# Patient Record
Sex: Male | Born: 1955 | Race: White | Hispanic: No | Marital: Married | Smoking: Never smoker
Health system: Southern US, Community
[De-identification: ages and names within clinical notes are randomized; demographics above are authoritative.]

## PROBLEM LIST (undated history)

## (undated) DIAGNOSIS — R030 Elevated blood-pressure reading, without diagnosis of hypertension: Secondary | ICD-10-CM

## (undated) DIAGNOSIS — Z1211 Encounter for screening for malignant neoplasm of colon: Secondary | ICD-10-CM

## (undated) DIAGNOSIS — Z91018 Allergy to other foods: Secondary | ICD-10-CM

## (undated) DIAGNOSIS — E785 Hyperlipidemia, unspecified: Secondary | ICD-10-CM

## (undated) DIAGNOSIS — E66811 Obesity, class 1: Secondary | ICD-10-CM

## (undated) DIAGNOSIS — E11621 Type 2 diabetes mellitus with foot ulcer: Secondary | ICD-10-CM

## (undated) DIAGNOSIS — R972 Elevated prostate specific antigen [PSA]: Secondary | ICD-10-CM

## (undated) DIAGNOSIS — E118 Type 2 diabetes mellitus with unspecified complications: Secondary | ICD-10-CM

## (undated) DIAGNOSIS — L97509 Non-pressure chronic ulcer of other part of unspecified foot with unspecified severity: Secondary | ICD-10-CM

## (undated) DIAGNOSIS — E669 Obesity, unspecified: Secondary | ICD-10-CM

## (undated) HISTORY — DX: Allergy to other foods: Z91.018

## (undated) HISTORY — DX: Encounter for screening for malignant neoplasm of colon: Z12.11

## (undated) HISTORY — DX: Hyperlipidemia, unspecified: E78.5

## (undated) HISTORY — DX: Elevated prostate specific antigen (PSA): R97.20

## (undated) HISTORY — DX: Obesity, class 1: E66.811

## (undated) HISTORY — DX: Obesity, unspecified: E66.9

## (undated) HISTORY — PX: TONSILLECTOMY: SHX5217

## (undated) HISTORY — DX: Elevated blood-pressure reading, without diagnosis of hypertension: R03.0

## (undated) HISTORY — DX: Non-pressure chronic ulcer of other part of unspecified foot with unspecified severity: L97.509

## (undated) HISTORY — DX: Type 2 diabetes mellitus with foot ulcer: E11.621

## (undated) HISTORY — PX: ROOT CANAL: SHX2363

## (undated) HISTORY — DX: Type 2 diabetes mellitus with unspecified complications: E11.8

---

## 2010-06-13 ENCOUNTER — Encounter: Payer: Self-pay | Admitting: Family Medicine

## 2010-06-13 ENCOUNTER — Ambulatory Visit (INDEPENDENT_AMBULATORY_CARE_PROVIDER_SITE_OTHER): Payer: BC Managed Care – PPO | Admitting: Family Medicine

## 2010-06-13 VITALS — BP 151/94 | HR 84 | Temp 98.0°F | Ht 71.75 in | Wt 227.8 lb

## 2010-06-13 DIAGNOSIS — J209 Acute bronchitis, unspecified: Secondary | ICD-10-CM

## 2010-06-13 NOTE — Progress Notes (Signed)
Office Note 06/14/2010  CC:  Chief Complaint  Patient presents with  . Establish Care    new patient    HPI:  [name]. is a 55 y.o. White male who is here to establish care, discuss cough. Patient's most recent primary MD: Dr. Christell Constant at [facility] in [place], [place]. Old records were not reviewed prior to or during today's visit.  Healthy guy, feeling well until about 1 wk ago developed cough, worse in mornings, some brownish phlegm production at that time, and intermittently through the day has some "wheeziness" and a tight cough that seems to come in little "fits".  Often made worse b/c working a lot around dust lately [employer]).   No nasal congestion or mucous, no ST or HA, no fever or SOB.  Occ "chilled" feeling and mild intermittent ache in upper body, termed "feeling viral" by the patient today.  No hx of seasonal allergic rhinitis or asthmatic bronchitis/asthma.  He doesn't smoke.  No past medical history on file. No past medical problems.  Past Surgical History  Procedure Date  . Root canal   . Tonsillectomy     Family History  Problem Relation Age of Onset  . Diabetes Maternal Grandmother     type 2  . Cancer Maternal Grandfather     all over  . Arthritis Paternal Grandmother   . Heart attack Paternal Grandfather     smoke  . Alcohol abuse Paternal Grandfather     smoker and drinker  . Hodgkin's lymphoma Cousin     History   Social History  . Marital Status: Married    Spouse Name: N/A    Number of Children: N/A  . Years of Education: N/A   Occupational History  . Not on file.   Social History Main Topics  . Smoking status: Never Smoker   . Smokeless tobacco: Former Neurosurgeon    Types: Snuff    Quit date: 01/06/2000   Comment: [place]  . Alcohol Use: No  . Drug Use: No  . Sexually Active: Yes -- Male partner(s)   Other Topics Concern  . Not on file   Social History Narrative   Married, has 2 grown sons.  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  Also works in his [employer].No formal exercise regimen but very active--NOT SEDENTARY.No T/A/Ds.    Outpatient Encounter Prescriptions as of 06/13/2010  Medication Sig Dispense Refill  . multivitamin (THERAGRAN) per tablet Take 1 tablet by mouth daily.        . Omega-3 Fatty Acids (FISH OIL) 1000 MG CAPS Take 1 capsule by mouth daily.        . vitamin C (ASCORBIC ACID) 500 MG tablet Take 500 mg by mouth as needed.          No Known Allergies  ROS Review of Systems  Constitutional: Positive for fatigue (mild, with acute illness only). Negative for fever, chills and appetite change.  HENT: Negative for ear pain, congestion, sore throat, rhinorrhea, sneezing, neck stiffness and dental problem.   Eyes: Negative for discharge, redness and visual disturbance.  Respiratory: Positive for cough and wheezing. Negative for chest tightness and shortness of breath.   Cardiovascular: Negative for chest pain, palpitations and leg swelling.  Gastrointestinal: Negative for nausea, vomiting, abdominal pain, diarrhea and blood in stool.  Genitourinary: Negative for dysuria, urgency, frequency, hematuria, flank pain and difficulty urinating.  Musculoskeletal: Negative for myalgias, back pain, joint swelling and arthralgias.  Skin: Negative  for pallor and rash.  Neurological: Negative for dizziness, speech difficulty, weakness and headaches.  Hematological: Negative for adenopathy. Does not bruise/bleed easily.  Psychiatric/Behavioral: Negative for confusion and sleep disturbance. The patient is not nervous/anxious.     PE; Blood pressure 151/94, pulse 84, temperature 98 F (36.7 C), temperature source Oral, height 5' 11.75" (1.822 m), weight 227 lb 12.8 oz (103.329 kg), SpO2 96.00%. Gen: Alert, well appearing.  Patient is oriented to person, place, time, and situation. HEENT: Scalp without lesions or hair loss.  Ears: EACs clear, normal epithelium.  TMs with good light  reflex and landmarks bilaterally.  Eyes: no injection, icteris, swelling, or exudate.  EOMI, PERRLA. Nose: no drainage or turbinate edema/swelling.  No injection or focal lesion.  Mouth: lips without lesion/swelling.  Oral mucosa pink and moist.  Dentition intact and without obvious caries or gingival swelling.  Oropharynx without erythema, exudate, or swelling.  Neck: supple, ROM full.  Carotids 2+ bilat, without bruit.  No lymphadenopathy, thyromegaly, or mass. Chest: symmetric expansion, nonlabored respirations.  Clear and equal breath sounds in all lung fields. He has some post-exhalation coughing.  CV: RRR, no m/r/g.  Peripheral pulses 2+ and symmetric. EXT: no clubbing, cyanosis, or edema.   Pertinent labs:  none  ASSESSMENT AND PLAN:   Acute bronchitis Discussed usual viral etiology, symptomatic care, rest, fluids. Gave sample of symbicort 160/4.5, take 2 puffs bid until illness resolved. May continue mucinex dm as he is currently doing.  Avoid OTC decongestants.  Recheck bp at drugstore when not recently on decongestant to assure return to normal.  Return if bp remains >140s over 90s (patient reports NEVER having elevated bp in past--"usually low/normal"--and he does admit taking robitussin with decongestant recently).     Return if symptoms worsen or fail to improve.

## 2010-06-13 NOTE — Patient Instructions (Signed)
Please sign authorization for release of records from Dr. Christell Constant ([facility]).

## 2010-06-14 ENCOUNTER — Encounter: Payer: Self-pay | Admitting: Family Medicine

## 2010-06-14 NOTE — Assessment & Plan Note (Signed)
Discussed usual viral etiology, symptomatic care, rest, fluids. Gave sample of symbicort 160/4.5, take 2 puffs bid until illness resolved. May continue mucinex dm as he is currently doing.  Avoid OTC decongestants.  Recheck bp at drugstore when not recently on decongestant to assure return to normal.  Return if bp remains >140s over 90s (patient reports NEVER having elevated bp in past--"usually low/normal"--and he does admit taking robitussin with decongestant recently).

## 2010-08-07 ENCOUNTER — Ambulatory Visit (INDEPENDENT_AMBULATORY_CARE_PROVIDER_SITE_OTHER): Payer: BC Managed Care – PPO | Admitting: Family Medicine

## 2010-08-07 ENCOUNTER — Encounter: Payer: Self-pay | Admitting: Family Medicine

## 2010-08-07 VITALS — BP 147/90 | HR 94 | Temp 98.2°F | Ht 71.75 in | Wt 233.0 lb

## 2010-08-07 DIAGNOSIS — L02619 Cutaneous abscess of unspecified foot: Secondary | ICD-10-CM

## 2010-08-07 DIAGNOSIS — L0390 Cellulitis, unspecified: Secondary | ICD-10-CM

## 2010-08-07 MED ORDER — MUPIROCIN 2 % EX OINT: TOPICAL_OINTMENT | Freq: Three times a day (TID) | CUTANEOUS | Status: AC

## 2010-08-07 MED ORDER — AMOXICILLIN-POT CLAVULANATE 875-125 MG PO TABS: 1.0000 | ORAL_TABLET | Freq: Two times a day (BID) | ORAL | Status: AC

## 2010-08-08 NOTE — Progress Notes (Signed)
OFFICE NOTE  08/08/2010  CC:  Chief Complaint  Patient presents with  . Toe Injury     HPI:   Patient is a 55 y.o. Caucasian male who is here for right great toe pain. Has no history of any chronic medical problems.  Has noted a large painless callus on right great toe the last few months.  Feet wet a lot lately, wearing wet shoes, too---the callus had began to swell and partially fall off so he cut it.  This revealed pinkish/grey skin beneath and there was no pus beneath the portion he cut off.  No foul odor.  Shortly after he cut off a portion of the callus, the great toe began to hurt and swell and turn red (over the last 24h or so).  Says sensation in foot is normal.  No fever, no malaise, no fatigue.  Pertinent PMH:  No hx of MRSA or other skin infection.  No hx of DM 2, no hx of neuropathy.  MEDS;   Outpatient Prescriptions Prior to Visit  Medication Sig Dispense Refill  . multivitamin (THERAGRAN) per tablet Take 1 tablet by mouth daily.        . Omega-3 Fatty Acids (FISH OIL) 1000 MG CAPS Take 1 capsule by mouth daily.        . vitamin C (ASCORBIC ACID) 500 MG tablet Take 500 mg by mouth as needed.          PE: Blood pressure 147/90, pulse 94, temperature 98.2 F (36.8 C), temperature source Oral, height 5' 11.75" (1.822 m), weight 233 lb (105.688 kg), SpO2 97.00%. Gen: Alert, well appearing.  Patient is oriented to person, place, time, and situation. Right foot: great toe with large callus that is quite thick (about 3-4 mm)--the majority of this has been excised by the patient, leaving a pinkish base that has no exudate or odor.  He has erythema, swelling, and mild tenderness to the majority of the great toe and the subtle erythema extends up the medial surface of the foot and the ankle.  I don't feel any fluctuance in the tissue of the great toe.  He moves the toe fine.  IMPRESSION AND PLAN:  Cellulitis Start augmentin 875mg  bid x 10d, bactroban ointment tid, epsom salt  soaks bid, rest. Discussed warning signs of worsening illness. Recheck 4d.     FOLLOW UP:  Return in about 4 days (around 08/11/2010) for f/u toe wound and infection.

## 2010-08-08 NOTE — Assessment & Plan Note (Signed)
Start augmentin 875mg  bid x 10d, bactroban ointment tid, epsom salt soaks bid, rest. Discussed warning signs of worsening illness. Recheck 4d.

## 2010-08-11 ENCOUNTER — Encounter: Payer: Self-pay | Admitting: Family Medicine

## 2010-08-11 ENCOUNTER — Ambulatory Visit (INDEPENDENT_AMBULATORY_CARE_PROVIDER_SITE_OTHER): Payer: BC Managed Care – PPO | Admitting: Family Medicine

## 2010-08-11 VITALS — BP 144/96 | HR 84 | Ht 71.75 in | Wt 233.0 lb

## 2010-08-11 DIAGNOSIS — L0390 Cellulitis, unspecified: Secondary | ICD-10-CM

## 2010-08-11 DIAGNOSIS — L0291 Cutaneous abscess, unspecified: Secondary | ICD-10-CM

## 2010-08-11 NOTE — Progress Notes (Signed)
OFFICE NOTE  08/11/2010  CC:  Chief Complaint  Patient presents with  . Cellulitis    right great toe     HPI: Patient is a 55 y.o. Caucasian male who is here for f/u right great toe infection. Has been on augmentin 3 full days and has noted significant improvement in pain, redness, and swelling.  Also applying bactroban tid and soaking in epsom salt. Tolerating meds fine.  No fever, no malaise.  Pertinent PMH:  Elevated bp w/out dx of HTN  Pertinent Meds: Augmentin and bactroban ointment PE: Blood pressure 144/96, pulse 84, height 5' 11.75" (1.822 m), weight 233 lb (105.688 kg). Gen: Alert, well appearing.  Patient is oriented to person, place, time, and situation. Right great toe: mild erythema laterally but no swelling.  No erythema on remainder of foot or ankle.   Shallow ulceration with moist bed on plantar surface of great toe, some granulation tissue present.  No active exudate, no foul odor. Large callus still present at periphery of wound.  IMPRESSION AND PLAN: Improving soft tissue infection of right great toe. I used a scalpel to cut away the remainder of his callus today. I encouraged him to continue everything he is currently doing. He'll also monitor his bp daily and record and bring to f/u in 1 wk.  FOLLOW UP: 1 wk

## 2010-09-09 ENCOUNTER — Ambulatory Visit (INDEPENDENT_AMBULATORY_CARE_PROVIDER_SITE_OTHER): Payer: BC Managed Care – PPO | Admitting: Family Medicine

## 2010-09-09 ENCOUNTER — Encounter: Payer: Self-pay | Admitting: Family Medicine

## 2010-09-09 VITALS — BP 148/91 | HR 98 | Temp 98.3°F | Wt 230.0 lb

## 2010-09-09 DIAGNOSIS — L089 Local infection of the skin and subcutaneous tissue, unspecified: Secondary | ICD-10-CM

## 2010-09-09 MED ORDER — AMOXICILLIN-POT CLAVULANATE 500-125 MG PO TABS: 1.0000 | ORAL_TABLET | Freq: Three times a day (TID) | ORAL | Status: DC

## 2010-09-10 ENCOUNTER — Other Ambulatory Visit: Payer: Self-pay | Admitting: Family Medicine

## 2010-09-10 ENCOUNTER — Telehealth: Payer: Self-pay | Admitting: Family Medicine

## 2010-09-10 DIAGNOSIS — L089 Local infection of the skin and subcutaneous tissue, unspecified: Secondary | ICD-10-CM

## 2010-09-10 NOTE — Telephone Encounter (Signed)
Pls call pt and tell him I need him to get some blood work prior to his MRI (he needs contrast, so radiology requires recent creatinine). [name] is here this morning but is out this afternoon.  If he wants to get the blood work at a different [place] location this afternoon that is fine.  I'll enter orders.--PM

## 2010-09-10 NOTE — Progress Notes (Signed)
OFFICE NOTE  09/10/2010  CC: No chief complaint on file.    HPI:   Patient is a 55 y.o. Caucasian male who is here for ongoing right great toe ulcer. Feels well, has minimal discomfort with weight bearing/walking. No fever.  The circular ulcer is not draining purulent fluid and there is no foul odor, but this area still is not sealed up. Took a course of augmentin 875mg  bid.  Pertinent PMH:  No DM 2. No hx of MRSA or other soft tissue infection prior to this. No hx of immunodeficiency.  MEDS;   Outpatient Prescriptions Prior to Visit  Medication Sig Dispense Refill  . Omega-3 Fatty Acids (FISH OIL) 1000 MG CAPS Take 1 capsule by mouth daily.        . Pediatric Multiple Vit-C-FA (FRUITY CHEWABLES MULTIVITAMIN PO) Take 1 each by mouth daily.        . vitamin C (ASCORBIC ACID) 500 MG tablet Take 500 mg by mouth as needed.          PE: Blood pressure 148/91, pulse 98, temperature 98.3 F (36.8 C), temperature source Oral, weight 230 lb (104.327 kg). Gen: Alert, well appearing.  Patient is oriented to person, place, time, and situation. LEGS: no edema.   Right foot with minimal focal blanching erythema on dorsal surface of great toe (wear shoe has rubbed lately b/c he has gone back to wearing sneakers instead of flip flops). Nontender.  Thick callous on lateral and plantar surfaces of great toe.  There is a clean, oval ulcer about 1 cm in diameter on plantar surface of right great toe.  No active drainage, no foul odor. I can probe with a Q-tip down about 1cm to pinkish-grey sub Q tissue.    IMPRESSION AND PLAN:  Toe infection Present for 1 month now: improved superficially, but he has a nonhealing ulcer/drainage tract. I feel that he needs more definitive/deeper soft tissue debridement at wound care clinic--referred to [facility] [place] clinic. Also, will check MRI of toe to exclude osteomyelitis. F/u to be determined based on wound clinic findings/procedures/MRI results. Will continue  augmentin 500mg  bid x 7d.     FOLLOW UP:  Return if symptoms worsen or fail to improve.

## 2010-09-10 NOTE — Telephone Encounter (Signed)
I have attempted to contact this patient by phone with the following results: left message to return my call on answering machine (mobile).  Advised to call [place] office, [phone].

## 2010-09-10 NOTE — Assessment & Plan Note (Addendum)
Present for 1 month now: improved superficially, but he has a nonhealing ulcer/drainage tract. I feel that he needs more definitive/deeper soft tissue debridement at wound care clinic--referred to [facility] clinic. Also, will check MRI of toe to exclude osteomyelitis. F/u to be determined based on wound clinic findings/procedures/MRI results. Will continue augmentin 500mg  bid x 7d.

## 2010-09-11 NOTE — Telephone Encounter (Signed)
I spoke to pt and he will come on 09/12/10 for labs.  Appt entered and orders previously entered by Dr. Milinda Cave.

## 2010-09-12 ENCOUNTER — Other Ambulatory Visit (INDEPENDENT_AMBULATORY_CARE_PROVIDER_SITE_OTHER): Payer: BC Managed Care – PPO

## 2010-09-12 ENCOUNTER — Other Ambulatory Visit: Payer: Self-pay | Admitting: Family Medicine

## 2010-09-12 DIAGNOSIS — L089 Local infection of the skin and subcutaneous tissue, unspecified: Secondary | ICD-10-CM

## 2010-09-12 DIAGNOSIS — E118 Type 2 diabetes mellitus with unspecified complications: Secondary | ICD-10-CM

## 2010-09-12 HISTORY — DX: Type 2 diabetes mellitus with unspecified complications: E11.8

## 2010-09-12 LAB — BASIC METABOLIC PANEL
CO2: 24 mEq/L (ref 19–32)
Calcium: 9.2 mg/dL (ref 8.4–10.5)
Creat: 0.78 mg/dL (ref 0.50–1.35)
Sodium: 137 mEq/L (ref 135–145)

## 2010-09-12 LAB — CBC WITH DIFFERENTIAL/PLATELET
Basophils Absolute: 0 10*3/uL (ref 0.0–0.1)
Basophils Relative: 0 % (ref 0–1)
Eosinophils Absolute: 0.1 10*3/uL (ref 0.0–0.7)
Hemoglobin: 14 g/dL (ref 13.0–17.0)
MCH: 27.2 pg (ref 26.0–34.0)
MCHC: 33.4 g/dL (ref 30.0–36.0)
Neutro Abs: 5.8 10*3/uL (ref 1.7–7.7)
Neutrophils Relative %: 61 % (ref 43–77)
Platelets: 223 10*3/uL (ref 150–400)
RDW: 13.9 % (ref 11.5–15.5)

## 2010-09-12 LAB — SEDIMENTATION RATE: Sed Rate: 11 mm/hr (ref 0–16)

## 2010-09-13 ENCOUNTER — Ambulatory Visit (HOSPITAL_BASED_OUTPATIENT_CLINIC_OR_DEPARTMENT_OTHER)
Admission: RE | Admit: 2010-09-13 | Discharge: 2010-09-13 | Disposition: A | Discharge status: Home / Self Care | Payer: BC Managed Care – PPO | Source: Ambulatory Visit | Attending: Family Medicine | Admitting: Family Medicine

## 2010-09-13 ENCOUNTER — Encounter: Payer: Self-pay | Admitting: Family Medicine

## 2010-09-13 DIAGNOSIS — L97509 Non-pressure chronic ulcer of other part of unspecified foot with unspecified severity: Secondary | ICD-10-CM | POA: Insufficient documentation

## 2010-09-13 DIAGNOSIS — L03039 Cellulitis of unspecified toe: Secondary | ICD-10-CM | POA: Insufficient documentation

## 2010-09-13 DIAGNOSIS — R739 Hyperglycemia, unspecified: Secondary | ICD-10-CM | POA: Insufficient documentation

## 2010-09-13 DIAGNOSIS — L02619 Cutaneous abscess of unspecified foot: Secondary | ICD-10-CM | POA: Insufficient documentation

## 2010-09-13 DIAGNOSIS — M948X9 Other specified disorders of cartilage, unspecified sites: Secondary | ICD-10-CM

## 2010-09-13 DIAGNOSIS — L089 Local infection of the skin and subcutaneous tissue, unspecified: Secondary | ICD-10-CM

## 2010-09-13 DIAGNOSIS — M19079 Primary osteoarthritis, unspecified ankle and foot: Secondary | ICD-10-CM | POA: Insufficient documentation

## 2010-09-13 MED ORDER — GADOBENATE DIMEGLUMINE 529 MG/ML IV SOLN
20.0000 mL | Freq: Once | INTRAVENOUS | Status: AC | PRN
  Administered 2010-09-13: 20 mL via INTRAVENOUS

## 2010-09-13 NOTE — Progress Notes (Signed)
Quick Note:  Spoke with Natalie at [facility] and added on HbA1c to labs from 09/12/10 (dx hyperglycemia). Will call pt and notify him of result and discuss f/u.--PM ______

## 2010-09-14 LAB — HEMOGLOBIN A1C
Hgb A1c MFr Bld: 12.6 % — ABNORMAL HIGH (ref <5.7)
Mean Plasma Glucose: 315 mg/dL — ABNORMAL HIGH (ref <117)

## 2010-09-15 ENCOUNTER — Ambulatory Visit (INDEPENDENT_AMBULATORY_CARE_PROVIDER_SITE_OTHER): Payer: BC Managed Care – PPO | Admitting: Family Medicine

## 2010-09-15 ENCOUNTER — Encounter: Payer: Self-pay | Admitting: Family Medicine

## 2010-09-15 VITALS — Wt 227.0 lb

## 2010-09-15 DIAGNOSIS — L089 Local infection of the skin and subcutaneous tissue, unspecified: Secondary | ICD-10-CM

## 2010-09-15 DIAGNOSIS — E119 Type 2 diabetes mellitus without complications: Secondary | ICD-10-CM

## 2010-09-15 MED ORDER — AMOXICILLIN-POT CLAVULANATE 500-125 MG PO TABS: 1.0000 | ORAL_TABLET | Freq: Three times a day (TID) | ORAL | Status: DC

## 2010-09-15 MED ORDER — INSULIN DETEMIR 100 UNIT/ML ~~LOC~~ SOLN: SUBCUTANEOUS | Status: DC

## 2010-09-15 NOTE — Assessment & Plan Note (Signed)
Stable. Finish 5 more days of augmentin--rx done. Keep initial wound clinic appt for later this week (friday).

## 2010-09-15 NOTE — Progress Notes (Signed)
OFFICE NOTE  09/15/2010  CC:  Chief Complaint  Patient presents with  . Diabetes    education     HPI:   Patient is a 55 y.o. Caucasian male who is here to discuss recent dx of DM 2. Random glucose 3d/a was 256.  HbA1c was 12.6%.  He denies polyuria, polydipsia, or polyphagia. He has had a poorly healing right great toe wound, has wound clinic initial appt in 4d.  This wound has improved some since last visit: less red, less swollen. He finishes latest augmentin rx today b/c he's been taking tid but rx says bid.    Discussed general DM 2 concepts today, including monitoring of glucoses, basic dietary recommendations, exercise, and also reviewed basic treatments.  Pertinent PMH:  No significant medical problems except recent toe infection/ulcer. MEDS;   Outpatient Prescriptions Prior to Visit  Medication Sig Dispense Refill  . Omega-3 Fatty Acids (FISH OIL) 1000 MG CAPS Take 1 capsule by mouth daily.        . Pediatric Multiple Vit-C-FA (FRUITY CHEWABLES MULTIVITAMIN PO) Take 1 each by mouth daily.        . vitamin C (ASCORBIC ACID) 500 MG tablet Take 500 mg by mouth as needed.        Marland Kitchen amoxicillin-clavulanate (AUGMENTIN) 500-125 MG per tablet Take 1 tablet (500 mg total) by mouth 3 (three) times daily.  21 tablet  0    PE: Weight 227 lb (102.967 kg). Gen: Alert, well appearing.  Patient is oriented to person, place, time, and situation. Right great toe: nonerythematous, nontender callous on plantar surface, with approx 5mm oval ulceration that extends to subq tissue.  No active drainage, no foul smell.  Sensation intact.  IMPRESSION AND PLAN:  Type II or unspecified type diabetes mellitus without mention of complication, not stated as uncontrolled Some education was done today. Referral for nutritionist ordered today. Recommended starting with bid glucose checks, start diabetic diet, start levemir 10 units SQ qhs and titrate up by 1 unit per night until fasting glucose in  100-110 range. F/u with glucose log in 2 wks. Nurse taught pt how to use glucometer today, random glucose check done (260s), taught pt how to inject insulin---gave 2 levemir pen samples + 14 needles.  Toe infection Stable. Finish 5 more days of augmentin--rx done. Keep initial wound clinic appt for later this week (friday).     FOLLOW UP:  Return in about 2 weeks (around 09/29/2010) for f/u dm 2.

## 2010-09-15 NOTE — Assessment & Plan Note (Signed)
Some education was done today. Referral for nutritionist ordered today. Recommended starting with bid glucose checks, start diabetic diet, start levemir 10 units SQ qhs and titrate up by 1 unit per night until fasting glucose in 100-110 range. F/u with glucose log in 2 wks. Nurse taught pt how to use glucometer today, random glucose check done (260s), taught pt how to inject insulin---gave 2 levemir pen samples + 14 needles.

## 2010-09-19 ENCOUNTER — Encounter (HOSPITAL_BASED_OUTPATIENT_CLINIC_OR_DEPARTMENT_OTHER): Payer: BC Managed Care – PPO | Attending: General Surgery

## 2010-09-19 DIAGNOSIS — E1169 Type 2 diabetes mellitus with other specified complication: Secondary | ICD-10-CM | POA: Insufficient documentation

## 2010-09-19 DIAGNOSIS — L97509 Non-pressure chronic ulcer of other part of unspecified foot with unspecified severity: Secondary | ICD-10-CM | POA: Insufficient documentation

## 2010-09-19 DIAGNOSIS — L03039 Cellulitis of unspecified toe: Secondary | ICD-10-CM | POA: Insufficient documentation

## 2010-09-19 DIAGNOSIS — Z79899 Other long term (current) drug therapy: Secondary | ICD-10-CM | POA: Insufficient documentation

## 2010-09-19 DIAGNOSIS — L02619 Cutaneous abscess of unspecified foot: Secondary | ICD-10-CM | POA: Insufficient documentation

## 2010-09-19 DIAGNOSIS — L84 Corns and callosities: Secondary | ICD-10-CM | POA: Insufficient documentation

## 2010-09-22 ENCOUNTER — Other Ambulatory Visit: Payer: Self-pay | Admitting: *Deleted

## 2010-09-22 DIAGNOSIS — E119 Type 2 diabetes mellitus without complications: Secondary | ICD-10-CM

## 2010-09-22 MED ORDER — BD LANCET DEVICE MISC: Status: DC

## 2010-09-22 MED ORDER — GLUCOSE BLOOD VI STRP: ORAL_STRIP | Status: DC

## 2010-09-22 MED ORDER — GLUCOSE BLOOD VI STRP
ORAL_STRIP | Status: DC
Start: 2010-09-22 — End: 2010-09-22

## 2010-09-22 NOTE — Telephone Encounter (Signed)
Pt presents at office out of test strips and stating that lancet device does not work well.  Advised we would call these in.  Pt request [facility]

## 2010-09-29 ENCOUNTER — Encounter: Payer: Self-pay | Admitting: Family Medicine

## 2010-09-29 ENCOUNTER — Encounter: Payer: BC Managed Care – PPO | Attending: Family Medicine | Admitting: *Deleted

## 2010-09-29 ENCOUNTER — Encounter: Payer: Self-pay | Admitting: *Deleted

## 2010-09-29 ENCOUNTER — Ambulatory Visit (INDEPENDENT_AMBULATORY_CARE_PROVIDER_SITE_OTHER): Payer: BC Managed Care – PPO | Admitting: Family Medicine

## 2010-09-29 DIAGNOSIS — E118 Type 2 diabetes mellitus with unspecified complications: Secondary | ICD-10-CM | POA: Insufficient documentation

## 2010-09-29 DIAGNOSIS — L02619 Cutaneous abscess of unspecified foot: Secondary | ICD-10-CM

## 2010-09-29 DIAGNOSIS — Z713 Dietary counseling and surveillance: Secondary | ICD-10-CM | POA: Insufficient documentation

## 2010-09-29 DIAGNOSIS — L089 Local infection of the skin and subcutaneous tissue, unspecified: Secondary | ICD-10-CM

## 2010-09-29 DIAGNOSIS — E119 Type 2 diabetes mellitus without complications: Secondary | ICD-10-CM | POA: Insufficient documentation

## 2010-09-29 MED ORDER — INSULIN PEN NEEDLE 32G X 6 MM MISC: Status: DC

## 2010-09-29 NOTE — Patient Instructions (Addendum)
Goals:  Follow Diabetes Meal Plan as instructed  Eat 3 meals and 1-3 snacks, every 3-5 hrs  Limit carbohydrate intake to 45-60 grams carbohydrate/meal  Limit carbohydrate intake to 30 grams carbohydrate/snack as needed  Add lean protein foods to meals/snacks  Monitor glucose levels as instructed by your doctor  Aim for 15-30 mins of physical activity daily as able with cast on foot  Call for appointment as needed for your next nutrition visit

## 2010-09-29 NOTE — Progress Notes (Signed)
  Medical Nutrition Therapy:  Appt start time: 11:45 end time:  12:45  Assessment:  Primary concerns today:Newly diagnosed Type 2 Diabetes nutrition counselling    MEDICATIONS: Levemir started at 10 units in evening to be increased by 1 unit per day as needed until FBG is 100-110 mg/dl   DIETARY INTAKE:  Usual eating pattern includes 3 meals and 1-3 snacks per day.  Everyday foods include cereal, milk fruit and variety of all food groups at all meals.     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  Estimated energy needs:2500 - 500 for wt loss of 1# per week = 2000 calories 2000 calories 225 g carbohydrates 150 g protein 56 g fat    Progress Towards Goal(s):  In progress.   Nutritional Diagnosis:  NI-5.8.2 Excessive carbohydrate intake As related to HgA1c of 12% upon diagnosis 1 month ago.  As evidenced by no previous diabetes education..    Intervention:  Nutrition Education.  Handouts given during visit include:  Carb Counting booklet by [employer]    Monitoring/Evaluation:  Dietary intake, exercise, BG Log, and body weight prn. Patient and wife expressed good verbal understanding of carbohydrate as it relates to blood glucose and the importance of spreading carbs evenly throughout the day.

## 2010-09-29 NOTE — Progress Notes (Signed)
OFFICE NOTE  09/29/2010  CC:  Chief Complaint  Patient presents with  . Diabetes    2 week follow up     HPI:   Patient is a 55 y.o. Caucasian male who is here for DM 2 follow up. Has been on lantus now for 2 wks after initial dx of DM 2 last visit, checking one fasting gluc and one 2H PP gluc daily. Glucoses normalizing nicely, currently on 17 U qhs (lowest gluc 109.  Last 4d his range of fastings has been 120-135).  No hypoglycemia.   Adjusting diet nicely but also has first nutritionist visit later today. Unable to exercise much except the exertion he must use to walk w/crutches. Toe improving, has been going to wound care center, finishing up augmentin.  Pertinent PMH:  Newly dx'd DM 2 Slow healing right great toe ulcer and infection  MEDS;   Outpatient Prescriptions Prior to Visit  Medication Sig Dispense Refill  . amoxicillin-clavulanate (AUGMENTIN) 500-125 MG per tablet Take 1 tablet (500 mg total) by mouth 3 (three) times daily.  18 tablet  0  . glucose blood (FREESTYLE LITE) test strip Use to check blood sugar three times a day as directed.  100 each  2  . insulin detemir (LEVEMIR) 100 UNIT/ML injection 10 units subQ every night at bedtime, increase by 1 unit every night until fasting glucose is in the range of 100-110  3 mL  12  . Lancet Devices (B-D LANCET DEVICE) MISC Use as directed to check blood sugars  1 each  1  . Omega-3 Fatty Acids (FISH OIL) 1000 MG CAPS Take 1 capsule by mouth daily.        . vitamin C (ASCORBIC ACID) 500 MG tablet Take 500 mg by mouth as needed.        . Pediatric Multiple Vit-C-FA (FRUITY CHEWABLES MULTIVITAMIN PO) Take 1 each by mouth daily.          PE: Blood pressure 144/86, pulse 87, temperature 98.5 F (36.9 C), temperature source Oral, resp. rate 16, SpO2 97.00%. Gen: Alert, well appearing.  Patient is oriented to person, place, time, and situation. Large cast on RLE.  No further exam today.  IMPRESSION AND PLAN:  Type II or  unspecified type diabetes mellitus without mention of complication, uncontrolled Improving. Continue to titrate Lantus to get fasting gluc to goal range of 100-110 consistently. Nutritionist today.  Gets cast off in a few weeks so will likely increase exercise at that time. Continue bid gluc checks for now. Anticipate switching over from insulin to metformin in 4 wks or so. F/u 1 mo.  Toe infection Improving. No further abx at this time. Ulcer healing going well--continue wound care clinic follow up.   15 minutes spent with patient today, with >50% of that time spent in counseling and care coordination for DM 2 and his toe wound.  FOLLOW UP:  Return in about 1 month (around 10/29/2010) for f/u toe wound and diabetes.

## 2010-09-29 NOTE — Assessment & Plan Note (Signed)
Improving. Continue to titrate Lantus to get fasting gluc to goal range of 100-110 consistently. Nutritionist today.  Gets cast off in a few weeks so will likely increase exercise at that time. Continue bid gluc checks for now. Anticipate switching over from insulin to metformin in 4 wks or so. F/u 1 mo.

## 2010-09-29 NOTE — Assessment & Plan Note (Signed)
Improving. No further abx at this time. Ulcer healing going well--continue wound care clinic follow up.

## 2010-10-02 ENCOUNTER — Ambulatory Visit: Payer: BC Managed Care – PPO | Admitting: *Deleted

## 2010-10-10 ENCOUNTER — Encounter (HOSPITAL_BASED_OUTPATIENT_CLINIC_OR_DEPARTMENT_OTHER): Payer: BC Managed Care – PPO | Attending: General Surgery

## 2010-10-10 DIAGNOSIS — E1169 Type 2 diabetes mellitus with other specified complication: Secondary | ICD-10-CM | POA: Insufficient documentation

## 2010-10-10 DIAGNOSIS — L03039 Cellulitis of unspecified toe: Secondary | ICD-10-CM | POA: Insufficient documentation

## 2010-10-10 DIAGNOSIS — L97509 Non-pressure chronic ulcer of other part of unspecified foot with unspecified severity: Secondary | ICD-10-CM | POA: Insufficient documentation

## 2010-10-10 DIAGNOSIS — Z79899 Other long term (current) drug therapy: Secondary | ICD-10-CM | POA: Insufficient documentation

## 2010-10-10 DIAGNOSIS — L84 Corns and callosities: Secondary | ICD-10-CM | POA: Insufficient documentation

## 2010-10-10 DIAGNOSIS — L02619 Cutaneous abscess of unspecified foot: Secondary | ICD-10-CM | POA: Insufficient documentation

## 2010-10-16 ENCOUNTER — Telehealth: Payer: Self-pay | Admitting: Family Medicine

## 2010-10-16 NOTE — Telephone Encounter (Signed)
2 samples of levemir given to [facility] who gave to patient.

## 2010-10-27 ENCOUNTER — Encounter: Payer: Self-pay | Admitting: Family Medicine

## 2010-10-27 ENCOUNTER — Ambulatory Visit (INDEPENDENT_AMBULATORY_CARE_PROVIDER_SITE_OTHER): Payer: BC Managed Care – PPO | Admitting: Family Medicine

## 2010-10-27 DIAGNOSIS — L089 Local infection of the skin and subcutaneous tissue, unspecified: Secondary | ICD-10-CM

## 2010-10-27 MED ORDER — METFORMIN HCL 500 MG PO TABS: ORAL_TABLET | ORAL | Status: DC

## 2010-10-27 NOTE — Assessment & Plan Note (Signed)
Change to metformin 500mg  bid and d/c lantus.  Therapeutic expectations and side effect profile of medication discussed today.  Patient's questions answered. Titrate metformin to 1000 mg bid after 1 wk if needed.  Also may add back hs levemir as needed in future if max metformin not adequate. Continue bid glucose checks. Will check FLP, HbA1c, and urine microalbumin/cr at next f/u in 71mo. Podiatrist referral for foot/nail care in near future.

## 2010-10-27 NOTE — Assessment & Plan Note (Signed)
Healed.  Just a callous present today. Wear diabetic shoes (currently has them on!).

## 2010-10-27 NOTE — Progress Notes (Signed)
OFFICE NOTE  10/27/2010  CC:  Chief Complaint  Patient presents with  . Follow-up    DM, HTN     HPI:   Patient is a 55 y.o. Caucasian male who is here for f/u DM 2. Glucoses have trended down to near normal fasting : 110-140s.  Lantus max has been 17 U qhs. Saw nutritionist and has started making some adjustments. No symptoms/problems today.  Says he was released from wound care clinic last week b/c his ulcer healed!  Pertinent PMH:  DM 2, dx'd 09/2010 Elevated bp w/out dx of HTN Slow healing right great toe ulcer--now healed.  MEDS;   Outpatient Prescriptions Prior to Visit  Medication Sig Dispense Refill  . glucose blood (FREESTYLE LITE) test strip Use to check blood sugar three times a day as directed.  100 each  2  . insulin detemir (LEVEMIR) 100 UNIT/ML injection Inject 10 Units into the skin at bedtime. 10 units subQ every night at bedtime, increase by 1 unit every night until fasting glucose is in the range of 100-110       . Insulin Pen Needle 32G X 6 MM MISC 1 injection daily  30 each  6  . Lancet Devices (B-D LANCET DEVICE) MISC Use as directed to check blood sugars  1 each  1  . Multiple Vitamins-Minerals (CENTRUM PO) Take 1 tablet by mouth daily.        . Omega-3 Fatty Acids (FISH OIL) 1000 MG CAPS Take 1 capsule by mouth daily.        . vitamin C (ASCORBIC ACID) 500 MG tablet Take 500 mg by mouth as needed.        Marland Kitchen amoxicillin-clavulanate (AUGMENTIN) 500-125 MG per tablet Take 1 tablet (500 mg total) by mouth 3 (three) times daily.  18 tablet  0    PE: Blood pressure 138/80, pulse 76, weight 223 lb (101.152 kg), SpO2 97.00%. Gen: Alert, well appearing.  Patient is oriented to person, place, time, and situation. No further exam today.  IMPRESSION AND PLAN:  Type II or unspecified type diabetes mellitus without mention of complication, uncontrolled Change to metformin 500mg  bid and d/c lantus.  Therapeutic expectations and side effect profile of medication  discussed today.  Patient's questions answered. Titrate metformin to 1000 mg bid after 1 wk if needed.  Also may add back hs levemir as needed in future if max metformin not adequate. Continue bid glucose checks. Will check FLP, HbA1c, and urine microalbumin/cr at next f/u in 70mo. Podiatrist referral for foot/nail care in near future.  Toe infection Healed.  Just a callous present today. Wear diabetic shoes (currently has them on!).     FOLLOW UP:  Return in about 2 months (around 12/27/2010) for f/u DM 2.

## 2010-11-24 ENCOUNTER — Other Ambulatory Visit: Payer: Self-pay | Admitting: Family Medicine

## 2010-11-24 NOTE — Telephone Encounter (Signed)
Follow up on 12/26/10 for DM2.  RX sent to pharm. Pt also left voicemail requesting refill.

## 2010-11-24 NOTE — H&P (Signed)
  NAME:  DEVINE, KLINGEL NO.:  [id number]  MEDICAL RECORD NO.:  [id number]  LOCATION:  FOOT                         FACILITY:  [facility]  PHYSICIAN:  Ardath Sax, M.D.     DATE OF BIRTH:  [birth year]  DATE OF ADMISSION:  09/19/2010 DATE OF DISCHARGE:                             HISTORY & PHYSICAL   [name] is a 55 year old new diabetic who has a family history of type 2 diabetes, but developed a plantar ulcer on his right great toe which prompted him to go to the doctor.  He was noted to have elevated blood sugars and had an A1c of 12.  He has been under treatment now for several weeks because of his ulcer and cellulitis of the toe, they put him on Augmentin and the swelling and cellulitis of the toe has gone down greatly.  He also takes Levemir 10 units subcu every night and his blood sugar today was 160.  His white count is 9000, his hemoglobin 14. This is a very active man who runs a fishing company at [employer] and is on his feet all the feet time.  He is athlete and played football in college and he is quite concerned about his diabetes and his toe. Today, we debrided some callus off.  I felt like we ought to keep him on the Augmentin and have him come back with the idea that we putting on a total contact cast, so we will very give him a diagnosis of diabetic foot ulcer plantar aspect right great toe with surrounding cellulitis. He had a little callus debrided today and he will return next week for a total contact cast.     Ardath Sax, M.D.     PP/MEDQ  D:  09/19/2010  T:  09/19/2010  Job:  [id number]  Electronically Signed by Ardath Sax  on 11/24/2010 03:02:40 PM

## 2010-12-26 ENCOUNTER — Encounter: Payer: Self-pay | Admitting: Family Medicine

## 2010-12-26 ENCOUNTER — Ambulatory Visit (INDEPENDENT_AMBULATORY_CARE_PROVIDER_SITE_OTHER): Payer: BC Managed Care – PPO | Admitting: Family Medicine

## 2010-12-26 DIAGNOSIS — Z23 Encounter for immunization: Secondary | ICD-10-CM

## 2010-12-26 DIAGNOSIS — R030 Elevated blood-pressure reading, without diagnosis of hypertension: Secondary | ICD-10-CM

## 2010-12-26 DIAGNOSIS — E119 Type 2 diabetes mellitus without complications: Secondary | ICD-10-CM

## 2010-12-26 DIAGNOSIS — IMO0001 Reserved for inherently not codable concepts without codable children: Secondary | ICD-10-CM

## 2010-12-26 DIAGNOSIS — I10 Essential (primary) hypertension: Secondary | ICD-10-CM | POA: Insufficient documentation

## 2010-12-26 HISTORY — DX: Elevated blood-pressure reading, without diagnosis of hypertension: R03.0

## 2010-12-26 LAB — MICROALBUMIN / CREATININE URINE RATIO: Microalb Creat Ratio: 1.8 mg/g (ref 0.0–30.0)

## 2010-12-26 LAB — LIPID PANEL
LDL Cholesterol: 105 mg/dL — ABNORMAL HIGH (ref 0–99)
Total CHOL/HDL Ratio: 4

## 2010-12-26 LAB — COMPREHENSIVE METABOLIC PANEL
ALT: 33 U/L (ref 0–53)
AST: 27 U/L (ref 0–37)
Albumin: 4.1 g/dL (ref 3.5–5.2)
Calcium: 9 mg/dL (ref 8.4–10.5)
Chloride: 103 mEq/L (ref 96–112)
Potassium: 3.8 mEq/L (ref 3.5–5.1)
Sodium: 141 mEq/L (ref 135–145)
Total Protein: 7.4 g/dL (ref 6.0–8.3)

## 2010-12-26 NOTE — Assessment & Plan Note (Signed)
Likely secondary to frequent use of OTC decongestants lately due to his flu-like illness. Warned pt to stay away from any OTC cold med that has the work DECONGESTANT on it. REcheck of bp today in exam room after 15 min was 142/90 in right arm.

## 2010-12-26 NOTE — Progress Notes (Addendum)
OFFICE NOTE  12/26/2010  CC:  Chief Complaint  Patient presents with  . Diabetes    follow up     HPI:   Patient is a 55 y.o. Caucasian male who is here for 33mo DM 2 follow up. Last visit we switched him over from levemir insulin at bedtime to metformin 500 mg bid. Has not increased to 1000mg  bid despite glucoses still being 140s typically when fasting, sometimes higher later in the day.  He has had a flu-like illness the last week or so, feeling much better the last 2d: had fever, cough, body aches, fatigue.  Wife had it before him. He has not had flu vaccine this season--says a nurse at [facility] recommended against it when he was there once for his toe infection. He is fasting today.  Says his right great toe continues to improve: some black callus/eschar is still slowly sloughing off.  Reports chronic feeling of tingling in tips of both feet: no change.  No vision complaints.  Has not seen eye MD for diab retinpthy screen since dx of DM.   Pertinent PMH:  Past Medical History  Diagnosis Date  . DM type 2 (diabetes mellitus, type 2) 09/12/10    HbA1c 12.6% at the time of dx.  Hx of slow-healing toe wound (healed 10/2010) Hx of elevated bp w/out dx of HTN  MEDS;  No longer taking levemir. Outpatient Prescriptions Prior to Visit  Medication Sig Dispense Refill  . glucose blood (FREESTYLE LITE) test strip Use to check blood sugar three times a day as directed.  100 each  2  . Lancet Devices (B-D LANCET DEVICE) MISC Use as directed to check blood sugars  1 each  1  . metFORMIN (GLUCOPHAGE) 500 MG tablet TAKE ONE TO TWO TABLETS BY MOUTH TWICE DAILY FOR DIABETES  60 tablet  1  . Multiple Vitamins-Minerals (CENTRUM PO) Take 1 tablet by mouth daily.        . Omega-3 Fatty Acids (FISH OIL) 1000 MG CAPS Take 1 capsule by mouth daily.        . vitamin C (ASCORBIC ACID) 500 MG tablet Take 500 mg by mouth as needed.        . insulin detemir (LEVEMIR) 100 UNIT/ML injection Inject 10  Units into the skin at bedtime. 10 units subQ every night at bedtime, increase by 1 unit every night until fasting glucose is in the range of 100-110       . Insulin Pen Needle 32G X 6 MM MISC 1 injection daily  30 each  6    PE: Blood pressure 153/98, pulse 84, weight 221 lb (100.245 kg). VS: noted--bp initially up, recheck 142/90 --manual in right arm. Gen: alert, NAD, NONTOXIC APPEARING. HEENT: eyes without injection, drainage, or swelling.  Ears: EACs clear, TMs with normal light reflex and landmarks.  Nose: Clear rhinorrhea, with some dried, crusty exudate adherent to mildly injected mucosa.  No purulent d/c.  No paranasal sinus TTP.  No facial swelling.  Throat and mouth without focal lesion.  No pharyngial swelling, erythema, or exudate.   Neck: supple, no LAD.   LUNGS: CTA bilat, nonlabored resps.   CV: RRR, no m/r/g. EXT: no c/c/e SKIN: no rash  LAB: none today  IMPRESSION AND PLAN:  Type II or unspecified type diabetes mellitus without mention of complication, uncontrolled Control fair lately.  He's done a good job with diet.  Trying to build exercise up but admits he's taking it slow b/c  of his toe (wants it to heal more completely first). Check HbA1c, urine microalb/cr, CMET, FLP, and TSH today. He'll check with his insurance about choosing a specific eye MD and when we get this info we'll make referral for diab retin screening. F/u 65mo.  Elevated blood pressure reading without diagnosis of hypertension Likely secondary to frequent use of OTC decongestants lately due to his flu-like illness. Warned pt to stay away from any OTC cold med that has the work DECONGESTANT on it. REcheck of bp today in exam room after 15 min was 142/90 in right arm.   Resolving flu-like illness.  Flu vaccine given IM today.  Next f/u visit I'll discuss with him the general recommendation that he should be on a daily dose of ASA 81mg .  FOLLOW UP:  Return in about 3 months (around 03/26/2011)  for f/u DM.

## 2010-12-26 NOTE — Assessment & Plan Note (Signed)
Control fair lately.  He's done a good job with diet.  Trying to build exercise up but admits he's taking it slow b/c of his toe (wants it to heal more completely first). Check HbA1c, urine microalb/cr, CMET, FLP, and TSH today. He'll check with his insurance about choosing a specific eye MD and when we get this info we'll make referral for diab retin screening. F/u 31mo.

## 2011-01-06 ENCOUNTER — Encounter: Payer: Self-pay | Admitting: Family Medicine

## 2011-01-12 ENCOUNTER — Other Ambulatory Visit: Payer: Self-pay | Admitting: Family Medicine

## 2011-01-12 MED ORDER — METFORMIN HCL 500 MG PO TABS: ORAL_TABLET | ORAL | Status: DC

## 2011-01-12 NOTE — Progress Notes (Signed)
Ok.  I sent 90 d rx for metformin 500mg  at the 1 and 1/2 tab bid dosing sent to [facility] in [place] just now.  thx

## 2011-02-06 ENCOUNTER — Encounter (HOSPITAL_BASED_OUTPATIENT_CLINIC_OR_DEPARTMENT_OTHER): Payer: BC Managed Care – PPO | Attending: General Surgery

## 2011-02-06 DIAGNOSIS — L97509 Non-pressure chronic ulcer of other part of unspecified foot with unspecified severity: Secondary | ICD-10-CM | POA: Insufficient documentation

## 2011-02-06 DIAGNOSIS — E1169 Type 2 diabetes mellitus with other specified complication: Secondary | ICD-10-CM | POA: Insufficient documentation

## 2011-02-06 NOTE — Progress Notes (Signed)
[facility]  NAME:  [name], [name] NO.:  [id number]  MEDICAL RECORD NO.:  [id number]      DATE OF BIRTH:  16-Jul-1955  PHYSICIAN:  Ardath Sax, M.D.           VISIT DATE:                                  OFFICE VISIT   [name], known as [name] is a 56 year old gentleman who is a fairly recent diabetic, he was here about 4-5 months ago, and he developed a diabetic foot ulcer on his right great toe, medially on the DIP joint.  This healed up very readily when he was put in a total contact cast.  He was just diagnosed as a diabetic at that time, and he has been taking care of by Dr. Milinda Cave and he is presently on Glucophage and a diabetic diet.  He has been able to lower his A1c considerably. When he was here last time, it was 12.5, and he had a blood sugar around 300, now his blood sugars are normal, now that he has been educated how to take care of diabetes and he returns this time because he has a recurrence of his diabetic foot ulcer in the same area.  I debrided callus off.  There was a little white area about 5 mm in diameter that I think is macerated skin.  I do not think it goes into the subcutaneous tissue.  I just debrided off callus and I think that we should get this healed pretty good with conservative management, today we put on some collagen and with a donut to offload.  He has already got diabetic shoes, but we really going to offload him and maybe put him in a total contact cast again.  He will come back in a week.     Ardath Sax, M.D.     PP/MEDQ  D:  02/06/2011  T:  02/06/2011  Job:  [id number]

## 2011-03-06 ENCOUNTER — Encounter (HOSPITAL_BASED_OUTPATIENT_CLINIC_OR_DEPARTMENT_OTHER): Payer: BC Managed Care – PPO | Attending: General Surgery

## 2011-03-06 DIAGNOSIS — E1169 Type 2 diabetes mellitus with other specified complication: Secondary | ICD-10-CM | POA: Insufficient documentation

## 2011-03-06 DIAGNOSIS — L97509 Non-pressure chronic ulcer of other part of unspecified foot with unspecified severity: Secondary | ICD-10-CM | POA: Insufficient documentation

## 2011-03-27 ENCOUNTER — Encounter: Payer: Self-pay | Admitting: Family Medicine

## 2011-03-27 ENCOUNTER — Ambulatory Visit (INDEPENDENT_AMBULATORY_CARE_PROVIDER_SITE_OTHER): Payer: BC Managed Care – PPO | Admitting: Family Medicine

## 2011-03-27 VITALS — BP 151/95 | HR 88 | Temp 97.9°F | Ht 72.0 in | Wt 216.0 lb

## 2011-03-27 DIAGNOSIS — E119 Type 2 diabetes mellitus without complications: Secondary | ICD-10-CM

## 2011-03-27 DIAGNOSIS — R030 Elevated blood-pressure reading, without diagnosis of hypertension: Secondary | ICD-10-CM

## 2011-03-27 LAB — LIPID PANEL
Cholesterol: 189 mg/dL (ref 0–200)
LDL Cholesterol: 127 mg/dL — ABNORMAL HIGH (ref 0–99)
Triglycerides: 69 mg/dL (ref 0.0–149.0)
VLDL: 13.8 mg/dL (ref 0.0–40.0)

## 2011-03-27 MED ORDER — BD LANCET DEVICE MISC: Status: AC

## 2011-03-27 MED ORDER — GLUCOSE BLOOD VI STRP: ORAL_STRIP | Status: DC

## 2011-03-27 NOTE — Assessment & Plan Note (Addendum)
Glucose monitoring at home show good control on 1000mg  bid. Check HbA1c today as well as FLP. He declined advanced lipid testing--"I don't want to go looking for anything extra right now doc". I have to introduce things one at a time with [name], so at future visits I'll need to reiterate the need for him to get his first annual diab retinpthy screening exam.  Also, will recommend pneumovax and daily 81mg  ASA. Foot healing up well now and he's happy with this and will be getting a diabetic shoe.

## 2011-03-27 NOTE — Progress Notes (Signed)
OFFICE NOTE  03/27/2011  CC:  Chief Complaint  [name] presents with  . Diabetes    follow u     HPI: [name] is a 56 y.o. Caucasian male who is here for f/u DM 2. After last visit he did eventually titrate his metformin up to 2 tabs bid. Fasting glucoses avg 118-120.  Two hour PP avg 130-140. (per pt report--no log avail for review today). Denies acute complaint. For the last few months has been back at the wound center due to ongoing right great toe wound --was recasted, some more debridement was done intermittently, a piece of shell was ultimately found. Now he is out of cast, has it wrapped, is waiting on diabetic shoe. 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 BP checks at wound care center and home have been consistently 130s over 80s per his report.  Pertinent PMH:  Past Medical History  Diagnosis Date  . DM type 2 (diabetes mellitus, type 2) 09/12/10    HbA1c 12.6% at the time of dx.  . Elevated blood pressure reading without diagnosis of hypertension 12/26/2010  . Toe ulcer due to DM fall 2012    Right great toe   Past surgical, social, and family history reviewed and no changes noted since last office visit.  MEDS:  Outpatient Prescriptions Prior to Visit  Medication Sig Dispense Refill  . acetaminophen (TYLENOL) 500 MG tablet Take 500 mg by mouth every 6 (six) hours as needed.        . cetirizine (ZYRTEC) 10 MG tablet Take 10 mg by mouth daily.        . Multiple Vitamins-Minerals (CENTRUM PO) Take 1 tablet by mouth daily.        . Omega-3 Fatty Acids (FISH OIL) 1000 MG CAPS Take 1 capsule by mouth daily.        . vitamin C (ASCORBIC ACID) 500 MG tablet Take 500 mg by mouth as needed.        Marland Kitchen glucose blood (FREESTYLE LITE) test strip Use to check blood sugar three times a day as directed.  100 each  2  . Lancet Devices (B-D LANCET DEVICE) MISC Use as directed to check blood sugars  1  each  1  . metFORMIN (GLUCOPHAGE) 500 MG tablet 1 and 1/2 tabs po bid  225 tablet  2  . Pseudoephedrine-APAP-DM (DAYQUIL PO) Take by mouth as directed.        *metformin above is actually 500mg , 2 bid  PE: Blood pressure 151/95, pulse 88, temperature 97.9 F (36.6 C), temperature source Temporal, height 6' (1.829 m), weight 216 lb (97.977 kg). Gen: Alert, well appearing.  [name] is oriented to person, place, time, and situation. Repeat BP in exam room 142/88.  No further exam today.  IMPRESSION AND PLAN:  Type II or unspecified type diabetes mellitus without mention of complication, not stated as uncontrolled Glucose monitoring at home show good control on 1000mg  bid. Check HbA1c today as well as FLP. He declined advanced lipid testing--"I don't want to go looking for anything extra right now doc". I have to introduce things one at a time with [name], so at future visits I'll need to reiterate the need for him to get his first annual diab retinpthy screening exam.  Also, will recommend pneumovax and daily 81mg  ASA. Foot healing up well now and he's happy with this and will be getting  a diabetic shoe.  Elevated blood pressure reading without diagnosis of hypertension Slight white coat HTN, continue to monitor for now, eat low Na diet, increase exercise when foot allows.    FOLLOW UP: 30mo

## 2011-03-27 NOTE — Assessment & Plan Note (Signed)
Slight white coat HTN, continue to monitor for now, eat low Na diet, increase exercise when foot allows.

## 2011-04-13 ENCOUNTER — Encounter: Payer: Self-pay | Admitting: Family Medicine

## 2011-04-13 ENCOUNTER — Ambulatory Visit (INDEPENDENT_AMBULATORY_CARE_PROVIDER_SITE_OTHER): Payer: BC Managed Care – PPO | Admitting: Family Medicine

## 2011-04-13 DIAGNOSIS — B029 Zoster without complications: Secondary | ICD-10-CM | POA: Insufficient documentation

## 2011-04-13 MED ORDER — PREDNISONE 20 MG PO TABS: ORAL_TABLET | ORAL | Status: DC

## 2011-04-13 MED ORDER — ACYCLOVIR 800 MG PO TABS: ORAL_TABLET | ORAL | Status: DC

## 2011-04-13 NOTE — Assessment & Plan Note (Signed)
Acyclovir 800mg  5 times per day x 10d. Prednisone 40mg  qd x 5d, then 20mg  qd x 5d. Ibuprofen 600mg  tid with food or tylenol 1000mg  tid prn. Call if pain med needs exceed what these OTC meds provide.

## 2011-04-13 NOTE — Progress Notes (Signed)
OFFICE NOTE  04/13/2011  CC:  Chief Complaint  Patient presents with  . Rash    under left breast, ? shingles, since Thursday     HPI: Patient is a 56 y.o. Caucasian male who is here for rash. Onset 4-5 days ago in left upper quadrant area of abdomen.  No pain/paresthesias preceded the rash but the rash itself has been painful.  Has not applied anything to it.  Made better by nothing-(no pain meds or topicals tried).  Pain made worse by touching it. No fevers, no malaise.  No other areas of skin with any rash.   Pertinent PMH:  Past Medical History  Diagnosis Date  . DM type 2 (diabetes mellitus, type 2) 09/12/10    HbA1c 12.6% at the time of dx.  . Elevated blood pressure reading without diagnosis of hypertension 12/26/2010  . Toe ulcer due to DM fall 2012    Right great toe  No prior hx of shingles.  MEDS:  Outpatient Prescriptions Prior to Visit  Medication Sig Dispense Refill  . acetaminophen (TYLENOL) 500 MG tablet Take 500 mg by mouth every 6 (six) hours as needed.        . cetirizine (ZYRTEC) 10 MG tablet Take 10 mg by mouth daily.        Marland Kitchen glucose blood (FREESTYLE LITE) test strip Use to check blood sugar twice daily as directed  60 each  5  . Lancet Devices (B-D LANCET DEVICE) MISC Use as directed to check blood sugars 2 times per day  60 each  5  . metFORMIN (GLUCOPHAGE) 500 MG tablet Take 2 tablets by mouth twice daily      . Multiple Vitamins-Minerals (CENTRUM PO) Take 1 tablet by mouth daily.        . Omega-3 Fatty Acids (FISH OIL) 1000 MG CAPS Take 1 capsule by mouth daily.        . vitamin C (ASCORBIC ACID) 500 MG tablet Take 500 mg by mouth as needed.          PE: Blood pressure 157/97, pulse 89, temperature 97.6 F (36.4 C), temperature source Temporal, height 6' (1.829 m), weight 223 lb (101.152 kg). Gen: Alert, well appearing.  Patient is oriented to person, place, time, and situation. Left upper quadrant area with 6 cm oval patch of vesicular rash,  several of these have erupted and have started crusting.  IMPRESSION AND PLAN:  Herpes zoster Acyclovir 800mg  5 times per day x 10d. Prednisone 40mg  qd x 5d, then 20mg  qd x 5d. Ibuprofen 600mg  tid with food or tylenol 1000mg  tid prn. Call if pain med needs exceed what these OTC meds provide.   Return in about 2 mo after this is completely cleared up and get zostavax rx.  I reminded him today that I want him on a daily 81mg  ASA as per recommendations for primary CV prevention in a 56 y/o diabetic.  He said he'll start this ASAP.   FOLLOW UP: prn

## 2011-05-14 ENCOUNTER — Other Ambulatory Visit: Payer: Self-pay | Admitting: *Deleted

## 2011-05-14 DIAGNOSIS — E119 Type 2 diabetes mellitus without complications: Secondary | ICD-10-CM

## 2011-05-14 MED ORDER — GLUCOSE BLOOD VI STRP: ORAL_STRIP | Status: AC

## 2011-05-14 MED ORDER — METFORMIN HCL 500 MG PO TABS: 1000.0000 mg | ORAL_TABLET | Freq: Two times a day (BID) | ORAL | Status: DC

## 2011-05-14 NOTE — Telephone Encounter (Signed)
Faxed refill request received from pharmacy for metformin and test strips Last seen on 03/27/11 Follow up 06/26/11 RX sent.

## 2011-06-26 ENCOUNTER — Encounter: Payer: Self-pay | Admitting: Family Medicine

## 2011-06-26 ENCOUNTER — Ambulatory Visit (INDEPENDENT_AMBULATORY_CARE_PROVIDER_SITE_OTHER): Payer: BC Managed Care – PPO | Admitting: Family Medicine

## 2011-06-26 VITALS — BP 159/96 | HR 84 | Ht 72.0 in | Wt 224.0 lb

## 2011-06-26 DIAGNOSIS — E785 Hyperlipidemia, unspecified: Secondary | ICD-10-CM

## 2011-06-26 DIAGNOSIS — L0390 Cellulitis, unspecified: Secondary | ICD-10-CM

## 2011-06-26 DIAGNOSIS — I10 Essential (primary) hypertension: Secondary | ICD-10-CM

## 2011-06-26 DIAGNOSIS — E119 Type 2 diabetes mellitus without complications: Secondary | ICD-10-CM

## 2011-06-26 DIAGNOSIS — IMO0001 Reserved for inherently not codable concepts without codable children: Secondary | ICD-10-CM

## 2011-06-26 LAB — COMPREHENSIVE METABOLIC PANEL
ALT: 33 U/L (ref 0–53)
AST: 26 U/L (ref 0–37)
Calcium: 9.6 mg/dL (ref 8.4–10.5)
Chloride: 102 mEq/L (ref 96–112)
Creatinine, Ser: 0.7 mg/dL (ref 0.4–1.5)

## 2011-06-26 LAB — LIPID PANEL
LDL Cholesterol: 114 mg/dL — ABNORMAL HIGH (ref 0–99)
Total CHOL/HDL Ratio: 4
Triglycerides: 169 mg/dL — ABNORMAL HIGH (ref 0.0–149.0)

## 2011-06-26 MED ORDER — CEPHALEXIN 500 MG PO CAPS: ORAL_CAPSULE | ORAL | Status: DC

## 2011-06-26 NOTE — Progress Notes (Signed)
OFFICE VISIT  06/28/2011   CC:  Chief Complaint  Patient presents with  . Follow-up    DM     HPI:    Patient is a 56 y.o. Caucasian male who presents for 3 mo f/u DM 2. Glucoses went up to 150s to 160s for the 2-3 week period around when he was treated with steroids and antiviral meds for shingles 04/13/11. They have since returned to 110-120 fasting and 140s-150s PP. He is not restricting his diet quite as much lately, getting a little more comfortable with food choices while still keeping sugars good.  Shingles rash totally resolved and he only has an occasional fleeting pain in the area.  He has not started a regular workout program yet, has been busy with business.  Eating more walnuts, added chromium and magnesium. Has noted left foot great toe redness and swelling at medial nail border, no pain.  No fevers.  No drainage.  Past Medical History  Diagnosis Date  . DM type 2 (diabetes mellitus, type 2) 09/12/10    HbA1c 12.6% at the time of dx.  . Elevated blood pressure reading without diagnosis of hypertension 12/26/2010  . Toe ulcer due to DM fall 2012    Right great toe    Past Surgical History  Procedure Date  . Root canal   . Tonsillectomy     Outpatient Prescriptions Prior to Visit  Medication Sig Dispense Refill  . acetaminophen (TYLENOL) 500 MG tablet Take 500 mg by mouth every 6 (six) hours as needed.        Marland Kitchen aspirin 81 MG tablet Take 81 mg by mouth daily.      . Cholecalciferol (VITAMIN D3) 1000 UNITS CAPS Take 1 capsule by mouth daily.      Marland Kitchen glucose blood (FREESTYLE LITE) test strip Use to check blood sugar twice daily as directed  100 each  5  . Lancet Devices (B-D LANCET DEVICE) MISC Use as directed to check blood sugars 2 times per day  60 each  5  . metFORMIN (GLUCOPHAGE) 500 MG tablet Take 2 tablets (1,000 mg total) by mouth 2 (two) times daily with a meal.  120 tablet  2  . Multiple Vitamins-Minerals (CENTRUM PO) Take 1 tablet by mouth daily.        .  Multiple Vitamins-Minerals (ZINC PO) Take 1 tablet by mouth daily.      . Omega-3 Fatty Acids (FISH OIL) 1000 MG CAPS Take 1 capsule by mouth daily.        . vitamin C (ASCORBIC ACID) 500 MG tablet Take 500 mg by mouth as needed.        Marland Kitchen acyclovir (ZOVIRAX) 800 MG tablet 1 tab po 5 times per day for 10 days  50 tablet  0  . cetirizine (ZYRTEC) 10 MG tablet Take 10 mg by mouth daily.        . predniSONE (DELTASONE) 20 MG tablet 2 tabs po qd x 5d, then 1 tab po qd x 5d  15 tablet  0    No Known Allergies  ROS As per HPI  PE: Blood pressure 159/96, pulse 84, height 6' (1.829 m), weight 224 lb (101.606 kg). Gen: Alert, well appearing.  Patient is oriented to person, place, time, and situation. CV: RRR, no m/r/g.   LUNGS: CTA bilat, nonlabored resps, good aeration in all lung fields. FEET: left great toe with pinkish, blanching erythema and mild swelling along posteromedial nail border.  Nontender.  No drainage.  Sensation intact.  Cap refill brisk.  Nails appear normal.  LABS:  none  IMPRESSION AND PLAN:  Cellulitis Left great toe (infection vs ingrown toenail):  Treat with keflex 500mg  tid x 10d, return if not completely resolved with this.  Type II or unspecified type diabetes mellitus without mention of complication, not stated as uncontrolled Pretty stable.  Check HbA1c today. He'll call an ophthalmologist for diab retin screen (his initial one). Continue diabetic diet + current metformin dosing.  Hyperlipidemia LDL was not at goal of <100 in March 2013 and he wanted to put off med and try more dietary/lifestyle changes. We'll recheck FLP today.  White coat hypertension He reiterates that all outside bp checks are well within normal range.    FOLLOW UP: Return in about 4 months (around 10/26/2011) for f/u DM 2, hyperlipidemia.

## 2011-06-28 DIAGNOSIS — E785 Hyperlipidemia, unspecified: Secondary | ICD-10-CM | POA: Insufficient documentation

## 2011-06-28 NOTE — Assessment & Plan Note (Signed)
Left great toe (infection vs ingrown toenail):  Treat with keflex 500mg  tid x 10d, return if not completely resolved with this.

## 2011-06-28 NOTE — Assessment & Plan Note (Signed)
LDL was not at goal of <100 in March 2013 and he wanted to put off med and try more dietary/lifestyle changes. We'll recheck FLP today.

## 2011-06-28 NOTE — Assessment & Plan Note (Signed)
Pretty stable.  Check HbA1c today. He'll call an ophthalmologist for diab retin screen (his initial one). Continue diabetic diet + current metformin dosing.

## 2011-06-28 NOTE — Assessment & Plan Note (Signed)
He reiterates that all outside bp checks are well within normal range.

## 2011-09-22 ENCOUNTER — Other Ambulatory Visit: Payer: Self-pay | Admitting: Family Medicine

## 2011-09-23 NOTE — Telephone Encounter (Signed)
eScribe request for refill on METFORMIN Last filled - 05/14/14, #120 X 2 Last seen on - 06/26/11 Follow up - 10/23/11 RX sent

## 2011-10-23 ENCOUNTER — Ambulatory Visit: Payer: BC Managed Care – PPO | Admitting: Family Medicine

## 2011-10-26 ENCOUNTER — Telehealth: Payer: Self-pay | Admitting: *Deleted

## 2011-10-26 NOTE — Telephone Encounter (Signed)
I have attempted to contact this patient by phone with the following results: left message to return my call on answering machine (mobile). RE: ASA therapy, missed appt

## 2011-10-28 NOTE — Telephone Encounter (Signed)
Pt rescheduled to Friday at 1315.  Pt is currently on ASA.  Pt received same paperwork.

## 2011-10-30 ENCOUNTER — Ambulatory Visit (INDEPENDENT_AMBULATORY_CARE_PROVIDER_SITE_OTHER): Payer: BC Managed Care – PPO | Admitting: Family Medicine

## 2011-10-30 ENCOUNTER — Encounter: Payer: Self-pay | Admitting: Family Medicine

## 2011-10-30 VITALS — BP 150/96 | HR 77 | Temp 97.4°F | Ht 72.0 in | Wt 220.0 lb

## 2011-10-30 DIAGNOSIS — I10 Essential (primary) hypertension: Secondary | ICD-10-CM

## 2011-10-30 DIAGNOSIS — E785 Hyperlipidemia, unspecified: Secondary | ICD-10-CM

## 2011-10-30 DIAGNOSIS — E119 Type 2 diabetes mellitus without complications: Secondary | ICD-10-CM

## 2011-10-30 DIAGNOSIS — Z23 Encounter for immunization: Secondary | ICD-10-CM

## 2011-10-30 DIAGNOSIS — IMO0001 Reserved for inherently not codable concepts without codable children: Secondary | ICD-10-CM

## 2011-10-30 LAB — LIPID PANEL
HDL: 47.6 mg/dL (ref >39.00)
Total CHOL/HDL Ratio: 4
Triglycerides: 61 mg/dL (ref 0.0–149.0)
VLDL: 12.2 mg/dL (ref 0.0–40.0)

## 2011-10-30 NOTE — Progress Notes (Signed)
OFFICE NOTE  10/30/2011  CC:  Chief Complaint  Patient presents with  . Follow-up    DM, lipids     HPI: Patient is a 56 y.o. Caucasian male who is here for 4 mo f/u DM 2 and hyperlipidemia.   Feeling very well.  Feet feel so good he has begun playing golf again. Fasting glucose monitoring shows avg 120s, no postprandial numbers to report. He has changed his diet to fit a more diabetic-type diet since our last visit. He has been very busy/physically active with working on a new home but he has not been doing any exercise regimen per se.  Pertinent PMH:  Past Medical History  Diagnosis Date  . DM type 2 (diabetes mellitus, type 2) 09/12/10    HbA1c 12.6% at the time of dx.  . Elevated blood pressure reading without diagnosis of hypertension 12/26/2010  . Toe ulcer due to DM fall 2012    Right great toe    MEDS:  Outpatient Prescriptions Prior to Visit  Medication Sig Dispense Refill  . acetaminophen (TYLENOL) 500 MG tablet Take 500 mg by mouth every 6 (six) hours as needed.        Marland Kitchen aspirin 81 MG tablet Take 81 mg by mouth daily.      . Cholecalciferol (VITAMIN D3) 1000 UNITS CAPS Take 1 capsule by mouth daily.      Marland Kitchen glucose blood (FREESTYLE LITE) test strip Use to check blood sugar twice daily as directed  100 each  5  . Lancet Devices (B-D LANCET DEVICE) MISC Use as directed to check blood sugars 2 times per day  60 each  5  . metFORMIN (GLUCOPHAGE) 500 MG tablet TAKE TWO TABLETS BY MOUTH TWO TIMES DAILY WITH A MEAL  120 tablet  5  . Multiple Vitamins-Minerals (CENTRUM PO) Take 1 tablet by mouth daily.        . Multiple Vitamins-Minerals (ZINC PO) Take 1 tablet by mouth daily.      . Omega-3 Fatty Acids (FISH OIL) 1000 MG CAPS Take 1 capsule by mouth daily.        . vitamin C (ASCORBIC ACID) 500 MG tablet Take 500 mg by mouth as needed.        . cephALEXin (KEFLEX) 500 MG capsule 1 tab po tid X 10 days  30 capsule  0    PE: Blood pressure 150/96, pulse 77, temperature  97.4 F (36.3 C), temperature source Temporal, height 6' (1.829 m), weight 220 lb (99.791 kg), SpO2 96.00%. Gen: Alert, well appearing.  Patient is oriented to person, place, time, and situation. ENT:  Eyes: no injection, icteris, swelling, or exudate.  EOMI, PERRLA. Nose: no drainage or turbinate edema/swelling.  No injection or focal lesion.  Mouth: lips without lesion/swelling.  Oral mucosa pink and moist.  Dentition intact and without obvious caries or gingival swelling.  Oropharynx without erythema, exudate, or swelling.  Neck - No masses or thyromegaly or limitation in range of motion CV: RRR, no m/r/g.   LUNGS: CTA bilat, nonlabored resps, good aeration in all lung fields.   IMPRESSION AND PLAN:  Type II or unspecified type diabetes mellitus without mention of complication, not stated as uncontrolled Good control as per his report of fasting cbgs. Feet feeling back to normal! Diet is fine but he needs to work on exercise regimen. Check HbA1c today. Refer to optometry today for his initial diabetic retinopathy screening exam. Pneumovax and influenza given IM today.  Hyperlipidemia He has once  again postponed any consideration of medication for this, wanting to wait 4 more months now to get his exercise regimen going. Will recheck FLP today since he has fasted today--we'll see how his latest dietary adjustments have helped.   White coat hypertension BP up here, but pt maintains that every bp at a drug store or other location shows normal reading. Continue monitoring outside of office and call/return if consistently >140/90.   An After Visit Summary was printed and given to the patient.  FOLLOW UP: 6 mo

## 2011-10-31 ENCOUNTER — Encounter: Payer: Self-pay | Admitting: Family Medicine

## 2011-10-31 NOTE — Assessment & Plan Note (Addendum)
He has once again postponed any consideration of medication for this, wanting to wait 4 more months now to get his exercise regimen going. Will recheck FLP today since he has fasted today--we'll see how his latest dietary adjustments have helped.

## 2011-10-31 NOTE — Assessment & Plan Note (Signed)
BP up here, but pt maintains that every bp at a drug store or other location shows normal reading. Continue monitoring outside of office and call/return if consistently >140/90.

## 2011-10-31 NOTE — Assessment & Plan Note (Signed)
Good control as per his report of fasting cbgs. Feet feeling back to normal! Diet is fine but he needs to work on exercise regimen. Check HbA1c today. Refer to optometry today for his initial diabetic retinopathy screening exam. Pneumovax and influenza given IM today.

## 2011-11-02 LAB — LDL CHOLESTEROL, DIRECT: Direct LDL: 137 mg/dL

## 2012-02-29 ENCOUNTER — Ambulatory Visit: Payer: BC Managed Care – PPO | Admitting: Family Medicine

## 2012-03-07 ENCOUNTER — Ambulatory Visit (INDEPENDENT_AMBULATORY_CARE_PROVIDER_SITE_OTHER): Payer: BC Managed Care – PPO | Admitting: Family Medicine

## 2012-03-07 ENCOUNTER — Encounter: Payer: Self-pay | Admitting: Family Medicine

## 2012-03-07 ENCOUNTER — Other Ambulatory Visit: Payer: Self-pay | Admitting: Family Medicine

## 2012-03-07 VITALS — BP 150/92 | HR 64 | Ht 72.0 in | Wt 227.0 lb

## 2012-03-07 DIAGNOSIS — Z1211 Encounter for screening for malignant neoplasm of colon: Secondary | ICD-10-CM | POA: Insufficient documentation

## 2012-03-07 LAB — COMPREHENSIVE METABOLIC PANEL
Albumin: 4.5 g/dL (ref 3.5–5.2)
Alkaline Phosphatase: 45 U/L (ref 39–117)
CO2: 28 mEq/L (ref 19–32)
Calcium: 9.5 mg/dL (ref 8.4–10.5)
Chloride: 100 mEq/L (ref 96–112)
Glucose, Bld: 113 mg/dL — ABNORMAL HIGH (ref 70–99)
Potassium: 4.3 mEq/L (ref 3.5–5.1)
Sodium: 137 mEq/L (ref 135–145)
Total Protein: 7.7 g/dL (ref 6.0–8.3)

## 2012-03-07 LAB — LIPID PANEL
Cholesterol: 206 mg/dL — ABNORMAL HIGH (ref 0–200)
Total CHOL/HDL Ratio: 5

## 2012-03-07 LAB — LDL CHOLESTEROL, DIRECT: Direct LDL: 139.7 mg/dL

## 2012-03-07 NOTE — Assessment & Plan Note (Signed)
He consistently has bp in normal range outside of our office per his report.

## 2012-03-07 NOTE — Assessment & Plan Note (Signed)
Check HbA1c, urine microalb/cr ratio, and encouraged pt to get diab retin screening exam. Continue glucose checks at home about 3-4 times per week, but if A1c up I'll have him check a bit more often to see if we can detect and obvious pattern of hyperglycemia. Foot exam completed today: mild sensory deficit diffusely with monofilament testing today.

## 2012-03-07 NOTE — Progress Notes (Signed)
OFFICE NOTE  03/07/2012  CC:  Chief Complaint  Patient presents with  . Follow-up    DM      HPI: Patient is a 57 y.o. Caucasian male who is here for 4 mo f/u DM 2 and hyperlipidemia. Has been trying to work on TLC for his lipids, has been hesitant to start meds.    Checking fasting gluc: avg 130.  Admits to not eating quite as well as the previous 64mo. Says he is enjoying food/life a bit better, trying to adjust carb intake/fiber intake.  Says feet feel completely back to normal, although he continues to have a callus on right great toe.  He says he files it down regularly.  No pain, no redness, no fluctuance.  I offered to cut down the callus today with a scalpel but he quickly declined. Still hasn't gotten eye exam. No colonoscopy yet, he keeps postponing this screening procedure.  Pertinent PMH:  Past Medical History  Diagnosis Date  . DM type 2 (diabetes mellitus, type 2) 09/12/10    HbA1c 12.6% at the time of dx.  . Elevated blood pressure reading without diagnosis of hypertension 12/26/2010    White coat HTN  . Toe ulcer due to DM fall 2012    Right great toe   Past Surgical History  Procedure Laterality Date  . Root canal    . Tonsillectomy      MEDS:  Outpatient Prescriptions Prior to Visit  Medication Sig Dispense Refill  . aspirin 81 MG tablet Take 81 mg by mouth daily.      . Cholecalciferol (VITAMIN D3) 1000 UNITS CAPS Take 1 capsule by mouth daily.      Marland Kitchen glucose blood (FREESTYLE LITE) test strip Use to check blood sugar twice daily as directed  100 each  5  . Lancet Devices (B-D LANCET DEVICE) MISC Use as directed to check blood sugars 2 times per day  60 each  5  . metFORMIN (GLUCOPHAGE) 500 MG tablet TAKE TWO TABLETS BY MOUTH TWO TIMES DAILY WITH A MEAL  120 tablet  5  . NON FORMULARY Diabetic MVit Pak      . vitamin C (ASCORBIC ACID) 500 MG tablet Take 500 mg by mouth as needed.        Marland Kitchen acetaminophen (TYLENOL) 500 MG tablet Take 500 mg by mouth every 6  (six) hours as needed.        . Multiple Vitamins-Minerals (CENTRUM PO) Take 1 tablet by mouth daily.        . Multiple Vitamins-Minerals (ZINC PO) Take 1 tablet by mouth daily.      . Omega-3 Fatty Acids (FISH OIL) 1000 MG CAPS Take 1 capsule by mouth daily.         No facility-administered medications prior to visit.    PE: Blood pressure 150/92, pulse 64, height 6' (1.829 m), weight 227 lb (102.967 kg). Gen: Alert, well appearing.  Patient is oriented to person, place, time, and situation. CV: RRR, no m/r/g.   LUNGS: CTA bilat, nonlabored resps, good aeration in all lung fields. EXT: no clubbing, cyanosis, or edema.  Feet: no erythema, no skin breakdown.  Nails normal.  Right great toe with nickel-sized callus that is firm, nontender, without warmth or erythema.  DP and PT pulses 1+ bilat.  Ankle, foot, and toes ROM all intact.   Monofilament testing revealed diminished sensation in all areas tested on plantar surfaces of both feet.  IMPRESSION AND PLAN:  Type II or  unspecified type diabetes mellitus with neurological manifestations, not stated as uncontrolled(250.60) Check HbA1c, urine microalb/cr ratio, and encouraged pt to get diab retin screening exam. Continue glucose checks at home about 3-4 times per week, but if A1c up I'll have him check a bit more often to see if we can detect and obvious pattern of hyperglycemia. Foot exam completed today: mild sensory deficit diffusely with monofilament testing today.  Hyperlipidemia TLC at this time. Check FLP today.  Colon cancer screening Encouraged pt to get colonoscopy, but he is still hesitating/thinking about the best timing b/c he and his wife want to time it so they get it done around the same time.  Discussed iFOB today and he agreed to checking this.  White coat hypertension He consistently has bp in normal range outside of our office per his report.   An After Visit Summary was printed and given to the patient.  FOLLOW  UP: 15mo

## 2012-03-07 NOTE — Assessment & Plan Note (Signed)
Encouraged pt to get colonoscopy, but he is still hesitating/thinking about the best timing b/c he and his wife want to time it so they get it done around the same time.  Discussed iFOB today and he agreed to checking this.

## 2012-03-07 NOTE — Assessment & Plan Note (Addendum)
TLC at this time. Check FLP today.

## 2012-03-20 ENCOUNTER — Other Ambulatory Visit: Payer: Self-pay | Admitting: Family Medicine

## 2012-03-21 NOTE — Telephone Encounter (Signed)
eScribe request for refill on METFORMIN Last filled - 09/2011, #120 X 5 Last seen on - 03/07/12 Follow up - 4 MONTHS RX sent

## 2012-04-21 ENCOUNTER — Encounter: Payer: Self-pay | Admitting: Family Medicine

## 2012-04-21 ENCOUNTER — Ambulatory Visit (INDEPENDENT_AMBULATORY_CARE_PROVIDER_SITE_OTHER): Payer: BC Managed Care – PPO | Admitting: Family Medicine

## 2012-04-21 VITALS — BP 130/82 | HR 72

## 2012-04-21 DIAGNOSIS — S61412A Laceration without foreign body of left hand, initial encounter: Secondary | ICD-10-CM

## 2012-04-21 DIAGNOSIS — S61209A Unspecified open wound of unspecified finger without damage to nail, initial encounter: Secondary | ICD-10-CM

## 2012-04-21 MED ORDER — OXYCODONE HCL 5 MG PO TABS: ORAL_TABLET | ORAL | Status: DC

## 2012-04-21 MED ORDER — CEPHALEXIN 500 MG PO CAPS: 500.0000 mg | ORAL_CAPSULE | Freq: Three times a day (TID) | ORAL | Status: DC

## 2012-04-23 ENCOUNTER — Encounter: Payer: Self-pay | Admitting: Family Medicine

## 2012-04-23 DIAGNOSIS — S61412A Laceration without foreign body of left hand, initial encounter: Secondary | ICD-10-CM | POA: Insufficient documentation

## 2012-04-23 NOTE — Progress Notes (Signed)
OFFICE NOTE  04/23/2012  CC:  Chief Complaint  Patient presents with  . Finger Injury    cut on cooler     HPI: Patient is a 57 y.o. Caucasian male who is here for walk-in visit for finger laceration. Was setting down a very heavy cooler next to another one and his left hand middle finger was briefly clipped/caught between the coolers. He noted it cut the tip of his finger and he presented here for repair.  Pertinent PMH:  Past Medical History  Diagnosis Date  . DM type 2 (diabetes mellitus, type 2) 09/12/10    HbA1c 12.6% at the time of dx.  . Elevated blood pressure reading without diagnosis of hypertension 12/26/2010    White coat HTN  . Toe ulcer due to DM fall 2012    Right great toe    MEDS:  Outpatient Prescriptions Prior to Visit  Medication Sig Dispense Refill  . acetaminophen (TYLENOL) 500 MG tablet Take 500 mg by mouth every 6 (six) hours as needed.        Marland Kitchen aspirin 81 MG tablet Take 81 mg by mouth daily.      . Cholecalciferol (VITAMIN D3) 1000 UNITS CAPS Take 1 capsule by mouth daily.      . Chromium 400 MCG TABS Take 1 tablet by mouth daily.      . Cyanocobalamin (B-12) 1000 MCG CAPS Take 1 capsule by mouth daily.      Marland Kitchen glucose blood (FREESTYLE LITE) test strip Use to check blood sugar twice daily as directed  100 each  5  . Krill Oil 500 MG CAPS Take 1 capsule by mouth daily.      Demetra Shiner Devices (B-D LANCET DEVICE) MISC Use as directed to check blood sugars 2 times per day  60 each  5  . metFORMIN (GLUCOPHAGE) 500 MG tablet TAKE TWO TABLETS BY MOUTH TWICE DAILY WITH  A  MEAL  120 tablet  5  . Multiple Vitamins-Minerals (CENTRUM PO) Take 1 tablet by mouth daily.        . Multiple Vitamins-Minerals (ZINC PO) Take 1 tablet by mouth daily.      . NON FORMULARY Diabetic MVit Pak      . NON FORMULARY CinSulin.  Cinnamon Bark Extract.  500 mg daily      . vitamin C (ASCORBIC ACID) 500 MG tablet Take 500 mg by mouth as needed.         No facility-administered  medications prior to visit.    PE: Blood pressure 130/82, pulse 72. Gen: alert, NAD.  Well appearing. Left hand middle finger with deep laceration that involves the distal nail bed and extends to the lateral finger edge.  Fingernail is avulsed at the distal 1/4 but otherwise intact.  Mild ongoing bleeding noted.  IMPRESSION AND PLAN:  Laceration of finger of left hand with complication Left hand index fingertip. Nail bed lacerated but nail intact: need to removed fingernail to be able to completely suture the fairly deep laceration.  PROCEDURE: fingernail removal with nail bed lac/fingertip lac suture repair. Irrigated middle finger of left hand, wound explored--depth approx 3/4 cm.  Local anesthesia with 4 cc of 2% lidocaine w/out epi (digital block).  Using sterile procedure, I used nail splitters to lift nail from matrix and split nail in half.  Each half was then pulled out completely with curved forceps.  I then used 4-0 vicryl absorbable suture to place 4 vertical mattress sutures to reapproximate wound edges.  Pt tolerated procedure well.  No immediate complications.  Wound wrapped, wound care at home was discussed. Keflex 500mg  tid x 7d rx'd. Oxycodone 5mg , 1-2 q6h prn rx'd, #40, no RF.  I recommended he take alleve or ibuprofen tid with food as well. An After Visit Summary was printed and given to the patient.   FOLLOW UP: 1 wk for wound check

## 2012-04-23 NOTE — Assessment & Plan Note (Addendum)
Left hand index fingertip. Nail bed lacerated but nail intact: need to removed fingernail to be able to completely suture the fairly deep laceration.  PROCEDURE: fingernail removal with nail bed lac/fingertip lac suture repair. Irrigated middle finger of left hand, wound explored--depth approx 3/4 cm.  Local anesthesia with 4 cc of 2% lidocaine w/out epi (digital block).  Using sterile procedure, I used nail splitters to lift nail from matrix and split nail in half.  Each half was then pulled out completely with curved forceps.  I then used 4-0 vicryl absorbable suture to place 4 vertical mattress sutures to reapproximate wound edges.  Pt tolerated procedure well.  No immediate complications.

## 2012-05-13 ENCOUNTER — Ambulatory Visit (INDEPENDENT_AMBULATORY_CARE_PROVIDER_SITE_OTHER): Payer: BC Managed Care – PPO | Admitting: Family Medicine

## 2012-05-13 ENCOUNTER — Encounter: Payer: Self-pay | Admitting: Family Medicine

## 2012-05-13 VITALS — BP 130/80 | HR 86 | Temp 97.6°F | Ht 72.0 in | Wt 228.8 lb

## 2012-05-13 DIAGNOSIS — S61412D Laceration without foreign body of left hand, subsequent encounter: Secondary | ICD-10-CM

## 2012-05-13 DIAGNOSIS — Z5189 Encounter for other specified aftercare: Secondary | ICD-10-CM

## 2012-05-14 ENCOUNTER — Encounter: Payer: Self-pay | Admitting: Family Medicine

## 2012-05-14 NOTE — Progress Notes (Signed)
Pt here for 3 wk f/u --his first f/u--since I sutured an injury on left hand middle fingertip, removed nail, etc. He took abx as rx'd, says it has felt great.  No fevers, no swelling, no exudate.   He has a bit of numbness on tip of finger. EXAM: VS normal Gen: alert, well appearing. Left hand, middle finger:  Distal nail bed wound with edges well-approximated, scab present in nail bed, absorbable suture visible.  No erythema, no tenderness.  ROM of finger fully intact.  Finger with brisk cap refill.  No swelling. Laceration of finger of left hand with complication Looks excellent. Expect suture to gradually dissolve, expect nail to regrow.  Hopefully sensation to fingertip will also return over the long term.   Prn--keep f/u for routine chronic illness management.

## 2012-05-14 NOTE — Assessment & Plan Note (Signed)
Looks excellent. Expect suture to gradually dissolve, expect nail to regrow.  Hopefully sensation to fingertip will also return over the long term.

## 2012-07-14 ENCOUNTER — Other Ambulatory Visit: Payer: Self-pay

## 2012-09-19 ENCOUNTER — Telehealth: Payer: Self-pay | Admitting: Family Medicine

## 2012-09-19 DIAGNOSIS — E119 Type 2 diabetes mellitus without complications: Secondary | ICD-10-CM

## 2012-09-19 MED ORDER — METFORMIN HCL 500 MG PO TABS: ORAL_TABLET | ORAL | Status: DC

## 2012-09-19 NOTE — Telephone Encounter (Signed)
Patient schedule OV 09/26/12.

## 2012-09-19 NOTE — Telephone Encounter (Signed)
Sent in 30 day supply of metformin with no refills.  Patient needs follow up in order to get more refills.

## 2012-09-21 ENCOUNTER — Other Ambulatory Visit: Payer: Self-pay | Admitting: Family Medicine

## 2012-09-21 NOTE — Telephone Encounter (Signed)
Patient is already on the schedule for Monday 09/26/12 for a follow up appt. Should I still call him?

## 2012-09-21 NOTE — Telephone Encounter (Signed)
You can call him and make sure he has enough medication until that day.

## 2012-09-21 NOTE — Telephone Encounter (Signed)
Patient needs an appointment.  Due for lab work on 7/214.  Declined patient medication until he is seen in office.  Please let patient know.

## 2012-09-26 ENCOUNTER — Ambulatory Visit (INDEPENDENT_AMBULATORY_CARE_PROVIDER_SITE_OTHER): Payer: BC Managed Care – PPO | Admitting: Family Medicine

## 2012-09-26 ENCOUNTER — Other Ambulatory Visit: Payer: Self-pay | Admitting: Family Medicine

## 2012-09-26 ENCOUNTER — Encounter: Payer: Self-pay | Admitting: Family Medicine

## 2012-09-26 VITALS — BP 132/66 | HR 81 | Temp 97.8°F | Resp 18 | Ht 72.0 in | Wt 225.0 lb

## 2012-09-26 DIAGNOSIS — E785 Hyperlipidemia, unspecified: Secondary | ICD-10-CM

## 2012-09-26 DIAGNOSIS — E1149 Type 2 diabetes mellitus with other diabetic neurological complication: Secondary | ICD-10-CM

## 2012-09-26 DIAGNOSIS — Z23 Encounter for immunization: Secondary | ICD-10-CM

## 2012-09-26 DIAGNOSIS — E119 Type 2 diabetes mellitus without complications: Secondary | ICD-10-CM

## 2012-09-26 DIAGNOSIS — Z1211 Encounter for screening for malignant neoplasm of colon: Secondary | ICD-10-CM

## 2012-09-26 LAB — MICROALBUMIN / CREATININE URINE RATIO
Creatinine,U: 169.1 mg/dL
Microalb Creat Ratio: 2.4 mg/g (ref 0.0–30.0)
Microalb, Ur: 4.1 mg/dL — ABNORMAL HIGH (ref 0.0–1.9)

## 2012-09-26 LAB — HEMOGLOBIN A1C: Hgb A1c MFr Bld: 7.1 % — ABNORMAL HIGH (ref 4.6–6.5)

## 2012-09-26 LAB — LIPID PANEL: HDL: 50.9 mg/dL (ref >39.00)

## 2012-09-26 MED ORDER — METFORMIN HCL 500 MG PO TABS: ORAL_TABLET | ORAL | Status: DC

## 2012-09-26 NOTE — Assessment & Plan Note (Signed)
Stable. Check HbA1c today and urine microalb/cr. Continue diet/med.

## 2012-09-26 NOTE — Assessment & Plan Note (Signed)
Resolved

## 2012-09-26 NOTE — Assessment & Plan Note (Addendum)
Pt has deferred multiple times b/c he wants to time it where he and his wife coordinate things together. I ordered iFob for him last visit but this was never returned. Will readdress at CPE in 6 mo.

## 2012-09-26 NOTE — Progress Notes (Signed)
OFFICE NOTE  09/26/2012  CC:  Chief Complaint  Patient presents with  . Diabetes     HPI: Patient is a 57 y.o. Caucasian male who is here for f/u DM. His left hand 3rd digit complicated lac has healed well. Fasting gluc about 115 avg.  No bp's to report. He is compliant with meds and diet.  Busy with work as usual.  Pertinent PMH:  Past Medical History  Diagnosis Date  . DM type 2 (diabetes mellitus, type 2) 09/12/10    HbA1c 12.6% at the time of dx.  . Elevated blood pressure reading without diagnosis of hypertension 12/26/2010    White coat HTN  . Toe ulcer due to DM fall 2012    Right great toe   Past Surgical History  Procedure Laterality Date  . Root canal    . Tonsillectomy     History   Social History Narrative   Married, has 2 grown sons.     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  Also works in his [employer].   No formal exercise regimen but very active--NOT SEDENTARY.   No T/A/Ds.    MEDS:   **No keflex or oxycodone today. Outpatient Prescriptions Prior to Visit  Medication Sig Dispense Refill  . aspirin 81 MG tablet Take 81 mg by mouth daily.      . Cholecalciferol (VITAMIN D3) 1000 UNITS CAPS Take 1 capsule by mouth daily.      . Chromium 400 MCG TABS Take 1 tablet by mouth daily.      Marland Kitchen CINNAMON PO Take by mouth.      . Cyanocobalamin (B-12) 1000 MCG CAPS Take 1 capsule by mouth daily.      Boris Lown Oil 500 MG CAPS Take 1 capsule by mouth daily.      . metFORMIN (GLUCOPHAGE) 500 MG tablet TAKE TWO TABLETS BY MOUTH TWICE DAILY WITH  A  MEAL  120 tablet  0  . Multiple Vitamins-Minerals (CENTRUM PO) Take 1 tablet by mouth daily.        . Multiple Vitamins-Minerals (ZINC PO) Take 1 tablet by mouth daily.      . NON FORMULARY Diabetic MVit Pak      . NON FORMULARY CinSulin.  Cinnamon Bark Extract.  500 mg daily      . vitamin C (ASCORBIC ACID) 500 MG tablet Take 500 mg by mouth as needed.        Marland Kitchen acetaminophen (TYLENOL) 500 MG  tablet Take 500 mg by mouth every 6 (six) hours as needed.        . cephALEXin (KEFLEX) 500 MG capsule Take 1 capsule (500 mg total) by mouth 3 (three) times daily.  21 capsule  0  . glucose blood (FREESTYLE LITE) test strip Use to check blood sugar twice daily as directed  100 each  5  . Lancet Devices (B-D LANCET DEVICE) MISC Use as directed to check blood sugars 2 times per day  60 each  5  . oxyCODONE (OXY IR/ROXICODONE) 5 MG immediate release tablet 1-2 tabs po q6h prn pain  40 tablet  0   No facility-administered medications prior to visit.    PE: Blood pressure 132/66, pulse 81, temperature 97.8 F (36.6 C), temperature source Temporal, resp. rate 18, height 6' (1.829 m), weight 225 lb (102.059 kg), SpO2 98.00%. Gen: Alert, well appearing.  Patient is oriented to person, place, time, and situation. AFFECT: pleasant, lucid thought and speech. No further exam  today.  IMPRESSION AND PLAN:  Type II or unspecified type diabetes mellitus with neurological manifestations, not stated as uncontrolled(250.60) Stable. Check HbA1c today and urine microalb/cr. Continue diet/med.   Hyperlipidemia TLC last few months instead of starting med--per pt preference. Recheck FLP today.  Laceration of finger of left hand with complication Resolved.  Colon cancer screening Pt has deferred multiple times b/c he wants to time it where he and his wife coordinate things together. I ordered iFob for him last visit but this was never returned. Will readdress at CPE in 6 mo.   Flu vaccine IM today.  An After Visit Summary was printed and given to the patient.  FOLLOW UP: 6 mo for CPE

## 2012-09-26 NOTE — Assessment & Plan Note (Signed)
TLC last few months instead of starting med--per pt preference. Recheck FLP today.

## 2012-12-26 ENCOUNTER — Encounter: Payer: Self-pay | Admitting: Family Medicine

## 2012-12-26 ENCOUNTER — Ambulatory Visit (INDEPENDENT_AMBULATORY_CARE_PROVIDER_SITE_OTHER): Payer: BC Managed Care – PPO | Admitting: Family Medicine

## 2012-12-26 VITALS — BP 151/95 | HR 87 | Temp 98.4°F | Ht 72.0 in | Wt 230.5 lb

## 2012-12-26 DIAGNOSIS — J111 Influenza due to unidentified influenza virus with other respiratory manifestations: Secondary | ICD-10-CM

## 2012-12-26 DIAGNOSIS — IMO0001 Reserved for inherently not codable concepts without codable children: Secondary | ICD-10-CM

## 2012-12-26 DIAGNOSIS — I10 Essential (primary) hypertension: Secondary | ICD-10-CM

## 2012-12-26 NOTE — Progress Notes (Signed)
Pre-visit discussion using our clinic review tool. No additional management support is needed unless otherwise documented below in the visit note.  

## 2012-12-26 NOTE — Assessment & Plan Note (Signed)
Also took decongestants today. Recommended he d/c all decongestant meds and avoid them in the future.

## 2012-12-26 NOTE — Progress Notes (Signed)
OFFICE NOTE  12/26/2012  CC:  Chief Complaint  Patient presents with  . Cough     HPI: Patient is a 57 y.o. Caucasian male who is here for "flu-like symptoms". Onset 5d/a, chills and diarrhea--took imodium and this helped.  Also with nasal congestion/runny nose, some gravely voice.  Coughing+.  No known fever.  Feeling a bit better today. Feels a little sore in body diffusely.     Pertinent PMH:  Past Medical History  Diagnosis Date  . DM type 2 (diabetes mellitus, type 2) 09/12/10    HbA1c 12.6% at the time of dx.  . Elevated blood pressure reading without diagnosis of hypertension 12/26/2010    White coat HTN  . Toe ulcer due to DM fall 2012    Right great toe    MEDS:  Outpatient Prescriptions Prior to Visit  Medication Sig Dispense Refill  . acetaminophen (TYLENOL) 500 MG tablet Take 500 mg by mouth every 6 (six) hours as needed.        Marland Kitchen aspirin 81 MG tablet Take 81 mg by mouth daily.      . Cholecalciferol (VITAMIN D3) 1000 UNITS CAPS Take 1 capsule by mouth daily.      . Chromium 400 MCG TABS Take 1 tablet by mouth daily.      Marland Kitchen CINNAMON PO Take by mouth.      . Cyanocobalamin (B-12) 1000 MCG CAPS Take 1 capsule by mouth daily.      Marland Kitchen glucose blood (FREESTYLE LITE) test strip Use to check blood sugar twice daily as directed  100 each  5  . Krill Oil 500 MG CAPS Take 1 capsule by mouth daily.      Demetra Shiner Devices (B-D LANCET DEVICE) MISC Use as directed to check blood sugars 2 times per day  60 each  5  . metFORMIN (GLUCOPHAGE) 500 MG tablet TAKE TWO TABLETS BY MOUTH TWICE DAILY WITH  A  MEAL  120 tablet  3  . Multiple Vitamins-Minerals (CENTRUM PO) Take 1 tablet by mouth daily.        . Multiple Vitamins-Minerals (ZINC PO) Take 1 tablet by mouth daily.      . NON FORMULARY Diabetic MVit Pak      . NON FORMULARY CinSulin.  Cinnamon Bark Extract.  500 mg daily      . oxyCODONE (OXY IR/ROXICODONE) 5 MG immediate release tablet 1-2 tabs po q6h prn pain  40 tablet  0   . vitamin C (ASCORBIC ACID) 500 MG tablet Take 500 mg by mouth as needed.        . cephALEXin (KEFLEX) 500 MG capsule Take 1 capsule (500 mg total) by mouth 3 (three) times daily.  21 capsule  0   No facility-administered medications prior to visit.  **Not taking keflex as listed above.  PE: Blood pressure 151/95, pulse 87, temperature 98.4 F (36.9 C), temperature source Temporal, height 6' (1.829 m), weight 230 lb 8 oz (104.554 kg), SpO2 95.00%. VS: noted--normal. Gen: alert, NAD, NONTOXIC APPEARING. HEENT: eyes without injection, drainage, or swelling.  Ears: EACs clear, TMs with normal light reflex and landmarks.  Nose: Clear rhinorrhea, with some dried, crusty exudate adherent to mildly injected mucosa.  No purulent d/c.  No paranasal sinus TTP.  No facial swelling.  Throat and mouth without focal lesion.  No pharyngial swelling, erythema, or exudate.   Neck: supple, no LAD.   LUNGS: CTA bilat, nonlabored resps.   CV: RRR, no m/r/g. EXT: no  c/c/e SKIN: no rash  LABS: none  IMPRESSION AND PLAN:  Resolving Flu-like illness.   Symptomatic care discussed. No rx's today.  White coat hypertension Also took decongestants today. Recommended he d/c all decongestant meds and avoid them in the future.   An After Visit Summary was printed and given to the patient.  FOLLOW UP: prn

## 2013-02-20 ENCOUNTER — Other Ambulatory Visit: Payer: Self-pay | Admitting: Family Medicine

## 2013-02-20 ENCOUNTER — Telehealth: Payer: Self-pay | Admitting: Family Medicine

## 2013-02-20 MED ORDER — METFORMIN HCL 500 MG PO TABS: ORAL_TABLET | ORAL | Status: DC

## 2013-02-20 NOTE — Telephone Encounter (Signed)
Medication was sent this am.

## 2013-02-20 NOTE — Telephone Encounter (Signed)
Patient is out of med 

## 2013-03-20 ENCOUNTER — Other Ambulatory Visit (INDEPENDENT_AMBULATORY_CARE_PROVIDER_SITE_OTHER): Payer: BC Managed Care – PPO

## 2013-03-20 DIAGNOSIS — Z0389 Encounter for observation for other suspected diseases and conditions ruled out: Secondary | ICD-10-CM

## 2013-03-20 DIAGNOSIS — Z0000 Encounter for general adult medical examination without abnormal findings: Secondary | ICD-10-CM

## 2013-03-20 LAB — CBC WITH DIFFERENTIAL/PLATELET
BASOS ABS: 0 10*3/uL (ref 0.0–0.1)
Basophils Relative: 0.5 % (ref 0.0–3.0)
EOS ABS: 0.2 10*3/uL (ref 0.0–0.7)
Eosinophils Relative: 2.2 % (ref 0.0–5.0)
HCT: 42.4 % (ref 39.0–52.0)
Hemoglobin: 13.9 g/dL (ref 13.0–17.0)
LYMPHS PCT: 28 % (ref 12.0–46.0)
Lymphs Abs: 2.3 10*3/uL (ref 0.7–4.0)
MCHC: 32.7 g/dL (ref 30.0–36.0)
MCV: 86 fl (ref 78.0–100.0)
Monocytes Absolute: 0.9 10*3/uL (ref 0.1–1.0)
Monocytes Relative: 11 % (ref 3.0–12.0)
NEUTROS ABS: 4.7 10*3/uL (ref 1.4–7.7)
NEUTROS PCT: 58.3 % (ref 43.0–77.0)
PLATELETS: 220 10*3/uL (ref 150.0–400.0)
RBC: 4.93 Mil/uL (ref 4.22–5.81)
RDW: 13.7 % (ref 11.5–14.6)
WBC: 8.1 10*3/uL (ref 4.5–10.5)

## 2013-03-20 LAB — COMPREHENSIVE METABOLIC PANEL
ALK PHOS: 43 U/L (ref 39–117)
ALT: 21 U/L (ref 0–53)
AST: 20 U/L (ref 0–37)
Albumin: 4.4 g/dL (ref 3.5–5.2)
BILIRUBIN TOTAL: 0.8 mg/dL (ref 0.3–1.2)
BUN: 17 mg/dL (ref 6–23)
CALCIUM: 9.2 mg/dL (ref 8.4–10.5)
CO2: 30 mEq/L (ref 19–32)
CREATININE: 0.7 mg/dL (ref 0.4–1.5)
Chloride: 102 mEq/L (ref 96–112)
GFR: 117.5 mL/min (ref >60.00)
Glucose, Bld: 146 mg/dL — ABNORMAL HIGH (ref 70–99)
Potassium: 4.3 mEq/L (ref 3.5–5.1)
Sodium: 138 mEq/L (ref 135–145)
Total Protein: 7.3 g/dL (ref 6.0–8.3)

## 2013-03-20 LAB — PSA: PSA: 2.37 ng/mL (ref 0.10–4.00)

## 2013-03-20 LAB — LIPID PANEL
Cholesterol: 212 mg/dL — ABNORMAL HIGH (ref 0–200)
HDL: 46.6 mg/dL (ref >39.00)
LDL CALC: 141 mg/dL — AB (ref 0–99)
TRIGLYCERIDES: 120 mg/dL (ref 0.0–149.0)
Total CHOL/HDL Ratio: 5
VLDL: 24 mg/dL (ref 0.0–40.0)

## 2013-03-20 LAB — TSH: TSH: 0.69 u[IU]/mL (ref 0.35–5.50)

## 2013-03-21 ENCOUNTER — Ambulatory Visit: Payer: BC Managed Care – PPO

## 2013-03-21 DIAGNOSIS — IMO0001 Reserved for inherently not codable concepts without codable children: Secondary | ICD-10-CM

## 2013-03-21 DIAGNOSIS — E1165 Type 2 diabetes mellitus with hyperglycemia: Principal | ICD-10-CM

## 2013-03-21 LAB — HEMOGLOBIN A1C: Hgb A1c MFr Bld: 7.9 % — ABNORMAL HIGH (ref 4.6–6.5)

## 2013-03-27 ENCOUNTER — Ambulatory Visit (INDEPENDENT_AMBULATORY_CARE_PROVIDER_SITE_OTHER): Payer: BC Managed Care – PPO | Admitting: Family Medicine

## 2013-03-27 ENCOUNTER — Encounter: Payer: Self-pay | Admitting: Family Medicine

## 2013-03-27 ENCOUNTER — Encounter: Payer: BC Managed Care – PPO | Admitting: Family Medicine

## 2013-03-27 VITALS — BP 158/96 | HR 80 | Temp 98.3°F | Resp 18 | Ht 72.0 in | Wt 230.0 lb

## 2013-03-27 DIAGNOSIS — Z0000 Encounter for general adult medical examination without abnormal findings: Secondary | ICD-10-CM

## 2013-03-27 DIAGNOSIS — E1149 Type 2 diabetes mellitus with other diabetic neurological complication: Secondary | ICD-10-CM

## 2013-03-27 DIAGNOSIS — E785 Hyperlipidemia, unspecified: Secondary | ICD-10-CM

## 2013-03-27 DIAGNOSIS — Z1211 Encounter for screening for malignant neoplasm of colon: Secondary | ICD-10-CM

## 2013-03-27 NOTE — Assessment & Plan Note (Signed)
Needs statin per guidelines. However, pt resistant to this idea, wants to try TLC. I'm ok with this choice b/c I have informed him of the risks/benefits. Recheck FLP in 4 mo.  Push for statin harder if numbers not improved.

## 2013-03-27 NOTE — Assessment & Plan Note (Addendum)
Control not ideal. Pt insists on further TLC trials. If A1c worse at next f/u in 36mo then we'll have to adjust med treatment. Reminded pt of need for his annual eye exam.

## 2013-03-27 NOTE — Progress Notes (Signed)
Office Note 03/27/2013  CC:  Chief Complaint  Patient presents with  . Annual Exam    HPI:  [name]. is a 58 y.o. White male who is here for CPE, also discuss DM 2, elevated cholesterol. Reviewed recent fasting labs in detail today.   Has not been eating good diabetic diet or exercising lately like he had planned.  He is compliant with his med. He is very adamant about avoiding additional meds right now and wants to pursue TLC at this time.  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    Past Medical History  Diagnosis Date  . DM type 2 (diabetes mellitus, type 2) 09/12/10    HbA1c 12.6% at the time of dx.  . Elevated blood pressure reading without diagnosis of hypertension 12/26/2010    White coat HTN  . Toe ulcer due to DM fall 2012    Right great toe    Past Surgical History  Procedure Laterality Date  . Root canal    . Tonsillectomy      Family History  Problem Relation Age of Onset  . Diabetes Maternal Grandmother     type 2  . Cancer Maternal Grandfather     all over  . Arthritis Paternal Grandmother   . Heart attack Paternal Grandfather     smoke  . Alcohol abuse Paternal Grandfather     smoker and drinker  . Hodgkin's lymphoma Cousin   . Diabetes Father     History   Social History  . Marital Status: Married    Spouse Name: N/A    Number of Children: N/A  . Years of Education: N/A   Occupational History  . Not on file.   Social History Main Topics  . Smoking status: Never Smoker   . Smokeless tobacco: Former Neurosurgeon    Types: Snuff    Quit date: 01/06/2000     Comment: [place]  . Alcohol Use: No  . Drug Use: No  . Sexual Activity: Yes    Partners: Female   Other Topics Concern  . Not on file   Social History Narrative   Married, has 2 grown sons.     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  Also works in his [employer].   No formal exercise regimen but very active--NOT SEDENTARY.   No  T/A/Ds.    Outpatient Prescriptions Prior to Visit  Medication Sig Dispense Refill  . acetaminophen (TYLENOL) 500 MG tablet Take 500 mg by mouth every 6 (six) hours as needed.        Marland Kitchen aspirin 81 MG tablet Take 81 mg by mouth daily.      . Cholecalciferol (VITAMIN D3) 1000 UNITS CAPS Take 1 capsule by mouth daily.      . Chromium 400 MCG TABS Take 1 tablet by mouth daily.      Marland Kitchen CINNAMON PO Take by mouth.      . Cyanocobalamin (B-12) 1000 MCG CAPS Take 1 capsule by mouth daily.      Marland Kitchen glucose blood (FREESTYLE LITE) test strip Use to check blood sugar twice daily as directed  100 each  5  . Krill Oil 500 MG CAPS Take 1 capsule by mouth daily.      Demetra Shiner Devices (B-D LANCET DEVICE) MISC Use as directed to check blood sugars 2 times per day  60 each  5  . metFORMIN (GLUCOPHAGE) 500 MG tablet TAKE TWO TABLETS BY MOUTH TWICE  DAILY WITH  A  MEAL  120 tablet  3  . Multiple Vitamins-Minerals (CENTRUM PO) Take 1 tablet by mouth daily.        . Multiple Vitamins-Minerals (ZINC PO) Take 1 tablet by mouth daily.      . NON FORMULARY Diabetic MVit Pak      . NON FORMULARY CinSulin.  Cinnamon Bark Extract.  500 mg daily      . oxyCODONE (OXY IR/ROXICODONE) 5 MG immediate release tablet 1-2 tabs po q6h prn pain  40 tablet  0  . vitamin C (ASCORBIC ACID) 500 MG tablet Take 500 mg by mouth as needed.         No facility-administered medications prior to visit.    No Known Allergies  ROS Review of Systems  Constitutional: Negative for fever, chills, appetite change and fatigue.  HENT: Negative for congestion, dental problem, ear pain and sore throat.   Eyes: Negative for discharge, redness and visual disturbance.  Respiratory: Negative for cough, chest tightness, shortness of breath and wheezing.   Cardiovascular: Negative for chest pain, palpitations and leg swelling.  Gastrointestinal: Negative for nausea, vomiting, abdominal pain, diarrhea and blood in stool.  Genitourinary: Negative for  dysuria, urgency, frequency, hematuria, flank pain and difficulty urinating.  Musculoskeletal: Negative for arthralgias, back pain, joint swelling, myalgias and neck stiffness.  Skin: Negative for pallor and rash.  Neurological: Negative for dizziness, speech difficulty, weakness and headaches.  Hematological: Negative for adenopathy. Does not bruise/bleed easily.  Psychiatric/Behavioral: Negative for confusion and sleep disturbance. The patient is not nervous/anxious.     PE; Blood pressure 158/96, pulse 80, temperature 98.3 F (36.8 C), temperature source Oral, resp. rate 18, height 6' (1.829 m), weight 230 lb (104.327 kg), SpO2 97.00%. Gen: Alert, well appearing.  Patient is oriented to person, place, time, and situation. AFFECT: pleasant, lucid thought and speech. ENT: Ears: EACs clear, normal epithelium.  TMs with good light reflex and landmarks bilaterally.  Eyes: no injection, icteris, swelling, or exudate.  EOMI, PERRLA. Nose: no drainage or turbinate edema/swelling.  No injection or focal lesion.  Mouth: lips without lesion/swelling.  Oral mucosa pink and moist.  Dentition intact and without obvious caries or gingival swelling.  Oropharynx without erythema, exudate, or swelling.  Neck: supple/nontender.  No LAD, mass, or TM.  Carotid pulses 2+ bilaterally, without bruits. CV: RRR, no m/r/g.   LUNGS: CTA bilat, nonlabored resps, good aeration in all lung fields. ABD: soft, NT, ND, BS normal.  No hepatospenomegaly or mass.  No bruits. EXT: no clubbing, cyanosis, or edema.  Musculoskeletal: no joint swelling, erythema, warmth, or tenderness.  ROM of all joints intact. Skin - no sores or suspicious lesions or rashes or color changes Rectal exam: negative without mass, lesions or tenderness, PROSTATE EXAM: smooth and symmetric without nodules or tenderness (small prostate).  Pertinent labs:  Lab Results  Component Value Date   TSH 0.69 03/20/2013   Lab Results  Component Value Date    WBC 8.1 03/20/2013   HGB 13.9 03/20/2013   HCT 42.4 03/20/2013   MCV 86.0 03/20/2013   PLT 220.0 03/20/2013   Lab Results  Component Value Date   CREATININE 0.7 03/20/2013   BUN 17 03/20/2013   NA 138 03/20/2013   K 4.3 03/20/2013   CL 102 03/20/2013   CO2 30 03/20/2013   Lab Results  Component Value Date   ALT 21 03/20/2013   AST 20 03/20/2013   ALKPHOS 43 03/20/2013   BILITOT 0.8  03/20/2013   Lab Results  Component Value Date   CHOL 212* 03/20/2013   Lab Results  Component Value Date   HDL 46.60 03/20/2013   Lab Results  Component Value Date   LDLCALC 141* 03/20/2013   Lab Results  Component Value Date   TRIG 120.0 03/20/2013   Lab Results  Component Value Date   CHOLHDL 5 03/20/2013   Lab Results  Component Value Date   PSA 2.37 03/20/2013   Lab Results  Component Value Date   HGBA1C 7.9* 03/21/2013       ASSESSMENT AND PLAN:   No problem-specific assessment & plan notes found for this encounter.   FOLLOW UP:  No Follow-up on file.

## 2013-03-27 NOTE — Assessment & Plan Note (Signed)
Reviewed age and gender appropriate health maintenance issues (prudent diet, regular exercise, health risks of tobacco and excessive alcohol, use of seatbelts, fire alarms in home, use of sunscreen).  Also reviewed age and gender appropriate health screening as well as vaccine recommendations. Needs colonoscopy for colon cancer screening but he keeps deferring this despite being aware of risks. DRE normal today, PSA recently normal.

## 2013-06-20 ENCOUNTER — Other Ambulatory Visit: Payer: Self-pay | Admitting: Family Medicine

## 2013-06-20 MED ORDER — METFORMIN HCL 500 MG PO TABS: ORAL_TABLET | ORAL | Status: DC

## 2013-07-14 ENCOUNTER — Encounter: Payer: Self-pay | Admitting: Nurse Practitioner

## 2013-07-14 ENCOUNTER — Ambulatory Visit (INDEPENDENT_AMBULATORY_CARE_PROVIDER_SITE_OTHER): Payer: BC Managed Care – PPO | Admitting: Nurse Practitioner

## 2013-07-14 VITALS — BP 152/83 | HR 85 | Temp 98.3°F | Ht 72.0 in | Wt 231.0 lb

## 2013-07-14 DIAGNOSIS — Z91018 Allergy to other foods: Secondary | ICD-10-CM

## 2013-07-14 MED ORDER — EPINEPHRINE 0.3 MG/0.3ML IJ SOAJ: 0.3000 mg | Freq: Once | INTRAMUSCULAR | Status: DC

## 2013-07-14 NOTE — Progress Notes (Signed)
   Subjective:    Patient ID: Joel J Claudean KindsWilliams Jr., male    DOB: [birth year], 58 y.o.   MRN: [id number]  HPI Comments: Pt experienced N&V, facial erythema, scratchy throat about 1 hour after eating corn chowder.  Allergic Reaction This is a new problem. The current episode started 3 to 5 days ago (3 da. within 1 hour of eating corn chowder). The problem has been gradually improving since onset. The problem is moderate (N&V, rash on face, lips tingling, scratchy throat.). The patient was exposed to food. The time of exposure was just prior to onset. Incident location: restaurant. Associated symptoms include abdominal pain, eye redness, globus sensation, itching, a rash and vomiting. Pertinent negatives include no chest pain, chest pressure, coughing, difficulty breathing, drooling, eye itching, trouble swallowing or wheezing. (Lip tingling, scratchy throat.) There is no swelling present. Past treatments include diphenhydramine. The treatment provided mild relief.      Review of Systems  HENT: Negative for drooling and trouble swallowing.   Eyes: Positive for redness. Negative for itching.  Respiratory: Negative for cough and wheezing.   Cardiovascular: Negative for chest pain.  Gastrointestinal: Positive for vomiting and abdominal pain.  Skin: Positive for itching and rash.       Objective:   Physical Exam  Vitals reviewed. Constitutional: He is oriented to person, place, and time. He appears well-developed and well-nourished. No distress.  HENT:  Head: Normocephalic and atraumatic.  Right Ear: External ear normal.  Left Ear: External ear normal.  Mouth/Throat: Oropharynx is clear and moist. No oropharyngeal exudate.  Eyes: Right eye exhibits no discharge. Left eye exhibits no discharge.  Conjunctiva slightly injected.  Neck: Normal range of motion. Neck supple. No thyromegaly present.  Cardiovascular: Normal rate, regular rhythm and normal heart sounds.   No murmur  heard. Pulmonary/Chest: Effort normal and breath sounds normal. No respiratory distress. He has no wheezes. He has no rales.  Lymphadenopathy:    He has no cervical adenopathy.  Neurological: He is alert and oriented to person, place, and time.  Skin: Skin is warm and dry. There is erythema.  Sides of face erythematous, macular, splotchy.  Psychiatric: He has a normal mood and affect. His behavior is normal. Thought content normal.          Assessment & Plan:  1. Food allergy Corn chowder. New reaction. - Ambulatory referral to Allergy for food sensitivity testing. - EPINEPHrine 0.3 mg/0.3 mL IJ SOAJ injection; Inject 0.3 mLs (0.3 mg total) into the muscle once.  Dispense: 2 Device; Refill: 1 Start pepcid & claritin. See pt instructions. F/u PRN.

## 2013-07-14 NOTE — Progress Notes (Signed)
Pre visit review using our clinic review tool, if applicable. No additional management support is needed unless otherwise documented below in the visit note. 

## 2013-07-14 NOTE — Patient Instructions (Signed)
Start claritin 10 mg daily and pepcid 20 mg daily to block histamine.  See allergist for food sensitivity testing. Get epi-pen prescription filled. You should keep 2 pens within easy access at all times. If you feel like your throat is swelling, having trouble breathing, or wheezing over weekend, please go to ER.  Food Allergy A food allergy occurs from eating something you are sensitive to. Food allergies occur in all age groups. It may be passed to you from your parents (heredity).  CAUSES  Some common causes are cow's milk, seafood, eggs, nuts (including peanut butter), wheat, and soybeans. SYMPTOMS  Common problems are:   Swelling around the mouth.  An itchy, red rash.  Hives.  Vomiting.  Diarrhea. Severe allergic reactions are life-threatening. This reaction is called anaphylaxis. It can cause the mouth and throat to swell. This makes it hard to breathe and swallow. In severe reactions, only a small amount of food may be fatal within seconds. HOME CARE INSTRUCTIONS   If you are unsure what caused the reaction, keep a diary of foods eaten and symptoms that followed. Avoid foods that cause reactions.  If hives or rash are present:  Take medicines as directed.  Use an over-the-counter antihistamine (diphenhydramine) to treat hives and itching as needed.  Apply cold compresses to the skin or take baths in cool water. Avoid hot baths or showers. These will increase the redness and itching.  If you are severely allergic:  Hospitalization is often required following a severe reaction.  Wear a medical alert bracelet or necklace that describes the allergy.  Carry your anaphylaxis kit or epinephrine injection with you at all times. Both you and your family members should know how to use this. This can be lifesaving if you have a severe reaction. If epinephrine is used, it is important for you to seek immediate medical care or call your local emergency services (911 in [place] When  the epinephrine wears off, it can be followed by a delayed reaction, which can be fatal.  Replace your epinephrine immediately after use in case of another reaction.  Ask your caregiver for instructions if you have not been taught how to use an epinephrine injection.  Do not drive until medicines used to treat the reaction have worn off, unless approved by your caregiver. SEEK MEDICAL CARE IF:   You suspect a food allergy. Symptoms generally happen within 30 minutes of eating a food.  Your symptoms have not gone away within 2 days. See your caregiver sooner if symptoms are getting worse.  You develop new symptoms.  You want to retest yourself with a food or drink you think causes an allergic reaction. Never do this if an anaphylactic reaction to that food or drink has happened before.  There is a return of the symptoms which brought you to your caregiver. SEEK IMMEDIATE MEDICAL CARE IF:   You have trouble breathing, are wheezing, or you have a tight feeling in your chest or throat.  You have a swollen mouth, or you have hives, swelling, or itching all over your body. Use your epinephrine injection immediately. This is given into the outside of your thigh, deep into the muscle. Following use of the epinephrine injection, seek help right away. Seek immediate medical care or call your local emergency services (911 in [place] MAKE SURE YOU:   Understand these instructions.  Will watch your condition.  Will get help right away if you are not doing well or get worse. Document Released: 12/20/1999  Document Revised: 03/16/2011 Document Reviewed: 08/11/2007 [facility] Patient Information 2015 [place], [place]. This information is not intended to replace advice given to you by your health care provider. Make sure you discuss any questions you have with your health care provider.

## 2013-07-17 ENCOUNTER — Other Ambulatory Visit: Payer: BC Managed Care – PPO

## 2013-07-24 ENCOUNTER — Other Ambulatory Visit: Payer: BC Managed Care – PPO

## 2013-07-28 ENCOUNTER — Other Ambulatory Visit (INDEPENDENT_AMBULATORY_CARE_PROVIDER_SITE_OTHER): Payer: BC Managed Care – PPO

## 2013-07-28 ENCOUNTER — Ambulatory Visit: Payer: BC Managed Care – PPO | Admitting: Family Medicine

## 2013-07-28 DIAGNOSIS — E1149 Type 2 diabetes mellitus with other diabetic neurological complication: Secondary | ICD-10-CM

## 2013-07-28 DIAGNOSIS — E785 Hyperlipidemia, unspecified: Secondary | ICD-10-CM

## 2013-07-28 LAB — LIPID PANEL
Cholesterol: 216 mg/dL — ABNORMAL HIGH (ref 0–200)
HDL: 44.8 mg/dL (ref >39.00)
LDL CALC: 136 mg/dL — AB (ref 0–99)
NONHDL: 171.2
Total CHOL/HDL Ratio: 5
Triglycerides: 175 mg/dL — ABNORMAL HIGH (ref 0.0–149.0)
VLDL: 35 mg/dL (ref 0.0–40.0)

## 2013-07-28 LAB — HEMOGLOBIN A1C: HEMOGLOBIN A1C: 7.7 % — AB (ref 4.6–6.5)

## 2013-07-31 ENCOUNTER — Ambulatory Visit (INDEPENDENT_AMBULATORY_CARE_PROVIDER_SITE_OTHER): Payer: BC Managed Care – PPO | Admitting: Family Medicine

## 2013-07-31 ENCOUNTER — Encounter: Payer: Self-pay | Admitting: Family Medicine

## 2013-07-31 VITALS — BP 158/98 | HR 84 | Temp 99.0°F | Resp 18 | Ht 72.0 in | Wt 228.0 lb

## 2013-07-31 DIAGNOSIS — E785 Hyperlipidemia, unspecified: Secondary | ICD-10-CM

## 2013-07-31 DIAGNOSIS — E1149 Type 2 diabetes mellitus with other diabetic neurological complication: Secondary | ICD-10-CM

## 2013-07-31 DIAGNOSIS — IMO0001 Reserved for inherently not codable concepts without codable children: Secondary | ICD-10-CM

## 2013-07-31 DIAGNOSIS — R030 Elevated blood-pressure reading, without diagnosis of hypertension: Secondary | ICD-10-CM

## 2013-07-31 MED ORDER — ATORVASTATIN CALCIUM 40 MG PO TABS: 40.0000 mg | ORAL_TABLET | Freq: Every day | ORAL | Status: DC

## 2013-07-31 NOTE — Assessment & Plan Note (Addendum)
Continue current med. I agreed to just see how more aggressive diet/wt loss helps this over the next several months. Feet exam today pretty unremarkable, just a hint of diminished sensation over MTP of great toe bilat on plantar surface region. I reminded him he needs to get diab retpthy screening exam and I asked him to make it a priority to get this done within the next 17mo.

## 2013-07-31 NOTE — Patient Instructions (Signed)
Get your eyes examined for signs of diabetes changes sometime over the next 6 mo.

## 2013-07-31 NOTE — Progress Notes (Signed)
OFFICE NOTE  07/31/2013  CC:  Chief Complaint  Patient presents with  . Follow-up   HPI: Patient is a 58 y.o. Caucasian male who is here for 4 mo f/u DM 2 and hyperlipidemia. Reviewed recent labs in detail today.  He has not really been making great strides in the area of diet/exercise. He is a Office manager, very busy and successful. He has been hesitant in the past with taking meds for his problems: apprehensive about potential side effects.  Feet: no tingling or pain.  Occ numbness at times on balls of his feet.  Occ home bp check is normal.  It is usually a little high here.  Pertinent PMH:  Past medical, surgical, social, and family history reviewed and no changes are noted since last office visit.  MEDS: Not taking metformin listed below Outpatient Prescriptions Prior to Visit  Medication Sig Dispense Refill  . Cholecalciferol (VITAMIN D3) 1000 UNITS CAPS Take 1 capsule by mouth daily.      . Chromium 400 MCG TABS Take 1 tablet by mouth daily.      Marland Kitchen CINNAMON PO Take by mouth.      . Cyanocobalamin (B-12) 1000 MCG CAPS Take 1 capsule by mouth daily.      Marland Kitchen EPINEPHrine 0.3 mg/0.3 mL IJ SOAJ injection Inject 0.3 mLs (0.3 mg total) into the muscle once.  2 Device  1  . Krill Oil 500 MG CAPS Take 1 capsule by mouth daily.      . Multiple Vitamins-Minerals (CENTRUM PO) Take 1 tablet by mouth daily.        . Multiple Vitamins-Minerals (ZINC PO) Take 1 tablet by mouth daily.      . NON FORMULARY Diabetic MVit Pak      . NON FORMULARY CinSulin.  Cinnamon Bark Extract.  500 mg daily      . vitamin C (ASCORBIC ACID) 500 MG tablet Take 500 mg by mouth as needed.        Marland Kitchen acetaminophen (TYLENOL) 500 MG tablet Take 500 mg by mouth every 6 (six) hours as needed.        Marland Kitchen aspirin 81 MG tablet Take 81 mg by mouth daily.      Marland Kitchen glucose blood (FREESTYLE LITE) test strip Use to check blood sugar twice daily as directed  100 each  5  . Lancet Devices (B-D LANCET DEVICE) MISC Use as directed to  check blood sugars 2 times per day  60 each  5  . metFORMIN (GLUCOPHAGE) 500 MG tablet TAKE TWO TABLETS BY MOUTH TWICE DAILY WITH  A  MEAL  120 tablet  3  . oxyCODONE (OXY IR/ROXICODONE) 5 MG immediate release tablet 1-2 tabs po q6h prn pain  40 tablet  0   No facility-administered medications prior to visit.    PE: Blood pressure 158/98, pulse 84, temperature 99 F (37.2 C), temperature source Temporal, resp. rate 18, height 6' (1.829 m), weight 228 lb (103.42 kg), SpO2 99.00%. Gen: Alert, well appearing.  Patient is oriented to person, place, time, and situation. AFFECT: pleasant, lucid thought and speech.  Foot exam -  no swelling, tenderness or skin or vascular lesions.  Right great toe medial surface with thick hogskin graft in place.  Color and temperature is normal. Sensation is intact: just a hint of diminished reaction to monofilament testing on balls of his great toes. Peripheral pulses are palpable. Toenails are normal.  LAB:  Lab Results  Component Value Date   HGBA1C 7.7* 07/28/2013  Lab Results  Component Value Date   CHOL 216* 07/28/2013   HDL 44.80 07/28/2013   LDLCALC 136* 07/28/2013   LDLDIRECT 156.2 09/26/2012   TRIG 175.0* 07/28/2013   CHOLHDL 5 07/28/2013     Chemistry      Component Value Date/Time   NA 138 03/20/2013 1414   K 4.3 03/20/2013 1414   CL 102 03/20/2013 1414   CO2 30 03/20/2013 1414   BUN 17 03/20/2013 1414   CREATININE 0.7 03/20/2013 1414   CREATININE 0.78 09/12/2010 1419      Component Value Date/Time   CALCIUM 9.2 03/20/2013 1414   ALKPHOS 43 03/20/2013 1414   AST 20 03/20/2013 1414   ALT 21 03/20/2013 1414   BILITOT 0.8 03/20/2013 1414     IMPRESSION AND PLAN:  Type II or unspecified type diabetes mellitus with neurological manifestations, not stated as uncontrolled(250.60) Continue current med. I agreed to just see how more aggressive diet/wt loss helps this over the next several months. Feet exam today pretty unremarkable, just a hint of  diminished sensation over MTP of great toe bilat on plantar surface region. I reminded him he needs to get diab retpthy screening exam and I asked him to make it a priority to get this done within the next 85mo.  Hyperlipidemia Time to start statin.  He agreed to this today: start atorvastatin 40mg  po qd.  Therapeutic expectations and side effect profile of medication discussed today.  Patient's questions answered. He will continue with attempts at a more consistent/better diet + exercise and wt loss. Recheck FLP/office f/u in 3 mo.  White coat hypertension Continue periodic home bp monitoring.   An After Visit Summary was printed and given to the patient.  FOLLOW UP: 3 mo--repeat A1c and FLP at that time.

## 2013-07-31 NOTE — Progress Notes (Signed)
Pre visit review using our clinic review tool, if applicable. No additional management support is needed unless otherwise documented below in the visit note. 

## 2013-07-31 NOTE — Assessment & Plan Note (Addendum)
Time to start statin.  He agreed to this today: start atorvastatin 40mg  po qd.  Therapeutic expectations and side effect profile of medication discussed today.  Patient's questions answered. He will continue with attempts at a more consistent/better diet + exercise and wt loss. Recheck FLP/office f/u in 3 mo.

## 2013-07-31 NOTE — Assessment & Plan Note (Signed)
Continue periodic home bp monitoring.

## 2013-08-18 ENCOUNTER — Encounter: Payer: Self-pay | Admitting: Family Medicine

## 2013-10-19 ENCOUNTER — Other Ambulatory Visit: Payer: Self-pay | Admitting: Family Medicine

## 2013-10-19 MED ORDER — METFORMIN HCL 500 MG PO TABS: ORAL_TABLET | ORAL | Status: DC

## 2013-10-23 ENCOUNTER — Other Ambulatory Visit (INDEPENDENT_AMBULATORY_CARE_PROVIDER_SITE_OTHER): Payer: BC Managed Care – PPO

## 2013-10-23 DIAGNOSIS — E1149 Type 2 diabetes mellitus with other diabetic neurological complication: Secondary | ICD-10-CM

## 2013-10-23 LAB — LIPID PANEL
CHOLESTEROL: 113 mg/dL (ref 0–200)
HDL: 39.5 mg/dL (ref >39.00)
LDL CALC: 64 mg/dL (ref 0–99)
NonHDL: 73.5
Total CHOL/HDL Ratio: 3
Triglycerides: 50 mg/dL (ref 0.0–149.0)
VLDL: 10 mg/dL (ref 0.0–40.0)

## 2013-10-23 LAB — HEMOGLOBIN A1C: HEMOGLOBIN A1C: 7.4 % — AB (ref 4.6–6.5)

## 2013-10-30 ENCOUNTER — Encounter: Payer: Self-pay | Admitting: Family Medicine

## 2013-10-30 ENCOUNTER — Ambulatory Visit (INDEPENDENT_AMBULATORY_CARE_PROVIDER_SITE_OTHER): Payer: BC Managed Care – PPO | Admitting: Family Medicine

## 2013-10-30 VITALS — BP 154/100 | HR 73 | Temp 97.8°F | Resp 18 | Ht 72.0 in | Wt 223.0 lb

## 2013-10-30 DIAGNOSIS — R030 Elevated blood-pressure reading, without diagnosis of hypertension: Secondary | ICD-10-CM

## 2013-10-30 DIAGNOSIS — Z23 Encounter for immunization: Secondary | ICD-10-CM

## 2013-10-30 DIAGNOSIS — E1140 Type 2 diabetes mellitus with diabetic neuropathy, unspecified: Secondary | ICD-10-CM

## 2013-10-30 DIAGNOSIS — Z135 Encounter for screening for eye and ear disorders: Secondary | ICD-10-CM

## 2013-10-30 LAB — MICROALBUMIN / CREATININE URINE RATIO
Creatinine,U: 109.9 mg/dL
Microalb Creat Ratio: 1.5 mg/g (ref 0.0–30.0)
Microalb, Ur: 1.6 mg/dL (ref 0.0–1.9)

## 2013-10-30 MED ORDER — ATORVASTATIN CALCIUM 40 MG PO TABS: 40.0000 mg | ORAL_TABLET | Freq: Every day | ORAL | Status: DC

## 2013-10-30 NOTE — Progress Notes (Signed)
OFFICE NOTE  10/30/2013  CC:  Chief Complaint  Patient presents with  . Follow-up   HPI: Patient is a 58 y.o. Caucasian male who is here for 3 mo f/u DM 2 and hyperlipidemia. Started 40mg  atorv last visit. Reviewed recent recheck of FLP and A1c today. He is happy with cholest #'s. Wants to continue tightening up dietary changes/wt loss in order to get A1c down rather than add any additional meds.  Pertinent PMH:  Past surgical, social, and family history reviewed and no changes noted since last office visit.  MEDS:  Outpatient Prescriptions Prior to Visit  Medication Sig Dispense Refill  . atorvastatin (LIPITOR) 40 MG tablet Take 1 tablet (40 mg total) by mouth daily.  30 tablet  3  . Cholecalciferol (VITAMIN D3) 1000 UNITS CAPS Take 1 capsule by mouth daily.      . Chromium 400 MCG TABS Take 1 tablet by mouth daily.      Marland Kitchen. CINNAMON PO Take by mouth.      . Cyanocobalamin (B-12) 1000 MCG CAPS Take 1 capsule by mouth daily.      Marland Kitchen. EPINEPHrine 0.3 mg/0.3 mL IJ SOAJ injection Inject 0.3 mLs (0.3 mg total) into the muscle once.  2 Device  1  . Krill Oil 500 MG CAPS Take 1 capsule by mouth daily.      . metFORMIN (GLUCOPHAGE) 500 MG tablet TAKE TWO TABLETS BY MOUTH TWICE DAILY WITH  A  MEAL  120 tablet  3  . Multiple Vitamins-Minerals (CENTRUM PO) Take 1 tablet by mouth daily.        . Multiple Vitamins-Minerals (ZINC PO) Take 1 tablet by mouth daily.      . NON FORMULARY Diabetic MVit Pak      . NON FORMULARY CinSulin.  Cinnamon Bark Extract.  500 mg daily      . vitamin C (ASCORBIC ACID) 500 MG tablet Take 500 mg by mouth as needed.        Marland Kitchen. aspirin 81 MG tablet Take 81 mg by mouth daily.      Marland Kitchen. glucose blood (FREESTYLE LITE) test strip Use to check blood sugar twice daily as directed  100 each  5  . Lancet Devices (B-D LANCET DEVICE) MISC Use as directed to check blood sugars 2 times per day  60 each  5  . acetaminophen (TYLENOL) 500 MG tablet Take 500 mg by mouth every 6 (six)  hours as needed.        Marland Kitchen. oxyCODONE (OXY IR/ROXICODONE) 5 MG immediate release tablet 1-2 tabs po q6h prn pain  40 tablet  0   No facility-administered medications prior to visit.    PE: Blood pressure 154/100, pulse 73, temperature 97.8 F (36.6 C), temperature source Temporal, resp. rate 18, height 6' (1.829 m), weight 223 lb (101.152 kg), SpO2 100.00%. Repeat BP left arm with manual cuff 132/82. Gen: Alert, well appearing.  Patient is oriented to person, place, time, and situation. CV: RRR, no m/r/g.   LUNGS: CTA bilat, nonlabored resps, good aeration in all lung fields.  LAB: none today:  Lab Results  Component Value Date   HGBA1C 7.4* 10/23/2013   Lab Results  Component Value Date   CHOL 113 10/23/2013   HDL 39.50 10/23/2013   LDLCALC 64 10/23/2013   LDLDIRECT 156.2 09/26/2012   TRIG 50.0 10/23/2013   CHOLHDL 3 10/23/2013    IMPRESSION AND PLAN:  1) DM 2 with diab PN, control is fair. Continue to tighten diet/exercise/wt  loss: no med changes today. Urine microalb/cr today. Referred pt to Fred[facility] Birks[facility] Scott, [facility] for diab retpthy screening exam. Flu vaccine IM today  2) Hyperlipidemia: great response to atorva 40mg  qd. Continue this med + TLC.  3) Elevated bp w/out dx of HTN: white coat HTN.  BP recheck with manual cuff after visit today was normal.  An After Visit Summary was printed and given to the patient.  FOLLOW UP: 4 mo

## 2013-10-30 NOTE — Progress Notes (Signed)
Pre visit review using our clinic review tool, if applicable. No additional management support is needed unless otherwise documented below in the visit note. 

## 2013-10-31 ENCOUNTER — Encounter: Payer: Self-pay | Admitting: Family Medicine

## 2014-01-05 DIAGNOSIS — R972 Elevated prostate specific antigen [PSA]: Secondary | ICD-10-CM

## 2014-01-05 HISTORY — DX: Elevated prostate specific antigen (PSA): R97.20

## 2014-02-19 ENCOUNTER — Other Ambulatory Visit: Payer: Self-pay | Admitting: Family Medicine

## 2014-02-19 NOTE — Telephone Encounter (Signed)
Please advise 

## 2014-02-26 ENCOUNTER — Other Ambulatory Visit (INDEPENDENT_AMBULATORY_CARE_PROVIDER_SITE_OTHER): Payer: BC Managed Care – PPO

## 2014-02-26 DIAGNOSIS — E119 Type 2 diabetes mellitus without complications: Secondary | ICD-10-CM

## 2014-02-26 LAB — HEMOGLOBIN A1C: HEMOGLOBIN A1C: 8.7 % — AB (ref 4.6–6.5)

## 2014-02-27 LAB — COMPREHENSIVE METABOLIC PANEL
ALBUMIN: 4.2 g/dL (ref 3.5–5.2)
ALT: 21 U/L (ref 0–53)
AST: 17 U/L (ref 0–37)
Alkaline Phosphatase: 62 U/L (ref 39–117)
BUN: 12 mg/dL (ref 6–23)
CHLORIDE: 102 meq/L (ref 96–112)
CO2: 29 mEq/L (ref 19–32)
Calcium: 9.4 mg/dL (ref 8.4–10.5)
Creatinine, Ser: 0.66 mg/dL (ref 0.40–1.50)
GFR: 131.56 mL/min (ref >60.00)
GLUCOSE: 165 mg/dL — AB (ref 70–99)
POTASSIUM: 4.4 meq/L (ref 3.5–5.1)
Sodium: 139 mEq/L (ref 135–145)
Total Bilirubin: 0.5 mg/dL (ref 0.2–1.2)
Total Protein: 7.1 g/dL (ref 6.0–8.3)

## 2014-03-05 ENCOUNTER — Ambulatory Visit (INDEPENDENT_AMBULATORY_CARE_PROVIDER_SITE_OTHER): Payer: BC Managed Care – PPO | Admitting: Family Medicine

## 2014-03-05 ENCOUNTER — Encounter: Payer: Self-pay | Admitting: Family Medicine

## 2014-03-05 VITALS — BP 171/103 | HR 79 | Temp 98.1°F | Resp 18 | Ht 72.0 in | Wt 223.0 lb

## 2014-03-05 DIAGNOSIS — Z135 Encounter for screening for eye and ear disorders: Secondary | ICD-10-CM

## 2014-03-05 DIAGNOSIS — Z1211 Encounter for screening for malignant neoplasm of colon: Secondary | ICD-10-CM

## 2014-03-05 DIAGNOSIS — E1149 Type 2 diabetes mellitus with other diabetic neurological complication: Secondary | ICD-10-CM

## 2014-03-05 DIAGNOSIS — E1140 Type 2 diabetes mellitus with diabetic neuropathy, unspecified: Secondary | ICD-10-CM

## 2014-03-05 DIAGNOSIS — I10 Essential (primary) hypertension: Secondary | ICD-10-CM

## 2014-03-05 DIAGNOSIS — E785 Hyperlipidemia, unspecified: Secondary | ICD-10-CM

## 2014-03-05 MED ORDER — AMOXICILLIN 875 MG PO TABS: 875.0000 mg | ORAL_TABLET | Freq: Two times a day (BID) | ORAL | Status: AC

## 2014-03-05 NOTE — Progress Notes (Signed)
OFFICE NOTE  03/05/2014  CC:  Chief Complaint  Patient presents with  . Follow-up     HPI: Patient is a 59 y.o. Caucasian male who is here for 4 mo f/u DM 2, hyperlipidemia, hx of white coat HTN. Reviewed recent labs with pt in detail today (see below). Admits to frequent dietary indiscretion and lack of exercise since last visit.  Wt is unchanged. He says his feet look and feel great.   Last 2 wks nasal congestion/mucous, facial pressure and soreness/peri-orbital HA, subjective fever on and off. Feels like he is getting a bit better but not much.   Pertinent PMH:  Past medical, surgical, social, and family history reviewed and no changes are noted since last office visit.  MEDS:  Outpatient Prescriptions Prior to Visit  Medication Sig Dispense Refill  . aspirin 81 MG tablet Take 81 mg by mouth daily.    Marland Kitchen atorvastatin (LIPITOR) 40 MG tablet Take 1 tablet (40 mg total) by mouth daily. 90 tablet 3  . Cholecalciferol (VITAMIN D3) 1000 UNITS CAPS Take 1 capsule by mouth daily.    . Chromium 400 MCG TABS Take 1 tablet by mouth daily.    Marland Kitchen CINNAMON PO Take by mouth.    . Cyanocobalamin (B-12) 1000 MCG CAPS Take 1 capsule by mouth daily.    Marland Kitchen EPINEPHrine 0.3 mg/0.3 mL IJ SOAJ injection Inject 0.3 mLs (0.3 mg total) into the muscle once. 2 Device 1  . glucose blood (FREESTYLE LITE) test strip Use to check blood sugar twice daily as directed 100 each 5  . Krill Oil 500 MG CAPS Take 1 capsule by mouth daily.    Demetra Shiner Devices (B-D LANCET DEVICE) MISC Use as directed to check blood sugars 2 times per day 60 each 5  . metFORMIN (GLUCOPHAGE) 500 MG tablet TAKE TWO TABLETS BY MOUTH TWICE DAILY WITH MEALS 120 tablet 6  . Multiple Vitamins-Minerals (CENTRUM PO) Take 1 tablet by mouth daily.      . Multiple Vitamins-Minerals (ZINC PO) Take 1 tablet by mouth daily.    . NON FORMULARY Diabetic MVit Pak    . NON FORMULARY CinSulin.  Cinnamon Bark Extract.  500 mg daily    . vitamin C  (ASCORBIC ACID) 500 MG tablet Take 500 mg by mouth as needed.       No facility-administered medications prior to visit.    PE: Blood pressure 171/103, pulse 79, temperature 98.1 F (36.7 C), temperature source Temporal, resp. rate 18, height 6' (1.829 m), weight 223 lb (101.152 kg), SpO2 99 %. Gen: Alert, well appearing.  Patient is oriented to person, place, time, and situation. AFFECT: pleasant, lucid thought and speech. No further exam today.  LABS: none today RECENT: Lab Results  Component Value Date   HGBA1C 8.7* 02/26/2014   Lab Results  Component Value Date   CHOL 113 10/23/2013   HDL 39.50 10/23/2013   LDLCALC 64 10/23/2013   LDLDIRECT 156.2 09/26/2012   TRIG 50.0 10/23/2013   CHOLHDL 3 10/23/2013     Chemistry      Component Value Date/Time   NA 139 02/26/2014 1041   K 4.4 02/26/2014 1041   CL 102 02/26/2014 1041   CO2 29 02/26/2014 1041   BUN 12 02/26/2014 1041   CREATININE 0.66 02/26/2014 1041   CREATININE 0.78 09/12/2010 1419      Component Value Date/Time   CALCIUM 9.4 02/26/2014 1041   ALKPHOS 62 02/26/2014 1041   AST 17 02/26/2014 1041  ALT 21 02/26/2014 1041   BILITOT 0.5 02/26/2014 1041      IMPRESSION AND PLAN:  1) Diabetes with DPN, hx of foot ulcer. Poor control lately due to patient noncompliance with diet and exercise. He declines any change in med approach at this time b/c he feels like he can get back on his strict diabetic diet and restart exercise and regain control this way.  I'm ok with this b/c he has been able to do this in the past, but he agrees that if next A1c not improved then additional med will be needed.   He didn't get eye exam I referred him for last visit so I re-entered this order today (Dr. Macarthur Critchley, optometry).  2) Hyperlipidemia: tolerating statin.  AST/ALT recently normal. Plan on recheck Concepcion 10/2014.  3) White coat HTN; patient occ monitors home bp and says it is always normal. Continue home bp  monitoring.  4) Colon ca screening: he is continuing to push off colonoscopy--lots of excuses.  He is agreeable to iFOB in the meantime so we handed him a kit today.  Of note, pt declined HIV screening; says he has had this in the past and declines repeat.  An After Visit Summary was printed and given to the patient.  FOLLOW UP: 3 mo

## 2014-03-05 NOTE — Progress Notes (Signed)
Pre visit review using our clinic review tool, if applicable. No additional management support is needed unless otherwise documented below in the visit note. 

## 2014-03-05 NOTE — Patient Instructions (Signed)
Try OTC generic nasal spray: 2-3 sprays each nostril and blow, several times a day.

## 2014-03-06 ENCOUNTER — Telehealth: Payer: Self-pay | Admitting: Family Medicine

## 2014-03-06 NOTE — Telephone Encounter (Signed)
emmi emailed °

## 2014-03-23 ENCOUNTER — Other Ambulatory Visit: Payer: BC Managed Care – PPO

## 2014-03-23 DIAGNOSIS — Z1211 Encounter for screening for malignant neoplasm of colon: Secondary | ICD-10-CM

## 2014-03-26 ENCOUNTER — Other Ambulatory Visit (INDEPENDENT_AMBULATORY_CARE_PROVIDER_SITE_OTHER): Payer: BC Managed Care – PPO

## 2014-03-26 DIAGNOSIS — Z1211 Encounter for screening for malignant neoplasm of colon: Secondary | ICD-10-CM

## 2014-03-26 LAB — FECAL OCCULT BLOOD, IMMUNOCHEMICAL: FECAL OCCULT BLD: NEGATIVE

## 2014-03-27 ENCOUNTER — Encounter: Payer: Self-pay | Admitting: Family Medicine

## 2014-05-25 ENCOUNTER — Ambulatory Visit (INDEPENDENT_AMBULATORY_CARE_PROVIDER_SITE_OTHER): Payer: BC Managed Care – PPO | Admitting: Family Medicine

## 2014-05-25 ENCOUNTER — Encounter: Payer: Self-pay | Admitting: Family Medicine

## 2014-05-25 VITALS — BP 169/104 | HR 95 | Temp 98.4°F | Resp 16 | Wt 218.0 lb

## 2014-05-25 DIAGNOSIS — Z7251 High risk heterosexual behavior: Secondary | ICD-10-CM | POA: Diagnosis not present

## 2014-05-25 DIAGNOSIS — Z202 Contact with and (suspected) exposure to infections with a predominantly sexual mode of transmission: Secondary | ICD-10-CM | POA: Diagnosis not present

## 2014-05-25 MED ORDER — METRONIDAZOLE 500 MG PO TABS: ORAL_TABLET | ORAL | Status: DC

## 2014-05-25 NOTE — Progress Notes (Signed)
Pre visit review using our clinic review tool, if applicable. No additional management support is needed unless otherwise documented below in the visit note. 

## 2014-05-25 NOTE — Progress Notes (Signed)
OFFICE NOTE  05/25/2014  CC:  Chief Complaint  Patient presents with  . Advice Only    Personal discussion with Dr. Anitra Lauth.     HPI: Patient is a 59 y.o. Caucasian male who is here for "personal discussion". Recent separation from wife x 8-9 months. Recent intercourse with a girlfriend who he says likely has other boyfriends. She called him and told him she was diagnosed with an "std" and said she couldn't remember what the name of it was but says she was rx'd a med that sounded like "met-something".  Presumably metronidales for trichomonal vaginitis (she apparently had very itchy vag discharge). He denies personal hx of STD but says he has never been testing.  Denies any penile discharge or pain or lesion.  No dysuria.  Pertinent PMH:  Past medical, surgical, social, and family history reviewed and no changes are noted since last office visit.  MEDS:  Outpatient Prescriptions Prior to Visit  Medication Sig Dispense Refill  . atorvastatin (LIPITOR) 40 MG tablet Take 1 tablet (40 mg total) by mouth daily. 90 tablet 3  . Cholecalciferol (VITAMIN D3) 1000 UNITS CAPS Take 1 capsule by mouth daily.    . Chromium 400 MCG TABS Take 1 tablet by mouth daily.    Marland Kitchen CINNAMON PO Take by mouth.    . Cyanocobalamin (B-12) 1000 MCG CAPS Take 1 capsule by mouth daily.    Marland Kitchen glucose blood (FREESTYLE LITE) test strip Use to check blood sugar twice daily as directed 100 each 5  . Krill Oil 500 MG CAPS Take 1 capsule by mouth daily.    Elmore Guise Devices (B-D LANCET DEVICE) MISC Use as directed to check blood sugars 2 times per day 60 each 5  . metFORMIN (GLUCOPHAGE) 500 MG tablet TAKE TWO TABLETS BY MOUTH TWICE DAILY WITH MEALS 120 tablet 6  . Multiple Vitamins-Minerals (CENTRUM PO) Take 1 tablet by mouth daily.      . Multiple Vitamins-Minerals (ZINC PO) Take 1 tablet by mouth daily.    . NON FORMULARY Diabetic MVit Pak    . NON FORMULARY CinSulin.  Cinnamon Bark Extract.  500 mg daily    . vitamin  C (ASCORBIC ACID) 500 MG tablet Take 500 mg by mouth as needed.      Marland Kitchen aspirin 81 MG tablet Take 81 mg by mouth daily.    Marland Kitchen EPINEPHrine 0.3 mg/0.3 mL IJ SOAJ injection Inject 0.3 mLs (0.3 mg total) into the muscle once. 2 Device 1   No facility-administered medications prior to visit.    PE: Blood pressure 169/104, pulse 95, temperature 98.4 F (36.9 C), temperature source Oral, resp. rate 16, weight 218 lb (98.884 kg), SpO2 99 %. Gen: Alert, well appearing.  Patient is oriented to person, place, time, and situation. No further exam today.  IMPRESSION AND PLAN:  1) Exposure to STD (if you can count trich as an STD); + high risk sexual behavior (he used condoms but admits a couple of times it fell off). He is very nervous, swears he is going to abstain from sex, may even get back with his wife. I gave empiric treatment for trichomonas today: flagyl 2g x 1 dose.  Warned pt not to drink alcohol within 1-2 days of taking this--he says he does not drink any alcohol ever.  I also screened him for GC/Chlam (urine probe) and did HIV and RPR today. Signs/symptoms to call or return for were reviewed and pt expressed understanding.  An After Visit Summary  was printed and given to the patient.  FOLLOW UP: keep routine f/u appt already scheduled.

## 2014-05-26 LAB — GC/CHLAMYDIA PROBE AMP, URINE
Chlamydia, Swab/Urine, PCR: NEGATIVE
GC Probe Amp, Urine: NEGATIVE

## 2014-05-26 LAB — RPR

## 2014-05-26 LAB — HIV ANTIBODY (ROUTINE TESTING W REFLEX): HIV: NONREACTIVE

## 2014-06-11 ENCOUNTER — Ambulatory Visit: Payer: BC Managed Care – PPO | Admitting: Family Medicine

## 2014-06-18 ENCOUNTER — Other Ambulatory Visit (INDEPENDENT_AMBULATORY_CARE_PROVIDER_SITE_OTHER): Payer: BC Managed Care – PPO

## 2014-06-18 DIAGNOSIS — E119 Type 2 diabetes mellitus without complications: Secondary | ICD-10-CM

## 2014-06-18 DIAGNOSIS — Z125 Encounter for screening for malignant neoplasm of prostate: Secondary | ICD-10-CM | POA: Diagnosis not present

## 2014-06-18 DIAGNOSIS — E785 Hyperlipidemia, unspecified: Secondary | ICD-10-CM

## 2014-06-18 LAB — LIPID PANEL
Cholesterol: 119 mg/dL (ref 0–200)
HDL: 51.6 mg/dL (ref >39.00)
LDL CALC: 51 mg/dL (ref 0–99)
NonHDL: 67.4
TRIGLYCERIDES: 80 mg/dL (ref 0.0–149.0)
Total CHOL/HDL Ratio: 2
VLDL: 16 mg/dL (ref 0.0–40.0)

## 2014-06-18 LAB — COMPREHENSIVE METABOLIC PANEL
ALBUMIN: 4.5 g/dL (ref 3.5–5.2)
ALT: 25 U/L (ref 0–53)
AST: 21 U/L (ref 0–37)
Alkaline Phosphatase: 70 U/L (ref 39–117)
BUN: 22 mg/dL (ref 6–23)
CALCIUM: 9.6 mg/dL (ref 8.4–10.5)
CHLORIDE: 102 meq/L (ref 96–112)
CO2: 30 meq/L (ref 19–32)
Creatinine, Ser: 0.7 mg/dL (ref 0.40–1.50)
GFR: 122.79 mL/min (ref >60.00)
Glucose, Bld: 140 mg/dL — ABNORMAL HIGH (ref 70–99)
POTASSIUM: 4.6 meq/L (ref 3.5–5.1)
Sodium: 138 mEq/L (ref 135–145)
TOTAL PROTEIN: 6.9 g/dL (ref 6.0–8.3)
Total Bilirubin: 0.5 mg/dL (ref 0.2–1.2)

## 2014-06-18 LAB — TSH: TSH: 1.03 u[IU]/mL (ref 0.35–4.50)

## 2014-06-18 LAB — PSA: PSA: 3.63 ng/mL (ref 0.10–4.00)

## 2014-06-18 LAB — HEMOGLOBIN A1C: Hgb A1c MFr Bld: 7.5 % — ABNORMAL HIGH (ref 4.6–6.5)

## 2014-06-19 ENCOUNTER — Telehealth: Payer: Self-pay | Admitting: Family Medicine

## 2014-06-19 NOTE — Telephone Encounter (Signed)
Patient returned Dr. Samul Dada call. Please call him back.

## 2014-06-20 ENCOUNTER — Other Ambulatory Visit: Payer: Self-pay | Admitting: Family Medicine

## 2014-06-20 DIAGNOSIS — R972 Elevated prostate specific antigen [PSA]: Secondary | ICD-10-CM

## 2014-06-20 NOTE — Telephone Encounter (Signed)
Talked with pt: see result note from today.

## 2014-08-09 ENCOUNTER — Encounter: Payer: Self-pay | Admitting: Family Medicine

## 2014-08-20 ENCOUNTER — Telehealth: Payer: Self-pay | Admitting: Family Medicine

## 2014-08-20 NOTE — Telephone Encounter (Signed)
When patient went to urologist the urologist recommended he have a biopsy. The urologist talked to the patient about what will raise PSA results & 2 of the things that he mentioned were bike riding for long periods of time which the patient had done the night before his last test & also riding in the car more than 50,000 miles a year which the patient had also done. Patient would like to have it retested which he will purposely not ride his bike close to the time of his blood draw.

## 2014-08-23 DIAGNOSIS — R972 Elevated prostate specific antigen [PSA]: Secondary | ICD-10-CM | POA: Insufficient documentation

## 2014-08-24 NOTE — Telephone Encounter (Signed)
OK, fine. 

## 2014-08-27 ENCOUNTER — Other Ambulatory Visit (INDEPENDENT_AMBULATORY_CARE_PROVIDER_SITE_OTHER): Payer: BC Managed Care – PPO

## 2014-08-27 DIAGNOSIS — R972 Elevated prostate specific antigen [PSA]: Secondary | ICD-10-CM

## 2014-08-27 LAB — PSA: PSA: 2.72 ng/mL (ref 0.10–4.00)

## 2014-08-28 ENCOUNTER — Encounter: Payer: Self-pay | Admitting: Family Medicine

## 2014-09-21 ENCOUNTER — Other Ambulatory Visit: Payer: Self-pay | Admitting: Family Medicine

## 2014-09-21 NOTE — Telephone Encounter (Signed)
RF request for metformin LOV: 03/05/14 Next ov: None Last written: 02/19/14 #120 w/ 6RF  Spoke to pt and advised him he is due for f/u with Dr. Milinda Cave apt was made for 10/29/14 at 10:30am. I sent in normal qty with 1RF to get him by til apt.

## 2014-10-29 ENCOUNTER — Ambulatory Visit: Payer: BC Managed Care – PPO | Admitting: Family Medicine

## 2014-11-20 ENCOUNTER — Other Ambulatory Visit: Payer: Self-pay | Admitting: Family Medicine

## 2014-11-20 NOTE — Telephone Encounter (Signed)
Pt advised and voiced understanding. Apt made for 12/03/14 at 10:00am.

## 2014-11-20 NOTE — Telephone Encounter (Signed)
I did rx for 1 mo with 2 additional RFs.  Pt needs office f/u for his diabetes in the next 2-3 months.-thx

## 2014-11-20 NOTE — Telephone Encounter (Signed)
RF request for metformin LOV: 03/05/14 Next ov: None - was advised he needs apt for more refills, then cancelled apt on 11/02/14  Last written: 09/21/14 #120 w/ 1RF  Please advise. Thanks.

## 2014-12-03 ENCOUNTER — Encounter: Payer: Self-pay | Admitting: Family Medicine

## 2014-12-03 ENCOUNTER — Ambulatory Visit: Payer: BC Managed Care – PPO | Admitting: Family Medicine

## 2014-12-03 ENCOUNTER — Ambulatory Visit (INDEPENDENT_AMBULATORY_CARE_PROVIDER_SITE_OTHER): Payer: BC Managed Care – PPO | Admitting: Family Medicine

## 2014-12-03 VITALS — BP 155/102 | HR 75 | Temp 98.5°F | Resp 16 | Ht 72.0 in | Wt 224.0 lb

## 2014-12-03 DIAGNOSIS — Z23 Encounter for immunization: Secondary | ICD-10-CM | POA: Diagnosis not present

## 2014-12-03 DIAGNOSIS — IMO0001 Reserved for inherently not codable concepts without codable children: Secondary | ICD-10-CM

## 2014-12-03 DIAGNOSIS — E785 Hyperlipidemia, unspecified: Secondary | ICD-10-CM

## 2014-12-03 DIAGNOSIS — R030 Elevated blood-pressure reading, without diagnosis of hypertension: Secondary | ICD-10-CM | POA: Diagnosis not present

## 2014-12-03 DIAGNOSIS — E118 Type 2 diabetes mellitus with unspecified complications: Secondary | ICD-10-CM | POA: Diagnosis not present

## 2014-12-03 LAB — BASIC METABOLIC PANEL
BUN: 16 mg/dL (ref 6–23)
CO2: 30 meq/L (ref 19–32)
Calcium: 9.7 mg/dL (ref 8.4–10.5)
Chloride: 101 mEq/L (ref 96–112)
Creatinine, Ser: 0.76 mg/dL (ref 0.40–1.50)
GFR: 111.5 mL/min (ref >60.00)
GLUCOSE: 166 mg/dL — AB (ref 70–99)
POTASSIUM: 4.2 meq/L (ref 3.5–5.1)
SODIUM: 139 meq/L (ref 135–145)

## 2014-12-03 LAB — LIPID PANEL
CHOL/HDL RATIO: 2
CHOLESTEROL: 122 mg/dL (ref 0–200)
HDL: 51.8 mg/dL (ref >39.00)
LDL Cholesterol: 57 mg/dL (ref 0–99)
NONHDL: 70.08
Triglycerides: 64 mg/dL (ref 0.0–149.0)
VLDL: 12.8 mg/dL (ref 0.0–40.0)

## 2014-12-03 LAB — HEMOGLOBIN A1C: Hgb A1c MFr Bld: 8.3 % — ABNORMAL HIGH (ref 4.6–6.5)

## 2014-12-03 NOTE — Progress Notes (Signed)
Pre visit review using our clinic review tool, if applicable. No additional management support is needed unless otherwise documented below in the visit note. 

## 2014-12-03 NOTE — Progress Notes (Signed)
OFFICE VISIT  12/03/2014   CC:  Chief Complaint  Patient presents with  . Follow-up    Pt is fasting.     HPI:    Patient is a 59 y.o. Caucasian male who presents for f/u DM 2, HLD, and white coat HTN. I last saw him for f/u of these issues 02/2014. Doing fine.  Fasting today.  Checking glucoses occasionally, says they are "normal".  Diet decent and activity level decent.   He has cut back on riding a road bike due to hx of mildly elevated PSA level that we think was due to this. Feet feel great.  Reports compliance w/meds w/out side effect.    Past Medical History  Diagnosis Date  . DM type 2 (diabetes mellitus, type 2) (HCC) 09/12/10    HbA1c 12.6% at the time of dx.  . Elevated blood pressure reading without diagnosis of hypertension 12/26/2010    White coat HTN  . Toe ulcer due to DM (HCC) fall 2012    Right great toe  . Food allergy Summer 2015    Corn chowder: pt was referred to Allergist but failed to show for his appt.  . Hyperlipidemia   . Increased prostate specific antigen (PSA) velocity 2016    ? due to cycling?  Pt chose repeat PSA instead of Biopsy and it showed lower PSA value when he had not been cycling prior to PSA check.    Past Surgical History  Procedure Laterality Date  . Root canal    . Tonsillectomy      Outpatient Prescriptions Prior to Visit  Medication Sig Dispense Refill  . atorvastatin (LIPITOR) 40 MG tablet Take 1 tablet (40 mg total) by mouth daily. 90 tablet 3  . EPINEPHrine 0.3 mg/0.3 mL IJ SOAJ injection Inject 0.3 mLs (0.3 mg total) into the muscle once. 2 Device 1  . glucose blood (FREESTYLE LITE) test strip Use to check blood sugar twice daily as directed 100 each 5  . Krill Oil 500 MG CAPS Take 1 capsule by mouth daily.    Demetra Shiner Devices (B-D LANCET DEVICE) MISC Use as directed to check blood sugars 2 times per day 60 each 5  . metFORMIN (GLUCOPHAGE) 500 MG tablet TAKE TWO TABLETS BY MOUTH TWICE DAILY WITH  MEALS 120 tablet 2  .  Multiple Vitamins-Minerals (CENTRUM PO) Take 1 tablet by mouth daily.      . NON FORMULARY Diabetic MVit Pak    . aspirin 81 MG tablet Take 81 mg by mouth daily.    . Cholecalciferol (VITAMIN D3) 1000 UNITS CAPS Take 1 capsule by mouth daily.    . Chromium 400 MCG TABS Take 1 tablet by mouth daily.    Marland Kitchen CINNAMON PO Take by mouth.    . Cyanocobalamin (B-12) 1000 MCG CAPS Take 1 capsule by mouth daily.    . metroNIDAZOLE (FLAGYL) 500 MG tablet 4 tabs po qd x 1 dose (Patient not taking: Reported on 12/03/2014) 4 tablet 0  . Multiple Vitamins-Minerals (ZINC PO) Take 1 tablet by mouth daily.    . NON FORMULARY CinSulin.  Cinnamon Bark Extract.  500 mg daily    . vitamin C (ASCORBIC ACID) 500 MG tablet Take 500 mg by mouth as needed.       No facility-administered medications prior to visit.    No Known Allergies  ROS As per HPI  PE: Blood pressure 155/102, pulse 75, temperature 98.5 F (36.9 C), temperature source Oral, resp. rate 16,  height 6' (1.829 m), weight 224 lb (101.606 kg), SpO2 98 %. Gen: Alert, well appearing.  Patient is oriented to person, place, time, and situation. CV: RRR, no m/r/g.   LUNGS: CTA bilat, nonlabored resps, good aeration in all lung fields. Foot exam - bilateral normal; no swelling, tenderness or skin or vascular lesions. Color and temperature is normal. Sensation is intact with monofilament testing except for distal aspect of 1st and 2nd toes on R foot and distal aspect of 2nd toe on L foot.  Large non-inflamed callus on lateral/inferior aspect of 1st toe on R foot. Peripheral pulses are palpable. Toenails are normal.  LABS:  Lab Results  Component Value Date   HGBA1C 7.5* 06/18/2014   Lab Results  Component Value Date   TSH 1.03 06/18/2014   Lab Results  Component Value Date   WBC 8.1 03/20/2013   HGB 13.9 03/20/2013   HCT 42.4 03/20/2013   MCV 86.0 03/20/2013   PLT 220.0 03/20/2013   Lab Results  Component Value Date   CREATININE 0.70  06/18/2014   BUN 22 06/18/2014   NA 138 06/18/2014   K 4.6 06/18/2014   CL 102 06/18/2014   CO2 30 06/18/2014   Lab Results  Component Value Date   ALT 25 06/18/2014   AST 21 06/18/2014   ALKPHOS 70 06/18/2014   BILITOT 0.5 06/18/2014   Lab Results  Component Value Date   CHOL 119 06/18/2014   Lab Results  Component Value Date   HDL 51.60 06/18/2014   Lab Results  Component Value Date   LDLCALC 51 06/18/2014   Lab Results  Component Value Date   TRIG 80.0 06/18/2014   Lab Results  Component Value Date   CHOLHDL 2 06/18/2014   Lab Results  Component Value Date   PSA 2.72 08/27/2014   PSA 3.63 06/18/2014   PSA 2.37 03/20/2013   IMPRESSION AND PLAN:  1) DM 2, hx of good control. Hba1c due today, as is urine microalb/cr. Feet exam today: a couple small areas of loss of protective sensation--actually improved over prior exams. Continue current meds, diet, activity. Flu vaccine today.  2) HLD: The current medical regimen is effective;  continue present plan and medications. FLP today.  3) White coat HTN: monitor at home and call if persistently > 140/90.  4) Hx of elevated PSA: level came down with cessation of riding road bike. Plan on recheck PSA at next f/u 82mo.  An After Visit Summary was printed and given to the patient.  FOLLOW UP: Return in about 6 months (around 06/02/2015) for routine chronic illness f/u.

## 2014-12-04 LAB — MICROALBUMIN / CREATININE URINE RATIO
Creatinine,U: 83.5 mg/dL
MICROALB/CREAT RATIO: 4.9 mg/g (ref 0.0–30.0)
Microalb, Ur: 4.1 mg/dL — ABNORMAL HIGH (ref 0.0–1.9)

## 2014-12-05 ENCOUNTER — Encounter: Payer: Self-pay | Admitting: Family Medicine

## 2014-12-05 ENCOUNTER — Other Ambulatory Visit: Payer: Self-pay | Admitting: *Deleted

## 2014-12-05 DIAGNOSIS — E118 Type 2 diabetes mellitus with unspecified complications: Secondary | ICD-10-CM

## 2014-12-05 MED ORDER — PIOGLITAZONE HCL 15 MG PO TABS: 15.0000 mg | ORAL_TABLET | Freq: Every day | ORAL | Status: DC

## 2015-01-15 ENCOUNTER — Other Ambulatory Visit: Payer: Self-pay | Admitting: Family Medicine

## 2015-02-18 ENCOUNTER — Ambulatory Visit (INDEPENDENT_AMBULATORY_CARE_PROVIDER_SITE_OTHER): Payer: BC Managed Care – PPO | Admitting: Family Medicine

## 2015-02-18 ENCOUNTER — Encounter: Payer: Self-pay | Admitting: Family Medicine

## 2015-02-18 VITALS — BP 147/93 | HR 113 | Temp 98.6°F | Resp 16 | Ht 72.0 in | Wt 218.5 lb

## 2015-02-18 DIAGNOSIS — J018 Other acute sinusitis: Secondary | ICD-10-CM

## 2015-02-18 DIAGNOSIS — J069 Acute upper respiratory infection, unspecified: Secondary | ICD-10-CM

## 2015-02-18 DIAGNOSIS — B9789 Other viral agents as the cause of diseases classified elsewhere: Secondary | ICD-10-CM

## 2015-02-18 DIAGNOSIS — J209 Acute bronchitis, unspecified: Secondary | ICD-10-CM

## 2015-02-18 MED ORDER — PREDNISONE 20 MG PO TABS: ORAL_TABLET | ORAL | Status: DC

## 2015-02-18 MED ORDER — AZITHROMYCIN 250 MG PO TABS: ORAL_TABLET | ORAL | Status: DC

## 2015-02-18 NOTE — Progress Notes (Signed)
Pre visit review using our clinic review tool, if applicable. No additional management support is needed unless otherwise documented below in the visit note. 

## 2015-02-18 NOTE — Progress Notes (Signed)
OFFICE NOTE  02/18/2015  CC:  Chief Complaint  Patient presents with  . URI    x 2-3 weeks     HPI: Patient is a 60 y.o. Caucasian male who is here for respiratory complaints.   Two-three weeks of sinus/nasal congestion, some ear ache, lots of coughing and this part is getting worse. Subj fever at night.  Occ ST/HA.  Some post-tussive emesis this morning, some nausea from all the mucous he is swallowing.  Chest feels tight.  Has a dry/harshness to his cough.  Mucinex tried consistently.   Pertinent PMH:  Past medical, surgical, social, and family history reviewed and no changes are noted since last office visit.  MEDS:  Outpatient Prescriptions Prior to Visit  Medication Sig Dispense Refill  . atorvastatin (LIPITOR) 40 MG tablet TAKE ONE TABLET BY MOUTH ONCE DAILY 90 tablet 0  . EPINEPHrine 0.3 mg/0.3 mL IJ SOAJ injection Inject 0.3 mLs (0.3 mg total) into the muscle once. 2 Device 1  . glucose blood (FREESTYLE LITE) test strip Use to check blood sugar twice daily as directed 100 each 5  . Krill Oil 500 MG CAPS Take 1 capsule by mouth daily.    Demetra Shiner Devices (B-D LANCET DEVICE) MISC Use as directed to check blood sugars 2 times per day 60 each 5  . metFORMIN (GLUCOPHAGE) 500 MG tablet TAKE TWO TABLETS BY MOUTH TWICE DAILY WITH  MEALS 120 tablet 2  . Multiple Vitamins-Minerals (CENTRUM PO) Take 1 tablet by mouth daily.      . NON FORMULARY Diabetic MVit Pak    . pioglitazone (ACTOS) 15 MG tablet Take 1 tablet (15 mg total) by mouth daily. 30 tablet 3   No facility-administered medications prior to visit.    PE: Blood pressure 147/93, pulse 113, temperature 98.6 F (37 C), temperature source Oral, resp. rate 16, height 6' (1.829 m), weight 218 lb 8 oz (99.111 kg), SpO2 99 %. VS: noted--normal. Gen: alert, NAD, NONTOXIC APPEARING. HEENT: eyes without injection, drainage, or swelling.  Ears: EACs clear, TMs with normal light reflex and landmarks.  Nose: Clear rhinorrhea, with  some dried, crusty exudate adherent to mildly injected mucosa.  No purulent d/c.  No paranasal sinus TTP.  No facial swelling.  Throat and mouth without focal lesion.  No pharyngial swelling, erythema, or exudate.   Neck: supple, no LAD.   LUNGS: CTA bilat, nonlabored resps.  Some post-exhalation coughing is noted. CV: RRR, no m/r/g. EXT: no c/c/e SKIN: no rash   IMPRESSION AND PLAN:  URI--prolonged, with cough.  Suspect acute bronchitis component. Azithromycin x 5d. Prednisone  qd x 5d. Therapeutic expectations and side effect profile of medication discussed today.  Patient's questions answered.  Continue symptomatic care.  An After Visit Summary was printed and given to the patient.  FOLLOW UP: prn

## 2015-02-21 ENCOUNTER — Other Ambulatory Visit: Payer: Self-pay | Admitting: Family Medicine

## 2015-02-21 NOTE — Telephone Encounter (Signed)
RF request for metformin LOV: 12/03/14 Next ov: None Last written: 11/20/14 #120 w/ 2RF

## 2015-04-22 ENCOUNTER — Other Ambulatory Visit: Payer: Self-pay | Admitting: Family Medicine

## 2015-04-22 NOTE — Telephone Encounter (Signed)
RF request for metformin LOV: 12/03/14 Next ov: None Last written: 02/21/15 #120 w/ 1RF

## 2015-05-04 ENCOUNTER — Other Ambulatory Visit: Payer: Self-pay | Admitting: Family Medicine

## 2015-05-06 NOTE — Telephone Encounter (Signed)
RF request for atorvastatin LOV: 12/03/14 Next ov: None Last written: 01/15/15 #90 w/ [id number]

## 2016-03-12 ENCOUNTER — Other Ambulatory Visit: Payer: Self-pay | Admitting: Family Medicine

## 2016-03-12 NOTE — Telephone Encounter (Signed)
[facility] [place].  RF request for atorvastatin LOV: 12/03/14 Next ov: None Last written: 05/06/15 #90 w/ 1RF  Will send Rx for #90 w/ 0Rf. Pt needs f/u [facility], for more refills. Pt advised and voiced understanding.  Apt made for 04/10/16 at 10:30am.

## 2016-03-17 ENCOUNTER — Telehealth: Payer: Self-pay | Admitting: Family Medicine

## 2016-03-17 NOTE — Telephone Encounter (Signed)
Patient walked in with a dog bite & asked for a tetanus booster & antibiotic. When patient was advised that animal control would be contacted he declined appointment. Can you confirm that he is up to date on his tetanus?

## 2016-03-18 NOTE — Telephone Encounter (Signed)
SW pt and advised him that per his report he has a Tetanus on 06/26/2007. He stated that the dog is up to date on its vaccines. He is not having any other symptoms right now. He stated that he will keep an eye on it and if anything changes he will come in to be seen.

## 2016-04-10 ENCOUNTER — Ambulatory Visit (INDEPENDENT_AMBULATORY_CARE_PROVIDER_SITE_OTHER): Payer: BC Managed Care – PPO | Admitting: Family Medicine

## 2016-04-10 ENCOUNTER — Encounter: Payer: Self-pay | Admitting: Family Medicine

## 2016-04-10 VITALS — BP 162/88 | HR 84 | Temp 98.4°F | Resp 16 | Ht 72.0 in | Wt 220.8 lb

## 2016-04-10 DIAGNOSIS — Z1211 Encounter for screening for malignant neoplasm of colon: Secondary | ICD-10-CM

## 2016-04-10 DIAGNOSIS — E1149 Type 2 diabetes mellitus with other diabetic neurological complication: Secondary | ICD-10-CM | POA: Diagnosis not present

## 2016-04-10 DIAGNOSIS — E7800 Pure hypercholesterolemia, unspecified: Secondary | ICD-10-CM | POA: Diagnosis not present

## 2016-04-10 LAB — COMPREHENSIVE METABOLIC PANEL
ALK PHOS: 71 U/L (ref 39–117)
ALT: 17 U/L (ref 0–53)
AST: 15 U/L (ref 0–37)
Albumin: 4.6 g/dL (ref 3.5–5.2)
BILIRUBIN TOTAL: 0.5 mg/dL (ref 0.2–1.2)
BUN: 13 mg/dL (ref 6–23)
CO2: 31 mEq/L (ref 19–32)
Calcium: 9.7 mg/dL (ref 8.4–10.5)
Chloride: 102 mEq/L (ref 96–112)
Creatinine, Ser: 0.82 mg/dL (ref 0.40–1.50)
GFR: 101.67 mL/min (ref >60.00)
GLUCOSE: 197 mg/dL — AB (ref 70–99)
POTASSIUM: 4.7 meq/L (ref 3.5–5.1)
Sodium: 139 mEq/L (ref 135–145)
TOTAL PROTEIN: 7 g/dL (ref 6.0–8.3)

## 2016-04-10 LAB — LIPID PANEL
CHOLESTEROL: 137 mg/dL (ref 0–200)
HDL: 53.5 mg/dL (ref >39.00)
LDL Cholesterol: 71 mg/dL (ref 0–99)
NONHDL: 83.9
Total CHOL/HDL Ratio: 3
Triglycerides: 65 mg/dL (ref 0.0–149.0)
VLDL: 13 mg/dL (ref 0.0–40.0)

## 2016-04-10 LAB — MICROALBUMIN / CREATININE URINE RATIO
Creatinine,U: 125.2 mg/dL
MICROALB UR: 7.6 mg/dL — AB (ref 0.0–1.9)
Microalb Creat Ratio: 6.1 mg/g (ref 0.0–30.0)

## 2016-04-10 LAB — HEMOGLOBIN A1C: Hgb A1c MFr Bld: 9.6 % — ABNORMAL HIGH (ref 4.6–6.5)

## 2016-04-10 MED ORDER — ATORVASTATIN CALCIUM 40 MG PO TABS: 40.0000 mg | ORAL_TABLET | Freq: Every day | ORAL | 3 refills | Status: DC

## 2016-04-10 MED ORDER — METFORMIN HCL 500 MG PO TABS: ORAL_TABLET | ORAL | 6 refills | Status: DC

## 2016-04-10 NOTE — Progress Notes (Signed)
OFFICE VISIT  04/10/2016   CC:  Chief Complaint  Patient presents with  . Follow-up    [facility], pt is fasting.    HPI:    Patient is a 61 y.o. Caucasian male who presents for f/u DM 2, hyperlipidemia, hx of white coat HTN. I last saw him 11/2014--he has been lost to follow up.  Says he has felt really good.  DM: occ glucose monitoring 110-130, fasting.  Taking metformin only, stopped pioglitazone. Says he is trying eat smaller portion sizes.  Denies any persistent neuropathic symptoms in feet or vision complaints.  HLD: taking atorv  qd, denies any side effects.  BP: checks outside of the office always 130s/80s.  Past Medical History:  Diagnosis Date  . Colon cancer screening    03/2014 iFOB negative.  . Diabetes mellitus with complication (HCC) 09/12/10   Has mild DPN.  HbA1c 12.6% at the time of dx.  . Elevated blood pressure reading without diagnosis of hypertension 12/26/2010   White coat HTN  . 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  . Hyperlipidemia   . Increased prostate specific antigen (PSA) velocity 2016   ? due to cycling?  Pt chose repeat PSA instead of Biopsy and it showed lower PSA value when he had not been cycling prior to PSA check.  . Toe ulcer due to DM [facility]) fall 2012   Right great toe    Past Surgical History:  Procedure Laterality Date  . ROOT CANAL    . TONSILLECTOMY      Outpatient Medications Prior to Visit  Medication Sig Dispense Refill  . EPINEPHrine 0.3 mg/0.3 mL IJ SOAJ injection Inject 0.3 mLs (0.3 mg total) into the muscle once. 2 Device 1  . glucose blood (FREESTYLE LITE) test strip Use to check blood sugar twice daily as directed 100 each 5  . Krill Oil 500 MG CAPS Take 1 capsule by mouth daily.    Demetra Shiner Devices (B-D LANCET DEVICE) MISC Use as directed to check blood sugars 2 times per day 60 each 5  . Multiple Vitamins-Minerals (CENTRUM PO) Take 1 tablet by mouth  daily.      Marland Kitchen atorvastatin (LIPITOR) 40 MG tablet TAKE ONE TABLET BY MOUTH ONCE DAILY 90 tablet 0  . metFORMIN (GLUCOPHAGE) 500 MG tablet TAKE TWO TABLETS BY MOUTH TWICE DAILY WITH MEALS 120 tablet 6  . azithromycin (ZITHROMAX) 250 MG tablet 2 tabs po qd x 1 day, then 1 tab po qd x 4d (Patient not taking: Reported on 04/10/2016) 6 tablet 0  . NON FORMULARY Diabetic MVit Pak    . pioglitazone (ACTOS) 15 MG tablet Take 1 tablet (15 mg total) by mouth daily. (Patient not taking: Reported on 04/10/2016) 30 tablet 3  . predniSONE (DELTASONE) 20 MG tablet 2 tabs po qd x 5d (Patient not taking: Reported on 04/10/2016) 10 tablet 0   No facility-administered medications prior to visit.     No Known Allergies  ROS As per HPI  PE: Blood pressure (!) 162/88, pulse 84, temperature 98.4 F (36.9 C), temperature source Oral, resp. rate 16, height 6' (1.829 m), weight 220 lb 12 oz (100.1 kg), SpO2 97 %. Body mass index is 29.94 kg/m.  BP recheck manually today 162/88.  Gen: Alert, well appearing.  Patient is oriented to person, place, time, and situation. AFFECT: pleasant, lucid thought and speech. EXT: no clubbing, cyanosis, or edema.  Foot  exam -  no swelling, tenderness or skin or vascular lesions. Color and temperature is normal. Sensation is intact except for plantar surface of L great toe.  There is also a fairly large callus on plantar surface of R toe that patient shaves down periodically.  Mild diminished sensation on plantar surface of R foot diffusely.  Peripheral pulses are palpable. Toenails are normal.   LABS:  Lab Results  Component Value Date   TSH 1.03 06/18/2014   Lab Results  Component Value Date   WBC 8.1 03/20/2013   HGB 13.9 03/20/2013   HCT 42.4 03/20/2013   MCV 86.0 03/20/2013   PLT 220.0 03/20/2013   Lab Results  Component Value Date   CREATININE 0.76 12/03/2014   BUN 16 12/03/2014   NA 139 12/03/2014   K 4.2 12/03/2014   CL 101 12/03/2014   CO2 30 12/03/2014   Lab  Results  Component Value Date   ALT 25 06/18/2014   AST 21 06/18/2014   ALKPHOS 70 06/18/2014   BILITOT 0.5 06/18/2014   Lab Results  Component Value Date   CHOL 122 12/03/2014   Lab Results  Component Value Date   HDL 51.80 12/03/2014   Lab Results  Component Value Date   LDLCALC 57 12/03/2014   Lab Results  Component Value Date   TRIG 64.0 12/03/2014   Lab Results  Component Value Date   CHOLHDL 2 12/03/2014   Lab Results  Component Value Date   PSA 2.72 08/27/2014   PSA 3.63 06/18/2014   PSA 2.37 03/20/2013   Lab Results  Component Value Date   HGBA1C 8.3 (H) 12/03/2014   IMPRESSION AND PLAN:  1) DM 2: lost to f/u, not compliant with monitoring. Noncompliant with pioglitazone. Check HbA1c today.   Urine microalbumin today. Feet exam done today. Pt reminded of need for updated diabetic retinopathy screening exam.   2) Hyperlipidemia: tolerating statin.  FLP today. CMET as well.  3) Colon cancer screening: iFOB is what patient prefers, so I ordered this today.  An After Visit Summary was printed and given to the patient.  FOLLOW UP: Return in about 4 months (around 08/10/2016) for annual CPE (fasting).  Signed:  Santiago Bumpers, MD           04/10/2016

## 2016-04-10 NOTE — Progress Notes (Signed)
Pre visit review using our clinic review tool, if applicable. No additional management support is needed unless otherwise documented below in the visit note. 

## 2016-04-13 ENCOUNTER — Other Ambulatory Visit: Payer: Self-pay | Admitting: Family Medicine

## 2016-04-13 MED ORDER — CANAGLIFLOZIN 100 MG PO TABS: 100.0000 mg | ORAL_TABLET | Freq: Every day | ORAL | 4 refills | Status: DC

## 2016-04-16 ENCOUNTER — Telehealth: Payer: Self-pay | Admitting: *Deleted

## 2016-04-16 NOTE — Telephone Encounter (Signed)
Yes, starting pioglitazone instead of invokana is fine.-thx

## 2016-04-16 NOTE — Telephone Encounter (Signed)
Pt called and stated that the [name] is going to cost him $300-$400 dollars a month. He stated that he still has Rx for pioglitazone, could he start that instead? Please advise. Thanks.

## 2016-04-17 MED ORDER — PIOGLITAZONE HCL 15 MG PO TABS: 15.0000 mg | ORAL_TABLET | Freq: Every day | ORAL | 0 refills | Status: DC

## 2016-04-17 NOTE — Telephone Encounter (Signed)
Pt advised and voiced understanding.   

## 2016-05-18 ENCOUNTER — Other Ambulatory Visit: Payer: Self-pay | Admitting: Family Medicine

## 2016-08-10 ENCOUNTER — Ambulatory Visit (INDEPENDENT_AMBULATORY_CARE_PROVIDER_SITE_OTHER): Payer: BC Managed Care – PPO | Admitting: Family Medicine

## 2016-08-10 ENCOUNTER — Encounter: Payer: Self-pay | Admitting: Family Medicine

## 2016-08-10 VITALS — BP 160/100 | HR 80 | Temp 98.6°F | Resp 16 | Ht 71.5 in | Wt 224.5 lb

## 2016-08-10 DIAGNOSIS — Z0000 Encounter for general adult medical examination without abnormal findings: Secondary | ICD-10-CM

## 2016-08-10 DIAGNOSIS — Z125 Encounter for screening for malignant neoplasm of prostate: Secondary | ICD-10-CM

## 2016-08-10 DIAGNOSIS — Z1211 Encounter for screening for malignant neoplasm of colon: Secondary | ICD-10-CM

## 2016-08-10 DIAGNOSIS — E118 Type 2 diabetes mellitus with unspecified complications: Secondary | ICD-10-CM | POA: Diagnosis not present

## 2016-08-10 NOTE — Patient Instructions (Signed)

## 2016-08-10 NOTE — Progress Notes (Signed)
OFFICE VISIT  08/10/2016   CC:  Chief Complaint  Patient presents with  . Annual Exam    Pt is fasting.     HPI:    Patient is a 61 y.o. Caucasian male who presents for health maintenance exam. Feeling well.  Eyes: last eye exam > 1 yr ago. Exercise: rides bike, lifts weights some--he can't be clear on how often. Working on diet soon--plans on getting help of dietitian. Very active with work, as usual.  Hx of elevated prostte issues, got checked with DRE multiple times and he declines any DRE today.   Past Medical History:  Diagnosis Date  . Colon cancer screening    03/2014 iFOB negative.  . Diabetes mellitus with complication (HCC) 09/12/10   Has mild DPN.  HbA1c 12.6% at the time of dx.  . Elevated blood pressure reading without diagnosis of hypertension 12/26/2010   White coat HTN  . 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  . Hyperlipidemia   . Increased prostate specific antigen (PSA) velocity 2016   ? due to cycling?  Pt chose repeat PSA instead of Biopsy and it showed lower PSA value when he had not been cycling prior to PSA check.  . Toe ulcer due to DM [facility]) fall 2012   Right great toe    Past Surgical History:  Procedure Laterality Date  . ROOT CANAL    . TONSILLECTOMY     Social History   Social History  . Marital status: Married    Spouse name: N/A  . Number of children: N/A  . Years of education: N/A   Social History Main Topics  . Smoking status: Never Smoker  . Smokeless tobacco: Former Neurosurgeon    Types: Snuff    Quit date: 01/06/2000     Comment: [place]  . Alcohol use No  . Drug use: No  . Sexual activity: Yes    Partners: Female   Other Topics Concern  . None   Social History Narrative   Married, has 2 grown sons.     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  Also works in his [employer].   No formal exercise regimen but very active--NOT SEDENTARY.   No T/A/Ds.   Family History  Problem Relation Age of Onset  . Diabetes Maternal Grandmother        type 2  . Cancer Maternal Grandfather        all over  . Arthritis Paternal Grandmother   . Heart attack Paternal Grandfather        smoke  . Alcohol abuse Paternal Grandfather        smoker and drinker  . Hodgkin's lymphoma Cousin   . Diabetes Father      Outpatient Medications Prior to Visit  Medication Sig Dispense Refill  . atorvastatin (LIPITOR) 40 MG tablet Take 1 tablet (40 mg total) by mouth daily. 90 tablet 3  . Cyanocobalamin (VITAMIN B 12 PO) Take by mouth.    . EPINEPHrine 0.3 mg/0.3 mL IJ SOAJ injection Inject 0.3 mLs (0.3 mg total) into the muscle once. 2 Device 1  . glucose blood (FREESTYLE LITE) test strip Use to check blood sugar twice daily as directed 100 each 5  . Krill Oil 500 MG CAPS Take 1 capsule by mouth daily.    Demetra Shiner Devices (B-D LANCET DEVICE) MISC Use as directed to check blood sugars  2 times per day 60 each 5  . metFORMIN (GLUCOPHAGE) 500 MG tablet TAKE TWO TABLETS BY MOUTH TWICE DAILY WITH MEALS 120 tablet 6  . Multiple Vitamins-Minerals (CENTRUM PO) Take 1 tablet by mouth daily.      . pioglitazone (ACTOS) 15 MG tablet TAKE ONE TABLET BY MOUTH ONCE DAILY 30 tablet 3  . Probiotic Product (PROBIOTIC-10 PO) Take 1 capsule by mouth daily.    . pioglitazone (ACTOS) 15 MG tablet Take 1 tablet (15 mg total) by mouth daily. (Patient not taking: Reported on 08/10/2016) 1 tablet 0   No facility-administered medications prior to visit.     No Known Allergies  ROS See below  PE: Blood pressure (!) 160/100, pulse 80, temperature 98.6 F (37 C), temperature source Oral, resp. rate 16, height 5' 11.5" (1.816 m), weight 224 lb 8 oz (101.8 kg), SpO2 99 %. Review of Systems  Constitutional: Negative for appetite change, chills, fatigue and fever.  HENT: Negative for congestion, dental problem, ear pain and sore throat.   Eyes: Negative for discharge, redness  and visual disturbance.  Respiratory: Negative for cough, chest tightness, shortness of breath and wheezing.   Cardiovascular: Negative for chest pain, palpitations and leg swelling.  Gastrointestinal: Negative for abdominal pain, blood in stool, diarrhea, nausea and vomiting.  Genitourinary: Negative for difficulty urinating, dysuria, flank pain, frequency, hematuria and urgency.  Musculoskeletal: Negative for arthralgias, back pain, joint swelling, myalgias and neck stiffness.  Skin: Negative for pallor and rash.  Neurological: Negative for dizziness, speech difficulty, weakness and headaches.  Hematological: Negative for adenopathy. Does not bruise/bleed easily.  Psychiatric/Behavioral: Negative for confusion and sleep disturbance. The patient is not nervous/anxious.    Gen: Alert, well appearing.  Patient is oriented to person, place, time, and situation. AFFECT: pleasant, lucid thought and speech. ENT: Ears: EACs clear, normal epithelium.  TMs with good light reflex and landmarks bilaterally.  Eyes: no injection, icteris, swelling, or exudate.  EOMI, PERRLA. Nose: no drainage or turbinate edema/swelling.  No injection or focal lesion.  Mouth: lips without lesion/swelling.  Oral mucosa pink and moist.  Dentition intact and without obvious caries or gingival swelling.  Oropharynx without erythema, exudate, or swelling.  Neck: supple/nontender.  No LAD, mass, or TM.  Carotid pulses 2+ bilaterally, without bruits. CV: RRR, no m/r/g.   LUNGS: CTA bilat, nonlabored resps, good aeration in all lung fields. ABD: soft, NT, ND, BS normal.  No hepatospenomegaly or mass.  No bruits. EXT: no clubbing, cyanosis, or edema.  Musculoskeletal: no joint swelling, erythema, warmth, or tenderness.  ROM of all joints intact. Skin - no sores or suspicious lesions or rashes or color changes    LABS:  Lab Results  Component Value Date   TSH 1.03 06/18/2014   Lab Results  Component Value Date   WBC 8.1  03/20/2013   HGB 13.9 03/20/2013   HCT 42.4 03/20/2013   MCV 86.0 03/20/2013   PLT 220.0 03/20/2013   Lab Results  Component Value Date   CREATININE 0.82 04/10/2016   BUN 13 04/10/2016   NA 139 04/10/2016   K 4.7 04/10/2016   CL 102 04/10/2016   CO2 31 04/10/2016   Lab Results  Component Value Date   ALT 17 04/10/2016   AST 15 04/10/2016   ALKPHOS 71 04/10/2016   BILITOT 0.5 04/10/2016   Lab Results  Component Value Date   CHOL 137 04/10/2016   Lab Results  Component Value Date   HDL 53.50 04/10/2016  Lab Results  Component Value Date   LDLCALC 71 04/10/2016   Lab Results  Component Value Date   TRIG 65.0 04/10/2016   Lab Results  Component Value Date   CHOLHDL 3 04/10/2016   Lab Results  Component Value Date   PSA 2.72 08/27/2014   PSA 3.63 06/18/2014   PSA 2.37 03/20/2013   Lab Results  Component Value Date   HGBA1C 9.6 (H) 04/10/2016   IMPRESSION AND PLAN:  Health maintenance exam:  Reviewed age and gender appropriate health maintenance issues (prudent diet, regular exercise, health risks of tobacco and excessive alcohol, use of seatbelts, fire alarms in home, use of sunscreen).  Also reviewed age and gender appropriate health screening as well as vaccine recommendations. Vaccines: UTD. Labs: CBC, CMET, TSH (screening), and A1c today (DM 2). Prostate ca screening: he declined the DRE today.  PSA drawn. Colon cancer screening: he declines colonoscopy today.   iFOB given to pt today.  An After Visit Summary was printed and given to the patient.  Due for CPE for next f/u visit.  An After Visit Summary was printed and given to the patient.  FOLLOW UP: Return in about 4 months (around 12/10/2016) for routine chronic illness f/u.  Signed:  Santiago Bumpers, MD           08/10/2016

## 2016-08-11 ENCOUNTER — Other Ambulatory Visit: Payer: Self-pay | Admitting: *Deleted

## 2016-08-11 LAB — CBC WITH DIFFERENTIAL/PLATELET
BASOS PCT: 1 %
Basophils Absolute: 70 cells/uL (ref 0–200)
Eosinophils Absolute: 140 cells/uL (ref 15–500)
Eosinophils Relative: 2 %
HEMATOCRIT: 40.9 % (ref 38.5–50.0)
HEMOGLOBIN: 13.4 g/dL (ref 13.2–17.1)
LYMPHS ABS: 1540 {cells}/uL (ref 850–3900)
Lymphocytes Relative: 22 %
MCH: 28.9 pg (ref 27.0–33.0)
MCHC: 32.8 g/dL (ref 32.0–36.0)
MCV: 88.3 fL (ref 80.0–100.0)
MONO ABS: 840 {cells}/uL (ref 200–950)
MPV: 9.8 fL (ref 7.5–12.5)
Monocytes Relative: 12 %
NEUTROS PCT: 63 %
Neutro Abs: 4410 cells/uL (ref 1500–7800)
Platelets: 258 10*3/uL (ref 140–400)
RBC: 4.63 MIL/uL (ref 4.20–5.80)
RDW: 14.3 % (ref 11.0–15.0)
WBC: 7 10*3/uL (ref 3.8–10.8)

## 2016-08-11 LAB — COMPREHENSIVE METABOLIC PANEL
ALBUMIN: 4.4 g/dL (ref 3.6–5.1)
ALK PHOS: 53 U/L (ref 40–115)
ALT: 18 U/L (ref 9–46)
AST: 18 U/L (ref 10–35)
BILIRUBIN TOTAL: 0.7 mg/dL (ref 0.2–1.2)
BUN: 17 mg/dL (ref 7–25)
CO2: 27 mmol/L (ref 20–32)
CREATININE: 0.76 mg/dL (ref 0.70–1.25)
Calcium: 9.3 mg/dL (ref 8.6–10.3)
Chloride: 102 mmol/L (ref 98–110)
Glucose, Bld: 190 mg/dL — ABNORMAL HIGH (ref 65–99)
Potassium: 4.4 mmol/L (ref 3.5–5.3)
Sodium: 140 mmol/L (ref 135–146)
TOTAL PROTEIN: 6.7 g/dL (ref 6.1–8.1)

## 2016-08-11 LAB — HEMOGLOBIN A1C
Hgb A1c MFr Bld: 8.5 % — ABNORMAL HIGH (ref <5.7)
Mean Plasma Glucose: 197 mg/dL

## 2016-08-11 LAB — PSA: PSA: 3 ng/mL (ref <=4.0)

## 2016-08-11 LAB — TSH: TSH: 1.15 mIU/L (ref 0.40–4.50)

## 2016-08-11 MED ORDER — PIOGLITAZONE HCL 15 MG PO TABS: 15.0000 mg | ORAL_TABLET | Freq: Every day | ORAL | 1 refills | Status: DC

## 2016-12-08 ENCOUNTER — Other Ambulatory Visit: Payer: Self-pay | Admitting: *Deleted

## 2016-12-08 ENCOUNTER — Ambulatory Visit: Payer: BC Managed Care – PPO | Admitting: Family Medicine

## 2016-12-08 DIAGNOSIS — I10 Essential (primary) hypertension: Secondary | ICD-10-CM

## 2016-12-08 DIAGNOSIS — E118 Type 2 diabetes mellitus with unspecified complications: Secondary | ICD-10-CM

## 2016-12-08 NOTE — Progress Notes (Deleted)
OFFICE VISIT  12/08/2016   CC: No chief complaint on file.    HPI:    Patient is a 61 y.o. Caucasian male who presents for f/u DM 2 with DPN, hypercholesterolemia, hx of elevated bp w/out dx of HTN--presumed white coat HTN. Last visit his A1c was 8.6%, and I added pioglitazone 15mg  qd to his med regimen.  Past Medical History:  Diagnosis Date  . Colon cancer screening    03/2014 iFOB negative.  . Diabetes mellitus with complication (HCC) 09/12/10   Has mild DPN.  HbA1c 12.6% at the time of dx.  . Elevated blood pressure reading without diagnosis of hypertension 12/26/2010   White coat HTN  . 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  . Hyperlipidemia   . Increased prostate specific antigen (PSA) velocity 2016   ? due to cycling?  Pt chose repeat PSA instead of Biopsy and it showed lower PSA value when he had not been cycling prior to PSA check.  . Toe ulcer due to DM [facility](HCC) fall 2012   Right great toe    Past Surgical History:  Procedure Laterality Date  . ROOT CANAL    . TONSILLECTOMY      Outpatient Medications Prior to Visit  Medication Sig Dispense Refill  . atorvastatin (LIPITOR) 40 MG tablet Take 1 tablet (40 mg total) by mouth daily. 90 tablet 3  . Cyanocobalamin (VITAMIN B 12 PO) Take by mouth.    . EPINEPHrine 0.3 mg/0.3 mL IJ SOAJ injection Inject 0.3 mLs (0.3 mg total) into the muscle once. 2 Device 1  . glucose blood (FREESTYLE LITE) test strip Use to check blood sugar twice daily as directed 100 each 5  . Krill Oil 500 MG CAPS Take 1 capsule by mouth daily.    Demetra Shiner. Lancet Devices (B-D LANCET DEVICE) MISC Use as directed to check blood sugars 2 times per day 60 each 5  . metFORMIN (GLUCOPHAGE) 500 MG tablet TAKE TWO TABLETS BY MOUTH TWICE DAILY WITH MEALS 120 tablet 6  . Multiple Vitamins-Minerals (CENTRUM PO) Take 1 tablet by mouth daily.      . pioglitazone (ACTOS) 15 MG tablet Take 1 tablet (15 mg  total) by mouth daily. 90 tablet 1  . Probiotic Product (PROBIOTIC-10 PO) Take 1 capsule by mouth daily.     No facility-administered medications prior to visit.     No Known Allergies  ROS As per HPI  PE: There were no vitals taken for this visit. ***  LABS:  Lab Results  Component Value Date   TSH 1.15 08/10/2016   Lab Results  Component Value Date   WBC 7.0 08/10/2016   HGB 13.4 08/10/2016   HCT 40.9 08/10/2016   MCV 88.3 08/10/2016   PLT 258 08/10/2016   Lab Results  Component Value Date   CREATININE 0.76 08/10/2016   BUN 17 08/10/2016   NA 140 08/10/2016   K 4.4 08/10/2016   CL 102 08/10/2016   CO2 27 08/10/2016   Lab Results  Component Value Date   ALT 18 08/10/2016   AST 18 08/10/2016   ALKPHOS 53 08/10/2016   BILITOT 0.7 08/10/2016   Lab Results  Component Value Date   CHOL 137 04/10/2016   Lab Results  Component Value Date   HDL 53.50 04/10/2016   Lab Results  Component Value Date   LDLCALC 71 04/10/2016   Lab Results  Component Value  Date   TRIG 65.0 04/10/2016   Lab Results  Component Value Date   CHOLHDL 3 04/10/2016   Lab Results  Component Value Date   PSA 3.0 08/10/2016   PSA 2.72 08/27/2014   PSA 3.63 06/18/2014   Lab Results  Component Value Date   HGBA1C 8.5 (H) 08/10/2016    IMPRESSION AND PLAN:  No problem-specific Assessment & Plan notes found for this encounter. Needs flu and pneumovax 23 vaccines.  An After Visit Summary was printed and given to the patient.  FOLLOW UP: No Follow-up on file.  Signed:  Santiago BumpersPhil Keanu Frickey, MD           12/08/2016

## 2016-12-18 ENCOUNTER — Other Ambulatory Visit: Payer: Self-pay | Admitting: Family Medicine

## 2017-03-19 ENCOUNTER — Other Ambulatory Visit: Payer: Self-pay | Admitting: Family Medicine

## 2017-03-22 NOTE — Telephone Encounter (Signed)
Tried calling pt, NA and unable to leave a message because vm box has not been set up yet.  Okay for PEC to advise pt if pt calls back.

## 2017-03-22 NOTE — Telephone Encounter (Signed)
Pt over due for f/u RCI. Needs office visit for follow up with Dr. Milinda CaveMcGowen. Will send Rx for #90 w/ 0RF.

## 2017-03-22 NOTE — Telephone Encounter (Signed)
Apt made for 03/29/17 at 8:45am.

## 2017-03-29 ENCOUNTER — Ambulatory Visit: Payer: BC Managed Care – PPO | Admitting: Family Medicine

## 2017-03-29 NOTE — Progress Notes (Deleted)
OFFICE VISIT  03/29/2017   CC: No chief complaint on file.    HPI:    Patient is a 62 y.o. Caucasian male who presents for 7 mo f/u DM 2, HTN, HLD.  Past Medical History:  Diagnosis Date  . Colon cancer screening    03/2014 iFOB negative.  . Diabetes mellitus with complication (HCC) 09/12/10   Has mild DPN.  HbA1c 12.6% at the time of dx.  . Elevated blood pressure reading without diagnosis of hypertension 12/26/2010   White coat HTN  . 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  . Hyperlipidemia   . Increased prostate specific antigen (PSA) velocity 2016   ? due to cycling?  Pt chose repeat PSA instead of Biopsy and it showed lower PSA value when he had not been cycling prior to PSA check.  . Toe ulcer due to DM [facility](HCC) fall 2012   Right great toe    Past Surgical History:  Procedure Laterality Date  . ROOT CANAL    . TONSILLECTOMY      Outpatient Medications Prior to Visit  Medication Sig Dispense Refill  . atorvastatin (LIPITOR) 40 MG tablet Take 1 tablet (40 mg total) by mouth daily. 90 tablet 3  . Cyanocobalamin (VITAMIN B 12 PO) Take by mouth.    . EPINEPHrine 0.3 mg/0.3 mL IJ SOAJ injection Inject 0.3 mLs (0.3 mg total) into the muscle once. 2 Device 1  . glucose blood (FREESTYLE LITE) test strip Use to check blood sugar twice daily as directed 100 each 5  . Krill Oil 500 MG CAPS Take 1 capsule by mouth daily.    Demetra Shiner. Lancet Devices (B-D LANCET DEVICE) MISC Use as directed to check blood sugars 2 times per day 60 each 5  . metFORMIN (GLUCOPHAGE) 500 MG tablet TAKE 2 TABLETS BY MOUTH TWICE DAILY WITH MEALS 120 tablet 5  . Multiple Vitamins-Minerals (CENTRUM PO) Take 1 tablet by mouth daily.      . pioglitazone (ACTOS) 15 MG tablet TAKE 1 TABLET BY MOUTH ONCE DAILY 90 tablet 0  . Probiotic Product (PROBIOTIC-10 PO) Take 1 capsule by mouth daily.     No facility-administered medications prior to visit.     No  Known Allergies  ROS As per HPI  PE: There were no vitals taken for this visit. ***  LABS:  Lab Results  Component Value Date   TSH 1.15 08/10/2016   Lab Results  Component Value Date   WBC 7.0 08/10/2016   HGB 13.4 08/10/2016   HCT 40.9 08/10/2016   MCV 88.3 08/10/2016   PLT 258 08/10/2016   Lab Results  Component Value Date   CREATININE 0.76 08/10/2016   BUN 17 08/10/2016   NA 140 08/10/2016   K 4.4 08/10/2016   CL 102 08/10/2016   CO2 27 08/10/2016   Lab Results  Component Value Date   ALT 18 08/10/2016   AST 18 08/10/2016   ALKPHOS 53 08/10/2016   BILITOT 0.7 08/10/2016   Lab Results  Component Value Date   CHOL 137 04/10/2016   Lab Results  Component Value Date   HDL 53.50 04/10/2016   Lab Results  Component Value Date   LDLCALC 71 04/10/2016   Lab Results  Component Value Date   TRIG 65.0 04/10/2016   Lab Results  Component Value Date   CHOLHDL 3 04/10/2016   Lab Results  Component Value Date  PSA 3.0 08/10/2016   PSA 2.72 08/27/2014   PSA 3.63 06/18/2014   Lab Results  Component Value Date   HGBA1C 8.5 (H) 08/10/2016   IMPRESSION AND PLAN:  No problem-specific Assessment & Plan notes found for this encounter.   Pneumovax 23.  An After Visit Summary was printed and given to the patient.  FOLLOW UP: No follow-ups on file.3 mo RCI  Signed:  Santiago Bumpers, MD           03/29/2017

## 2017-03-31 ENCOUNTER — Encounter: Payer: Self-pay | Admitting: Family Medicine

## 2017-03-31 ENCOUNTER — Ambulatory Visit: Payer: BC Managed Care – PPO | Admitting: Family Medicine

## 2017-03-31 VITALS — BP 156/82 | HR 87 | Temp 98.3°F | Resp 16 | Ht 71.5 in | Wt 227.1 lb

## 2017-03-31 DIAGNOSIS — E118 Type 2 diabetes mellitus with unspecified complications: Secondary | ICD-10-CM

## 2017-03-31 DIAGNOSIS — I10 Essential (primary) hypertension: Secondary | ICD-10-CM | POA: Diagnosis not present

## 2017-03-31 DIAGNOSIS — E7800 Pure hypercholesterolemia, unspecified: Secondary | ICD-10-CM

## 2017-03-31 DIAGNOSIS — B9789 Other viral agents as the cause of diseases classified elsewhere: Secondary | ICD-10-CM

## 2017-03-31 DIAGNOSIS — J069 Acute upper respiratory infection, unspecified: Secondary | ICD-10-CM

## 2017-03-31 LAB — BASIC METABOLIC PANEL
BUN: 15 mg/dL (ref 6–23)
CHLORIDE: 100 meq/L (ref 96–112)
CO2: 30 mEq/L (ref 19–32)
CREATININE: 0.73 mg/dL (ref 0.40–1.50)
Calcium: 9.8 mg/dL (ref 8.4–10.5)
GFR: 115.89 mL/min (ref >60.00)
GLUCOSE: 236 mg/dL — AB (ref 70–99)
Potassium: 4.9 mEq/L (ref 3.5–5.1)
Sodium: 139 mEq/L (ref 135–145)

## 2017-03-31 LAB — LIPID PANEL
CHOLESTEROL: 125 mg/dL (ref 0–200)
HDL: 55.9 mg/dL (ref >39.00)
LDL Cholesterol: 57 mg/dL (ref 0–99)
NonHDL: 68.92
TRIGLYCERIDES: 62 mg/dL (ref 0.0–149.0)
Total CHOL/HDL Ratio: 2
VLDL: 12.4 mg/dL (ref 0.0–40.0)

## 2017-03-31 LAB — HEMOGLOBIN A1C: HEMOGLOBIN A1C: 9.3 % — AB (ref 4.6–6.5)

## 2017-03-31 NOTE — Progress Notes (Signed)
OFFICE VISIT  04/08/2017   CC:  Chief Complaint  Patient presents with  . Follow-up    RCI, pt is fasting.     HPI:    Patient is a 62 y.o. Caucasian male who presents for f/u DM 2, HLD, elev bp w/out dx of HTN (white coat HTN). He is not very compliant with following up at appropriate intervals.  DM 2: checks a fasting gluc 1 time a month: 120-150.  He occ feels polyuria and blurry vision and neuropathy gets worse when his glucose gets worse.  He responds to this by eating better and this is helpful for resolving these sx's. Has gained 8 lbs over the winter. Not as active during this time.  Says diet not changes. Has plans for eye exam soon. 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  Taking CBD oil and it helps his feet neuropathy.  URI for last couple weeks.  Nasal mucous, cough that is rattly/breaking up.  NO ST.  No wheezing or SOB.  White coat HTN: admits he's nervous about coming to MD. Hiram Gashcc goes to [facility]: most recent check 138/90s. Took dayquil today.  HLD: compliant with atorvastatin.  No side effects.  He had a week or so of myalgias/arthralgias since I last saw him, but this abated with stopping atorva a few days.  When he restarted atorva he has had no similar sx's since.  NO HAs, no palpitations, no CP, no SOB, no melena or hematochezia.  No myalgias or arthralgias.  Past Medical History:  Diagnosis Date  . Colon cancer screening    03/2014 iFOB negative.  . Diabetes mellitus with complication (HCC) 09/12/10   Has mild DPN.  HbA1c 12.6% at the time of dx.  . Elevated blood pressure reading without diagnosis of hypertension 12/26/2010   White coat HTN  . 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  . Hyperlipidemia   . Increased prostate specific antigen (PSA) velocity 2016   ? due to cycling?  Pt chose repeat PSA instead of Biopsy and it showed lower PSA value when he had not been  cycling prior to PSA check.  . Toe ulcer due to DM [facility](HCC) fall 2012   Right great toe    Past Surgical History:  Procedure Laterality Date  . ROOT CANAL    . TONSILLECTOMY      Outpatient Medications Prior to Visit  Medication Sig Dispense Refill  . atorvastatin (LIPITOR) 40 MG tablet Take 1 tablet (40 mg total) by mouth daily. 90 tablet 3  . Cyanocobalamin (VITAMIN B 12 PO) Take by mouth.    . EPINEPHrine 0.3 mg/0.3 mL IJ SOAJ injection Inject 0.3 mLs (0.3 mg total) into the muscle once. 2 Device 1  . glucose blood (FREESTYLE LITE) test strip Use to check blood sugar twice daily as directed 100 each 5  . Krill Oil 500 MG CAPS Take 1 capsule by mouth daily.    Demetra Shiner. Lancet Devices (B-D LANCET DEVICE) MISC Use as directed to check blood sugars 2 times per day 60 each 5  . metFORMIN (GLUCOPHAGE) 500 MG tablet TAKE 2 TABLETS BY MOUTH TWICE DAILY WITH MEALS 120 tablet 5  . Multiple Vitamins-Minerals (CENTRUM PO) Take 1 tablet by mouth daily.      . Probiotic Product (PROBIOTIC-10 PO) Take 1 capsule by mouth daily.    . pioglitazone (ACTOS) 15 MG tablet TAKE  1 TABLET BY MOUTH ONCE DAILY 90 tablet 0   No facility-administered medications prior to visit.     No Known Allergies  ROS As per HPI  PE: Blood pressure (!) 156/82, pulse 87, temperature 98.3 F (36.8 C), temperature source Oral, resp. rate 16, height 5' 11.5" (1.816 m), weight 227 lb 2 oz (103 kg), SpO2 99 %. Body mass index is 31.24 kg/m.  VS: noted--normal. Gen: alert, NAD, NONTOXIC APPEARING. HEENT: eyes without injection, drainage, or swelling.  Ears: EACs clear, TMs with normal light reflex and landmarks.  Nose: Clear rhinorrhea, with some dried, crusty exudate adherent to mildly injected mucosa.  No purulent d/c.  No paranasal sinus TTP.  No facial swelling.  Throat and mouth without focal lesion.  No pharyngial swelling, erythema, or exudate.   Neck: supple, no LAD.   LUNGS: CTA bilat, nonlabored resps.   CV: RRR, no  m/r/g. EXT: no c/c/e SKIN: no rash   LABS:  Lab Results  Component Value Date   TSH 1.15 08/10/2016   Lab Results  Component Value Date   WBC 7.0 08/10/2016   HGB 13.4 08/10/2016   HCT 40.9 08/10/2016   MCV 88.3 08/10/2016   PLT 258 08/10/2016   Lab Results  Component Value Date   CREATININE 0.73 03/31/2017   BUN 15 03/31/2017   NA 139 03/31/2017   K 4.9 03/31/2017   CL 100 03/31/2017   CO2 30 03/31/2017   Lab Results  Component Value Date   ALT 18 08/10/2016   AST 18 08/10/2016   ALKPHOS 53 08/10/2016   BILITOT 0.7 08/10/2016   Lab Results  Component Value Date   CHOL 125 03/31/2017   Lab Results  Component Value Date   HDL 55.90 03/31/2017   Lab Results  Component Value Date   LDLCALC 57 03/31/2017   Lab Results  Component Value Date   TRIG 62.0 03/31/2017   Lab Results  Component Value Date   CHOLHDL 2 03/31/2017   Lab Results  Component Value Date   PSA 3.0 08/10/2016   PSA 2.72 08/27/2014   PSA 3.63 06/18/2014   Lab Results  Component Value Date   HGBA1C 9.3 (H) 03/31/2017    IMPRESSION AND PLAN:  1) DM 2, hx of noncompliance with routine f/u and with diet. HbA1c today. He is going to schedule eye exam. Lytes/cr today.  2) HTN: The current medical regimen is pretty effective---he does have a white coat component, but also his home bp's are not quite at goal of 130/80 consistently;  He does not want to consider any med changes/increases at this time b/c he wants to try to do better with TLC. Lytes/cr today.  3) HLD: tolerating statin.   FLP today.  4) Viral URI with cough:  Get otc generic robitussin DM OR Mucinex DM and use as directed on the packaging for cough and congestion. Use otc generic saline nasal spray 2-3 times per day to irrigate/moisturize your nasal passages.  An After Visit Summary was printed and given to the patient.  FOLLOW UP: Return in about 5 months (around 08/31/2017) for annual CPE (fasting).  Signed:   Santiago Bumpers, MD           04/08/2017

## 2017-04-01 ENCOUNTER — Other Ambulatory Visit: Payer: Self-pay | Admitting: Family Medicine

## 2017-04-01 MED ORDER — LIRAGLUTIDE 18 MG/3ML ~~LOC~~ SOPN: PEN_INJECTOR | SUBCUTANEOUS | 1 refills | Status: DC

## 2017-04-01 MED ORDER — PIOGLITAZONE HCL 45 MG PO TABS: 45.0000 mg | ORAL_TABLET | Freq: Every day | ORAL | 1 refills | Status: DC

## 2017-04-01 NOTE — Progress Notes (Signed)
Opened for orders only .

## 2017-06-08 ENCOUNTER — Other Ambulatory Visit: Payer: Self-pay

## 2017-06-08 MED ORDER — INSULIN PEN NEEDLE 31G X 6 MM MISC
6 refills | Status: DC
Start: 2017-06-08 — End: 2018-03-21

## 2017-06-15 ENCOUNTER — Other Ambulatory Visit: Payer: Self-pay | Admitting: Family Medicine

## 2017-07-27 ENCOUNTER — Other Ambulatory Visit: Payer: Self-pay | Admitting: Family Medicine

## 2017-09-01 ENCOUNTER — Other Ambulatory Visit: Payer: Self-pay | Admitting: Family Medicine

## 2017-09-01 MED ORDER — LIRAGLUTIDE 18 MG/3ML ~~LOC~~ SOPN: PEN_INJECTOR | SUBCUTANEOUS | 0 refills | Status: DC

## 2017-09-01 NOTE — Telephone Encounter (Signed)
Patient is calling and states he is not out of this medication but he does not want to run into not having this medication. He states they only gave him 1 pen instead of 2. He states he would need this medication by 09/06/17.

## 2017-09-01 NOTE — Addendum Note (Signed)
Addended by: Smitty KnudsenSUTHERLAND, HEATHER K on: 09/01/2017 12:00 PM   Modules accepted: Orders

## 2017-09-01 NOTE — Telephone Encounter (Signed)
SW Ladona Ridgelaylor PEC agent who is going to advise pt that he is due for f/u w/ Dr. Milinda CaveMcGowen. Will also send in Rx for 3 pens, this should get pt by till his f/u apt.

## 2017-09-27 ENCOUNTER — Other Ambulatory Visit: Payer: Self-pay | Admitting: Family Medicine

## 2017-10-17 ENCOUNTER — Other Ambulatory Visit: Payer: Self-pay | Admitting: Family Medicine

## 2017-11-03 ENCOUNTER — Encounter: Payer: Self-pay | Admitting: Family Medicine

## 2017-11-03 ENCOUNTER — Ambulatory Visit (INDEPENDENT_AMBULATORY_CARE_PROVIDER_SITE_OTHER): Payer: BC Managed Care – PPO | Admitting: Family Medicine

## 2017-11-03 VITALS — BP 175/101 | HR 80 | Temp 98.1°F | Resp 16 | Ht 71.5 in | Wt 235.4 lb

## 2017-11-03 DIAGNOSIS — Z0000 Encounter for general adult medical examination without abnormal findings: Secondary | ICD-10-CM

## 2017-11-03 DIAGNOSIS — E7800 Pure hypercholesterolemia, unspecified: Secondary | ICD-10-CM

## 2017-11-03 DIAGNOSIS — Z125 Encounter for screening for malignant neoplasm of prostate: Secondary | ICD-10-CM

## 2017-11-03 DIAGNOSIS — E118 Type 2 diabetes mellitus with unspecified complications: Secondary | ICD-10-CM

## 2017-11-03 DIAGNOSIS — Z23 Encounter for immunization: Secondary | ICD-10-CM | POA: Diagnosis not present

## 2017-11-03 DIAGNOSIS — E669 Obesity, unspecified: Secondary | ICD-10-CM

## 2017-11-03 DIAGNOSIS — Z1211 Encounter for screening for malignant neoplasm of colon: Secondary | ICD-10-CM

## 2017-11-03 DIAGNOSIS — I10 Essential (primary) hypertension: Secondary | ICD-10-CM | POA: Diagnosis not present

## 2017-11-03 LAB — CBC WITH DIFFERENTIAL/PLATELET
Basophils Absolute: 0.1 10*3/uL (ref 0.0–0.1)
Basophils Relative: 1.1 % (ref 0.0–3.0)
Eosinophils Absolute: 0.2 10*3/uL (ref 0.0–0.7)
Eosinophils Relative: 2.8 % (ref 0.0–5.0)
HCT: 40.9 % (ref 39.0–52.0)
Hemoglobin: 13.6 g/dL (ref 13.0–17.0)
LYMPHS ABS: 1.7 10*3/uL (ref 0.7–4.0)
Lymphocytes Relative: 22.6 % (ref 12.0–46.0)
MCHC: 33.1 g/dL (ref 30.0–36.0)
MCV: 86.8 fl (ref 78.0–100.0)
MONO ABS: 0.9 10*3/uL (ref 0.1–1.0)
Monocytes Relative: 12.3 % — ABNORMAL HIGH (ref 3.0–12.0)
NEUTROS PCT: 61.2 % (ref 43.0–77.0)
Neutro Abs: 4.5 10*3/uL (ref 1.4–7.7)
Platelets: 279 10*3/uL (ref 150.0–400.0)
RBC: 4.71 Mil/uL (ref 4.22–5.81)
RDW: 14.2 % (ref 11.5–15.5)
WBC: 7.4 10*3/uL (ref 4.0–10.5)

## 2017-11-03 LAB — COMPREHENSIVE METABOLIC PANEL
ALBUMIN: 4.6 g/dL (ref 3.5–5.2)
ALK PHOS: 56 U/L (ref 39–117)
ALT: 16 U/L (ref 0–53)
AST: 16 U/L (ref 0–37)
BILIRUBIN TOTAL: 0.5 mg/dL (ref 0.2–1.2)
BUN: 22 mg/dL (ref 6–23)
CALCIUM: 9.8 mg/dL (ref 8.4–10.5)
CO2: 29 mEq/L (ref 19–32)
CREATININE: 0.95 mg/dL (ref 0.40–1.50)
Chloride: 103 mEq/L (ref 96–112)
GFR: 85.35 mL/min (ref >60.00)
Glucose, Bld: 164 mg/dL — ABNORMAL HIGH (ref 70–99)
Potassium: 4.3 mEq/L (ref 3.5–5.1)
Sodium: 140 mEq/L (ref 135–145)
TOTAL PROTEIN: 6.9 g/dL (ref 6.0–8.3)

## 2017-11-03 LAB — LIPID PANEL
CHOLESTEROL: 132 mg/dL (ref 0–200)
HDL: 62.5 mg/dL (ref >39.00)
LDL Cholesterol: 60 mg/dL (ref 0–99)
NonHDL: 69.89
TRIGLYCERIDES: 50 mg/dL (ref 0.0–149.0)
Total CHOL/HDL Ratio: 2
VLDL: 10 mg/dL (ref 0.0–40.0)

## 2017-11-03 LAB — PSA: PSA: 3.73 ng/mL (ref 0.10–4.00)

## 2017-11-03 LAB — MICROALBUMIN / CREATININE URINE RATIO
Creatinine,U: 76.3 mg/dL
MICROALB/CREAT RATIO: 4.3 mg/g (ref 0.0–30.0)
Microalb, Ur: 3.3 mg/dL — ABNORMAL HIGH (ref 0.0–1.9)

## 2017-11-03 LAB — HEMOGLOBIN A1C: HEMOGLOBIN A1C: 7.9 % — AB (ref 4.6–6.5)

## 2017-11-03 LAB — TSH: TSH: 1.34 u[IU]/mL (ref 0.35–4.50)

## 2017-11-03 NOTE — Progress Notes (Signed)
Office Note 11/03/2017  CC:  Chief Complaint  Patient presents with  . Annual Exam    Pt is fasting.     HPI:  [name]. is a 62 y.o. White male who is here for annual health maintenance exam.  Says he feels very well.    Working 7d/week as usual.  Says he has been taking all his diabetic meds.  No home glucoses to report.  He did stop his lipitor for a while to see if it was contributing to his general body aches. It did not change anything so he has been taking it again for the last 2 mo. Not doing as much strenuous work lately and he has gained a few lbs. He eats a low Na diet.    Past Medical History:  Diagnosis Date  . Colon cancer screening    03/2014 iFOB negative.  . Diabetes mellitus with complication (HCC) 09/12/10   Has mild DPN.  HbA1c 12.6% at the time of dx.  . Elevated blood pressure reading without diagnosis of hypertension 12/26/2010   White coat HTN  . 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  . Hyperlipidemia   . Increased prostate specific antigen (PSA) velocity 2016   ? due to cycling?  Pt chose repeat PSA instead of Biopsy and it showed lower PSA value when he had not been cycling prior to PSA check.  . Toe ulcer due to DM [facility]) fall 2012   Right great toe    Past Surgical History:  Procedure Laterality Date  . ROOT CANAL    . TONSILLECTOMY      Family History  Problem Relation Age of Onset  . Diabetes Maternal Grandmother        type 2  . Cancer Maternal Grandfather        all over  . Arthritis Paternal Grandmother   . Heart attack Paternal Grandfather        smoke  . Alcohol abuse Paternal Grandfather        smoker and drinker  . Hodgkin's lymphoma Cousin   . Diabetes Father     Social History   Socioeconomic History  . Marital status: Married    Spouse name: Not on file  . Number of children: Not on file  . Years of education: Not on file  .  Highest education level: Not on file  Occupational History  . Not on file  Social Needs  . Financial resource strain: Not on file  . Food insecurity:    Worry: Not on file    Inability: Not on file  . Transportation needs:    Medical: Not on file    Non-medical: Not on file  Tobacco Use  . Smoking status: Never Smoker  . Smokeless tobacco: Former Neurosurgeon    Types: Snuff  . Tobacco comment: Grizzly  Substance and Sexual Activity  . Alcohol use: No  . Drug use: No  . Sexual activity: Yes    Partners: Female  Lifestyle  . Physical activity:    Days per week: Not on file    Minutes per session: Not on file  . Stress: Not on file  Relationships  . Social connections:    Talks on phone: Not on file    Gets together: Not on file    Attends religious service: Not on file    Active member of club or organization: Not  on file    Attends meetings of clubs or organizations: Not on file    Relationship status: Not on file  . Intimate partner violence:    Fear of current or ex partner: Not on file    Emotionally abused: Not on file    Physically abused: Not on file    Forced sexual activity: Not on file  Other Topics Concern  . Not on file  Social History Narrative   Married, has 2 grown sons.     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  Also works in his [employer].   No formal exercise regimen but very active--NOT SEDENTARY.   No T/A/Ds.    Outpatient Medications Prior to Visit  Medication Sig Dispense Refill  . atorvastatin (LIPITOR) 40 MG tablet Take 1 tablet (40 mg total) by mouth daily. 90 tablet 3  . Cyanocobalamin (VITAMIN B 12 PO) Take by mouth.    . EPINEPHrine 0.3 mg/0.3 mL IJ SOAJ injection Inject 0.3 mLs (0.3 mg total) into the muscle once. 2 Device 1  . glucose blood (FREESTYLE LITE) test strip Use to check blood sugar twice daily as directed 100 each 5  . Insulin Pen Needle (RELION PEN NEEDLES) 31G X 6 MM MISC Use as directed every day 50  each 6  . Krill Oil 500 MG CAPS Take 1 capsule by mouth daily.    Demetra Shiner Devices (B-D LANCET DEVICE) MISC Use as directed to check blood sugars 2 times per day 60 each 5  . liraglutide (VICTOZA) 18 MG/3ML SOPN INJECT 0.6 MG SUBCUTANEOUSLY ONCE DAILY FOR 7 DAYS THEN INCREASE TO 1.2 MG ONCE DAILY AS YOUR MAINTENANCE DOSE 3 pen 0  . metFORMIN (GLUCOPHAGE) 500 MG tablet TAKE 2 TABLETS BY MOUTH TWICE DAILY WITH MEALS 120 tablet 0  . Multiple Vitamins-Minerals (CENTRUM PO) Take 1 tablet by mouth daily.      . pioglitazone (ACTOS) 45 MG tablet TAKE 1 TABLET BY MOUTH ONCE DAILY 90 tablet 0  . Probiotic Product (PROBIOTIC-10 PO) Take 1 capsule by mouth daily.     No facility-administered medications prior to visit.     Allergies  Allergen Reactions  . Other     Peanuts - choking     ROS Review of Systems  Constitutional: Negative for appetite change, chills, fatigue and fever.  HENT: Negative for congestion, dental problem, ear pain and sore throat.   Eyes: Negative for discharge, redness and visual disturbance.  Respiratory: Negative for cough, chest tightness, shortness of breath and wheezing.   Cardiovascular: Negative for chest pain, palpitations and leg swelling.  Gastrointestinal: Negative for abdominal pain, blood in stool, diarrhea, nausea and vomiting.  Genitourinary: Negative for difficulty urinating, dysuria, flank pain, frequency, hematuria and urgency.  Musculoskeletal: Negative for arthralgias, back pain, joint swelling, myalgias and neck stiffness.  Skin: Negative for pallor and rash.  Neurological: Negative for dizziness, speech difficulty, weakness and headaches.  Hematological: Negative for adenopathy. Does not bruise/bleed easily.  Psychiatric/Behavioral: Negative for confusion and sleep disturbance. The patient is not nervous/anxious.     PE; Blood pressure (!) 175/101, pulse 80, temperature 98.1 F (36.7 C), temperature source Oral, resp. rate 16, height 5' 11.5"  (1.816 m), weight 235 lb 6 oz (106.8 kg), SpO2 98 %. Body mass index is 32.37 kg/m.  Gen: Alert, well appearing.  Patient is oriented to person, place, time, and situation. AFFECT: pleasant, lucid thought and speech. ENT: Ears: EACs clear, normal epithelium.  TMs with  good light reflex and landmarks bilaterally.  Eyes: no injection, icteris, swelling, or exudate.  EOMI, PERRLA. Nose: no drainage or turbinate edema/swelling.  No injection or focal lesion.  Mouth: lips without lesion/swelling.  Oral mucosa pink and moist.  Dentition intact and without obvious caries or gingival swelling.  Oropharynx without erythema, exudate, or swelling.  Neck: supple/nontender.  No LAD, mass, or TM.  Carotid pulses 2+ bilaterally, without bruits. CV: RRR, no m/r/g.   LUNGS: CTA bilat, nonlabored resps, good aeration in all lung fields. ABD: soft, NT, ND, BS normal.  No hepatospenomegaly or mass.  No bruits. EXT: no clubbing, cyanosis, or edema.  Musculoskeletal: no joint swelling, erythema, warmth, or tenderness.  ROM of all joints intact. Skin - no sores or suspicious lesions or rashes or color changes Pt declined rectal exam. Foot exam - ; no swelling, tenderness or skin or vascular lesions. Color and temperature is normal. Sensation is intact everywhere except he has no response to monofilament testing on 1st 2 toes of each foot.  Moderate callus at area of past diabetic ulcer on medial aspect of R great toe.  No erythema or fluctuance. Peripheral pulses are palpable. Toenails are normal.   Pertinent labs:  Lab Results  Component Value Date   TSH 1.15 08/10/2016   Lab Results  Component Value Date   WBC 7.0 08/10/2016   HGB 13.4 08/10/2016   HCT 40.9 08/10/2016   MCV 88.3 08/10/2016   PLT 258 08/10/2016   Lab Results  Component Value Date   CREATININE 0.73 03/31/2017   BUN 15 03/31/2017   NA 139 03/31/2017   K 4.9 03/31/2017   CL 100 03/31/2017   CO2 30 03/31/2017   Lab Results  Component  Value Date   ALT 18 08/10/2016   AST 18 08/10/2016   ALKPHOS 53 08/10/2016   BILITOT 0.7 08/10/2016   Lab Results  Component Value Date   CHOL 125 03/31/2017   Lab Results  Component Value Date   HDL 55.90 03/31/2017   Lab Results  Component Value Date   LDLCALC 57 03/31/2017   Lab Results  Component Value Date   TRIG 62.0 03/31/2017   Lab Results  Component Value Date   CHOLHDL 2 03/31/2017   Lab Results  Component Value Date   PSA 3.0 08/10/2016   PSA 2.72 08/27/2014   PSA 3.63 06/18/2014   Lab Results  Component Value Date   HGBA1C 9.3 (H) 03/31/2017    ASSESSMENT AND PLAN:   Health maintenance exam: Reviewed age and gender appropriate health maintenance issues (prudent diet, regular exercise, health risks of tobacco and excessive alcohol, use of seatbelts, fire alarms in home, use of sunscreen).  Also reviewed age and gender appropriate health screening as well as vaccine recommendations. Vaccines: due for Tdap--> given today.   Flu vaccine--> given today.  Shingrix-->#1 today. Labs: fasting HP + PSA and HbA1c. Prostate ca screening: DRE --he wants to decline this today b/c his experience in the past with this was painful , PSA today. Colon ca screening: has not had a screening colonoscopy.  In 2016 he did an iFOB which was negative. He wants to get iFOB again today.  An After Visit Summary was printed and given to the patient.  FOLLOW UP:  Return in about 3 months (around 02/03/2018) for routine chronic illness f/u.  Signed:  Santiago Bumpers, MD           11/03/2017

## 2017-11-03 NOTE — Patient Instructions (Signed)

## 2017-11-03 NOTE — Addendum Note (Signed)
Addended by: Smitty Knudsen on: 11/03/2017 09:54 AM   Modules accepted: Orders

## 2017-11-05 ENCOUNTER — Other Ambulatory Visit: Payer: Self-pay | Admitting: Family Medicine

## 2017-11-05 MED ORDER — LIRAGLUTIDE 18 MG/3ML ~~LOC~~ SOPN: PEN_INJECTOR | SUBCUTANEOUS | 5 refills | Status: DC

## 2017-11-17 ENCOUNTER — Other Ambulatory Visit: Payer: Self-pay | Admitting: Family Medicine

## 2017-12-17 ENCOUNTER — Other Ambulatory Visit: Payer: Self-pay | Admitting: Family Medicine

## 2017-12-27 ENCOUNTER — Other Ambulatory Visit: Payer: Self-pay | Admitting: Family Medicine

## 2018-02-01 ENCOUNTER — Encounter: Payer: Self-pay | Admitting: Family Medicine

## 2018-02-01 ENCOUNTER — Ambulatory Visit: Payer: BC Managed Care – PPO | Admitting: Family Medicine

## 2018-02-01 NOTE — Progress Notes (Deleted)
OFFICE VISIT  02/01/2018   CC: No chief complaint on file.    HPI:    Patient is a 63 y.o. Caucasian male who presents for 3 mo f/u DM 2, HLD, and white coat HTN.  DM: HbA1c improved last visit but I still recommended he increase his victoza to 1.8mg  qd.  He is also on metformin and actos at full dosing.  HLD: takes atorva 40mg  qd and seems to have no problems with side effects.  White coat HTN:   Past Medical History:  Diagnosis Date  . Colon cancer screening    03/2014 iFOB negative.  . Diabetes mellitus with complication (HCC) 09/12/10   Has mild DPN.  HbA1c 12.6% at the time of dx.  . Elevated blood pressure reading without diagnosis of hypertension 12/26/2010   White coat HTN  . 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  . Hyperlipidemia   . Increased prostate specific antigen (PSA) velocity 2016   ? due to cycling?  Pt chose repeat PSA instead of Biopsy and it showed lower PSA value when he had not been cycling prior to PSA check.  . Toe ulcer due to DM [facility]) fall 2012   Right great toe    Past Surgical History:  Procedure Laterality Date  . ROOT CANAL    . TONSILLECTOMY      Outpatient Medications Prior to Visit  Medication Sig Dispense Refill  . atorvastatin (LIPITOR) 40 MG tablet Take 1 tablet (40 mg total) by mouth daily. 90 tablet 3  . Cyanocobalamin (VITAMIN B 12 PO) Take by mouth.    . EPINEPHrine 0.3 mg/0.3 mL IJ SOAJ injection Inject 0.3 mLs (0.3 mg total) into the muscle once. 2 Device 1  . glucose blood (FREESTYLE LITE) test strip Use to check blood sugar twice daily as directed 100 each 5  . Insulin Pen Needle (RELION PEN NEEDLES) 31G X 6 MM MISC Use as directed every day 50 each 6  . Krill Oil 500 MG CAPS Take 1 capsule by mouth daily.    Demetra Shiner Devices (B-D LANCET DEVICE) MISC Use as directed to check blood sugars 2 times per day 60 each 5  . liraglutide (VICTOZA) 18 MG/3ML SOPN Inject  1.8 mg SQ qd 5 pen 5  . metFORMIN (GLUCOPHAGE) 500 MG tablet TAKE 2 TABLETS BY MOUTH TWICE DAILY WITH MEALS 360 tablet 0  . Multiple Vitamins-Minerals (CENTRUM PO) Take 1 tablet by mouth daily.      . pioglitazone (ACTOS) 45 MG tablet TAKE 1 TABLET BY MOUTH ONCE DAILY 90 tablet 0  . Probiotic Product (PROBIOTIC-10 PO) Take 1 capsule by mouth daily.     No facility-administered medications prior to visit.     Allergies  Allergen Reactions  . Other     Peanuts - choking     ROS As per HPI  PE: There were no vitals taken for this visit. ***  LABS:  Lab Results  Component Value Date   TSH 1.34 11/03/2017   Lab Results  Component Value Date   WBC 7.4 11/03/2017   HGB 13.6 11/03/2017   HCT 40.9 11/03/2017   MCV 86.8 11/03/2017   PLT 279.0 11/03/2017   Lab Results  Component Value Date   CREATININE 0.95 11/03/2017   BUN 22 11/03/2017   NA 140 11/03/2017   K 4.3 11/03/2017   CL 103 11/03/2017   CO2 29 11/03/2017  Lab Results  Component Value Date   ALT 16 11/03/2017   AST 16 11/03/2017   ALKPHOS 56 11/03/2017   BILITOT 0.5 11/03/2017   Lab Results  Component Value Date   CHOL 132 11/03/2017   Lab Results  Component Value Date   HDL 62.50 11/03/2017   Lab Results  Component Value Date   LDLCALC 60 11/03/2017   Lab Results  Component Value Date   TRIG 50.0 11/03/2017   Lab Results  Component Value Date   CHOLHDL 2 11/03/2017   Lab Results  Component Value Date   PSA 3.73 11/03/2017   PSA 3.0 08/10/2016   PSA 2.72 08/27/2014   Lab Results  Component Value Date   HGBA1C 7.9 (H) 11/03/2017    IMPRESSION AND PLAN:  1) DM 2:  2) HLD: tolerating statin.  LDL was 60 at lab check 3 mo ago. Continue to work on TLC.  3) White coat HTN:   Needs shingrix #2 today.  An After Visit Summary was printed and given to the patient.  FOLLOW UP: No follow-ups on file. 3 mo RCI  Signed:  Santiago BumpersPhil Ruble Pumphrey, MD           02/01/2018

## 2018-02-16 ENCOUNTER — Encounter: Payer: Self-pay | Admitting: Family Medicine

## 2018-02-16 ENCOUNTER — Ambulatory Visit: Payer: BC Managed Care – PPO | Admitting: Family Medicine

## 2018-02-16 VITALS — BP 158/98 | HR 90 | Temp 98.1°F | Resp 16 | Ht 71.5 in | Wt 231.1 lb

## 2018-02-16 DIAGNOSIS — J0100 Acute maxillary sinusitis, unspecified: Secondary | ICD-10-CM | POA: Diagnosis not present

## 2018-02-16 MED ORDER — AMOXICILLIN-POT CLAVULANATE 875-125 MG PO TABS: 1.0000 | ORAL_TABLET | Freq: Two times a day (BID) | ORAL | 0 refills | Status: DC

## 2018-02-16 NOTE — Progress Notes (Signed)
[name] , Jul 14, 1955, 63 y.o., male MRN: [id number] Patient Care Team    Relationship Specialty Notifications Start End  McGowen, Maryjean Morn, MD PCP - General Family Medicine  06/12/10   Jethro Bolus, MD (Inactive) Consulting Physician Urology  08/09/14     Chief Complaint  Patient presents with  . Nasal Congestion    x14 days. Took OTC meds and felt better and then the end of last week started feeling bad again.   . Wheezing    Since the end of last week      Subjective: Pt presents for an OV with complaints of nasal congestion of x 14 days duration.  Associated symptoms include nasal congestin, wheezing. He denies recent fever, chills.  He has diabetes, last a1c 7.9. Felt he got better for a few days, then last Friday it came on again. He endorsed wheezing, heavy chest feeling. Pt has tried mucinex, vit c , cough medicine to ease their symptoms.   Depression screen [facility] 2/9 03/31/2017  Decreased Interest 0  Down, Depressed, Hopeless 0  PHQ - 2 Score 0  Altered sleeping 1  Tired, decreased energy 0  Change in appetite 0  Feeling bad or failure about yourself  0  Trouble concentrating 0  Moving slowly or fidgety/restless 0  Suicidal thoughts 0  PHQ-9 Score 1  Difficult doing work/chores Not difficult at all    Allergies  Allergen Reactions  . Other     Peanuts - choking    Social History   Social History Narrative   Married, has 2 grown sons.     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  Also works in his [employer].   No formal exercise regimen but very active--NOT SEDENTARY.   No T/A/Ds.   Past Medical History:  Diagnosis Date  . Colon cancer screening    03/2014 iFOB negative.  . Diabetes mellitus with complication (HCC) 09/12/10   Has mild DPN.  HbA1c 12.6% at the time of dx.  . Elevated blood pressure reading without diagnosis of hypertension 12/26/2010   White coat HTN  . Food allergy Summer 2015   Corn chowder:  pt was referred to Allergist but failed to show for his appt.  . Hyperlipidemia   . Increased prostate specific antigen (PSA) velocity 2016   ? due to cycling?  Pt chose repeat PSA instead of Biopsy and it showed lower PSA value when he had not been cycling prior to PSA check.  . Toe ulcer due to DM [facility]) fall 2012   Right great toe   Past Surgical History:  Procedure Laterality Date  . ROOT CANAL    . TONSILLECTOMY     Family History  Problem Relation Age of Onset  . Diabetes Maternal Grandmother        type 2  . Cancer Maternal Grandfather        all over  . Arthritis Paternal Grandmother   . Heart attack Paternal Grandfather        smoke  . Alcohol abuse Paternal Grandfather        smoker and drinker  . Hodgkin's lymphoma Cousin   . Diabetes Father    Allergies as of 02/16/2018      Reactions   Other    Peanuts - choking       Medication List       Accurate as of February 16, 2018  1:59 PM. Always use your most recent  med list.        atorvastatin 40 MG tablet Commonly known as:  LIPITOR Take 1 tablet (40 mg total) by mouth daily.   B-D LANCET DEVICE Misc Use as directed to check blood sugars 2 times per day   CENTRUM PO Take 1 tablet by mouth daily.   EPINEPHrine 0.3 mg/0.3 mL Soaj injection Commonly known as:  EPI-PEN Inject 0.3 mLs (0.3 mg total) into the muscle once.   glucose blood test strip Commonly known as:  FREESTYLE LITE Use to check blood sugar twice daily as directed   Insulin Pen Needle 31G X 6 MM Misc Commonly known as:  RELION PEN NEEDLES Use as directed every day   B-D ULTRAFINE III SHORT PEN 31G X 8 MM Misc Generic drug:  Insulin Pen Needle daily. use as directed   Krill Oil 500 MG Caps Take 1 capsule by mouth daily.   liraglutide 18 MG/3ML Sopn Commonly known as:  VICTOZA Inject 1.8 mg SQ qd   metFORMIN 500 MG tablet Commonly known as:  GLUCOPHAGE TAKE 2 TABLETS BY MOUTH TWICE DAILY WITH MEALS   pioglitazone 45 MG  tablet Commonly known as:  ACTOS TAKE 1 TABLET BY MOUTH ONCE DAILY   PROBIOTIC-10 PO Take 1 capsule by mouth daily.   VITAMIN B 12 PO Take by mouth.       All past medical history, surgical history, allergies, family history, immunizations andmedications were updated in the EMR today and reviewed under the history and medication portions of their EMR.     ROS: Negative, with the exception of above mentioned in HPI   Objective:  BP (!) 158/98 (BP Location: Right Arm, Patient Position: Sitting, Cuff Size: Normal)   Pulse 90   Temp 98.1 F (36.7 C) (Oral)   Resp 16   Ht 5' 11.5" (1.816 m)   Wt 231 lb 2 oz (104.8 kg)   SpO2 98%   BMI 31.79 kg/m  Body mass index is 31.79 kg/m. Gen: Afebrile. No acute distress. Nontoxic in appearance, well developed, well nourished.  HENT: AT. Cale. Bilateral TM visualized with fullness, no erythema. MMM, no oral lesions. Bilateral nares with erythema, swelling and drainage. Throat without erythema or exudates. Mild cough. Eyes:Pupils Equal Round Reactive to light, Extraocular movements intact,  Conjunctiva without redness, discharge or icterus. Neck/lymp/endocrine: Supple,no lymphadenopathy CV: RRR  Chest: CTAB, no wheeze or crackles. Mild rhonchi. Good air movement, normal resp effort.  Abd: Soft.  NTND. BS present Skin: No rashes, purpura or petechiae.  Neuro: Normal gait. PERLA. EOMi. Alert. Oriented x3   No exam data present No results found. No results found for this or any previous visit (from the past 24 hour(s)).  Assessment/Plan: Joel KindsLawrence J Vanderberg Jr. is a 63 y.o. male present for OV for  Acute non-recurrent maxillary sinusitis Rest, hydrate.  + flonase, mucinex (DM if cough), nettie pot or nasal saline.  Augmentin prescribed, take until completed.  If cough present it can last up to 6-8 weeks.  F/U 2 weeks of not improved.   monitor BP- elevated today- normal at home.    Reviewed expectations re: course of current medical  issues.  Discussed self-management of symptoms.  Outlined signs and symptoms indicating need for more acute intervention.  Patient verbalized understanding and all questions were answered.  Patient received an After-Visit Summary.    No orders of the defined types were placed in this encounter.    Note is dictated utilizing voice recognition software. Although note  has been proof read prior to signing, occasional typographical errors still can be missed. If any questions arise, please do not hesitate to call for verification.   electronically signed by:  Howard Pouch, DO  [place]

## 2018-02-16 NOTE — Patient Instructions (Signed)
Rest, hydrate.  + flonase, mucinex (DM if cough), nettie pot or nasal saline.  Augmentin prescribed, take until completed.  If cough present it can last up to 6-8 weeks.  F/U 2 weeks of not improved.     Sinusitis, Adult Sinusitis is soreness and swelling (inflammation) of your sinuses. Sinuses are hollow spaces in the bones around your face. They are located:  Around your eyes.  In the middle of your forehead.  Behind your nose.  In your cheekbones. Your sinuses and nasal passages are lined with a fluid called mucus. Mucus drains out of your sinuses. Swelling can trap mucus in your sinuses. This lets germs (bacteria, virus, or fungus) grow, which leads to infection. Most of the time, this condition is caused by a virus. What are the causes? This condition is caused by:  Allergies.  Asthma.  Germs.  Things that block your nose or sinuses.  Growths in the nose (nasal polyps).  Chemicals or irritants in the air.  Fungus (rare). What increases the risk? You are more likely to develop this condition if:  You have a weak body defense system (immune system).  You do a lot of swimming or diving.  You use nasal sprays too much.  You smoke. What are the signs or symptoms? The main symptoms of this condition are pain and a feeling of pressure around the sinuses. Other symptoms include:  Stuffy nose (congestion).  Runny nose (drainage).  Swelling and warmth in the sinuses.  Headache.  Toothache.  A cough that may get worse at night.  Mucus that collects in the throat or the back of the nose (postnasal drip).  Being unable to smell and taste.  Being very tired (fatigue).  A fever.  Sore throat.  Bad breath. How is this diagnosed? This condition is diagnosed based on:  Your symptoms.  Your medical history.  A physical exam.  Tests to find out if your condition is short-term (acute) or long-term (chronic). Your doctor may: ? Check your nose for  growths (polyps). ? Check your sinuses using a tool that has a light (endoscope). ? Check for allergies or germs. ? Do imaging tests, such as an MRI or CT scan. How is this treated? Treatment for this condition depends on the cause and whether it is short-term or long-term.  If caused by a virus, your symptoms should go away on their own within 10 days. You may be given medicines to relieve symptoms. They include: ? Medicines that shrink swollen tissue in the nose. ? Medicines that treat allergies (antihistamines). ? A spray that treats swelling of the nostrils. ? Rinses that help get rid of thick mucus in your nose (nasal saline washes).  If caused by bacteria, your doctor may wait to see if you will get better without treatment. You may be given antibiotic medicine if you have: ? A very bad infection. ? A weak body defense system.  If caused by growths in the nose, you may need to have surgery. Follow these instructions at home: Medicines  Take, use, or apply over-the-counter and prescription medicines only as told by your doctor. These may include nasal sprays.  If you were prescribed an antibiotic medicine, take it as told by your doctor. Do not stop taking the antibiotic even if you start to feel better. Hydrate and humidify   Drink enough water to keep your pee (urine) pale yellow.  Use a cool mist humidifier to keep the humidity level in your home above  50%.  Breathe in steam for 10-15 minutes, 3-4 times a day, or as told by your doctor. You can do this in the bathroom while a hot shower is running.  Try not to spend time in cool or dry air. Rest  Rest as much as you can.  Sleep with your head raised (elevated).  Make sure you get enough sleep each night. General instructions   Put a warm, moist washcloth on your face 3-4 times a day, or as often as told by your doctor. This will help with discomfort.  Wash your hands often with soap and water. If there is no  soap and water, use hand sanitizer.  Do not smoke. Avoid being around people who are smoking (secondhand smoke).  Keep all follow-up visits as told by your doctor. This is important. Contact a doctor if:  You have a fever.  Your symptoms get worse.  Your symptoms do not get better within 10 days. Get help right away if:  You have a very bad headache.  You cannot stop throwing up (vomiting).  You have very bad pain or swelling around your face or eyes.  You have trouble seeing.  You feel confused.  Your neck is stiff.  You have trouble breathing. Summary  Sinusitis is swelling of your sinuses. Sinuses are hollow spaces in the bones around your face.  This condition is caused by tissues in your nose that become inflamed or swollen. This traps germs. These can lead to infection.  If you were prescribed an antibiotic medicine, take it as told by your doctor. Do not stop taking it even if you start to feel better.  Keep all follow-up visits as told by your doctor. This is important. This information is not intended to replace advice given to you by your health care provider. Make sure you discuss any questions you have with your health care provider. Document Released: 06/10/2007 Document Revised: 05/24/2017 Document Reviewed: 05/24/2017 Elsevier Interactive Patient Education  2019 [employer].

## 2018-03-01 ENCOUNTER — Encounter: Payer: Self-pay | Admitting: Family Medicine

## 2018-03-01 ENCOUNTER — Ambulatory Visit: Payer: BC Managed Care – PPO | Admitting: Family Medicine

## 2018-03-01 VITALS — BP 179/96 | HR 96 | Temp 98.2°F | Resp 16 | Ht 71.5 in | Wt 233.1 lb

## 2018-03-01 DIAGNOSIS — E7800 Pure hypercholesterolemia, unspecified: Secondary | ICD-10-CM | POA: Diagnosis not present

## 2018-03-01 DIAGNOSIS — E118 Type 2 diabetes mellitus with unspecified complications: Secondary | ICD-10-CM

## 2018-03-01 DIAGNOSIS — Z1211 Encounter for screening for malignant neoplasm of colon: Secondary | ICD-10-CM | POA: Diagnosis not present

## 2018-03-01 DIAGNOSIS — R030 Elevated blood-pressure reading, without diagnosis of hypertension: Secondary | ICD-10-CM | POA: Diagnosis not present

## 2018-03-01 LAB — POCT GLYCOSYLATED HEMOGLOBIN (HGB A1C): Hemoglobin A1C: 7.4 % — AB (ref 4.0–5.6)

## 2018-03-01 MED ORDER — ATORVASTATIN CALCIUM 40 MG PO TABS: 40.0000 mg | ORAL_TABLET | Freq: Every day | ORAL | 3 refills | Status: DC

## 2018-03-01 NOTE — Progress Notes (Signed)
OFFICE VISIT  03/01/2018   CC:  Chief Complaint  Patient presents with  . Follow-up    RCI, pt is fasting.    HPI:    Patient is a 63 y.o. Caucasian male who presents for overdue f/u of DM 2, white coat HTN, HLD. Feeling better regarding recent resp illness, just finished abx a couple days ago.  DM 2: currently on full dose victoza, actos 45mg  qd, and metformin 1000 mg bid. Compliant with meds. Best I could get from him regarding specifics is "130-150". Intermittently has polyuria. Admits he still needs to work on diet/exercise. He has "very little" feet DPN sx's.  HLD: atorva causes him myalgias, says he takes it a month or two and then has to go off when he can't take it anymore.  Sx's resolve after a week or so.  He wants to keep saying. Has improved from standpoint of fat/chol in diet.  BP: always very high her, always 120-130s/ 80s when checked outside of office.   Has taken a little ibuprofen lately.  Past Medical History:  Diagnosis Date  . Colon cancer screening    03/2014 iFOB negative.  . Diabetes mellitus with complication (HCC) 09/12/10   Has mild DPN.  HbA1c 12.6% at the time of dx.  . Elevated blood pressure reading without diagnosis of hypertension 12/26/2010   White coat HTN  . 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  . Hyperlipidemia   . Increased prostate specific antigen (PSA) velocity 2016   ? due to cycling?  Pt chose repeat PSA instead of Biopsy and it showed lower PSA value when he had not been cycling prior to PSA check.  . Toe ulcer due to DM [facility]) fall 2012   Right great toe    Past Surgical History:  Procedure Laterality Date  . ROOT CANAL    . TONSILLECTOMY      Outpatient Medications Prior to Visit  Medication Sig Dispense Refill  . B-D ULTRAFINE III SHORT PEN 31G X 8 MM MISC daily. use as directed    . Cyanocobalamin (VITAMIN B 12 PO) Take by mouth.    . EPINEPHrine 0.3  mg/0.3 mL IJ SOAJ injection Inject 0.3 mLs (0.3 mg total) into the muscle once. 2 Device 1  . glucose blood (FREESTYLE LITE) test strip Use to check blood sugar twice daily as directed 100 each 5  . Insulin Pen Needle (RELION PEN NEEDLES) 31G X 6 MM MISC Use as directed every day 50 each 6  . Lancet Devices (B-D LANCET DEVICE) MISC Use as directed to check blood sugars 2 times per day 60 each 5  . liraglutide (VICTOZA) 18 MG/3ML SOPN Inject 1.8 mg SQ qd 5 pen 5  . metFORMIN (GLUCOPHAGE) 500 MG tablet TAKE 2 TABLETS BY MOUTH TWICE DAILY WITH MEALS 360 tablet 0  . Multiple Vitamins-Minerals (CENTRUM PO) Take 1 tablet by mouth daily.      . pioglitazone (ACTOS) 45 MG tablet TAKE 1 TABLET BY MOUTH ONCE DAILY 90 tablet 0  . Probiotic Product (PROBIOTIC-10 PO) Take 1 capsule by mouth daily.    Marland Kitchen amoxicillin-clavulanate (AUGMENTIN) 875-125 MG tablet Take 1 tablet by mouth 2 (two) times daily. (Patient not taking: Reported on 03/01/2018) 20 tablet 0  . atorvastatin (LIPITOR) 40 MG tablet Take 1 tablet (40 mg total) by mouth daily. (Patient not taking: Reported on 03/01/2018) 90 tablet 3  . Krill Oil  500 MG CAPS Take 1 capsule by mouth daily.     No facility-administered medications prior to visit.     Allergies  Allergen Reactions  . Other     Peanuts - choking     ROS As per HPI  PE: Blood pressure (!) 179/96, pulse 96, temperature 98.2 F (36.8 C), temperature source Oral, resp. rate 16, height 5' 11.5" (1.816 m), weight 233 lb 2 oz (105.7 kg), SpO2 98 %. Gen: Alert, well appearing.  Patient is oriented to person, place, time, and situation. AFFECT: pleasant, lucid thought and speech. CV: RRR, no m/r/g.   LUNGS: CTA bilat, nonlabored resps, good aeration in all lung fields. EXT: no clubbing or cyanosis.  Trace R LL pitting edema, 1+ L LL pitting edema.    LABS:  Lab Results  Component Value Date   TSH 1.34 11/03/2017   Lab Results  Component Value Date   WBC 7.4 11/03/2017   HGB  13.6 11/03/2017   HCT 40.9 11/03/2017   MCV 86.8 11/03/2017   PLT 279.0 11/03/2017   Lab Results  Component Value Date   CREATININE 0.95 11/03/2017   BUN 22 11/03/2017   NA 140 11/03/2017   K 4.3 11/03/2017   CL 103 11/03/2017   CO2 29 11/03/2017   Lab Results  Component Value Date   ALT 16 11/03/2017   AST 16 11/03/2017   ALKPHOS 56 11/03/2017   BILITOT 0.5 11/03/2017   Lab Results  Component Value Date   CHOL 132 11/03/2017   Lab Results  Component Value Date   HDL 62.50 11/03/2017   Lab Results  Component Value Date   LDLCALC 60 11/03/2017   Lab Results  Component Value Date   TRIG 50.0 11/03/2017   Lab Results  Component Value Date   CHOLHDL 2 11/03/2017   Lab Results  Component Value Date   PSA 3.73 11/03/2017   PSA 3.0 08/10/2016   PSA 2.72 08/27/2014   Lab Results  Component Value Date   HGBA1C 7.9 (H) 11/03/2017   POC A1c today=7.4%  IMPRESSION AND PLAN:  1) DM 2; +DPN. Historically his control has been variable, has had some probs with med/diet compliance. POC A1c today= 7.4%-->no changes in meds today. I reminded him that he is overdue for annual diab retinpthy screening exam.  2) Hypercholesterolemia: he goes on and off atorva due to side effect of myalgias. He prefers to remain on this med and take it this way rather than try a different statin.  3) White coat HTN: pt consistently with normal bp OUT OF MEDICAL OFFICE.  4) Colon ca screening: iFOB 2016 neg.  He chose to do iFOB again today.  An After Visit Summary was printed and given to the patient.  FOLLOW UP: Return in about 3 months (around 05/30/2018) for routine chronic illness f/u.   Signed:  Santiago Bumpers, MD           03/01/2018

## 2018-03-21 ENCOUNTER — Other Ambulatory Visit: Payer: Self-pay | Admitting: Family Medicine

## 2018-03-21 NOTE — Telephone Encounter (Signed)
Pt saw dr Milinda Cave on 03-01-2018

## 2018-07-16 ENCOUNTER — Other Ambulatory Visit: Payer: Self-pay | Admitting: Family Medicine

## 2018-09-09 ENCOUNTER — Telehealth: Payer: Self-pay | Admitting: Family Medicine

## 2018-09-09 MED ORDER — AMOXICILLIN-POT CLAVULANATE 875-125 MG PO TABS: 1.0000 | ORAL_TABLET | Freq: Two times a day (BID) | ORAL | 0 refills | Status: DC

## 2018-09-09 NOTE — Telephone Encounter (Signed)
Patient is requesting antibiotic for a wound on his toe that he has been there for several years.

## 2018-09-09 NOTE — Addendum Note (Signed)
Addended by: Tammi Sou on: 09/09/2018 01:01 PM   Modules accepted: Orders

## 2018-09-09 NOTE — Telephone Encounter (Signed)
LM for pt to call back. Needs to schedule for in office visit to assess wound.

## 2018-09-09 NOTE — Telephone Encounter (Signed)
Pt has a hx of toe ulcer to DM. Still need an appt?

## 2018-09-09 NOTE — Telephone Encounter (Signed)
I'll sent in augmentin. However, he needs to see me in 1 wk to look at the wound (in office).-thx

## 2018-09-13 NOTE — Telephone Encounter (Signed)
LM for pt to return call to schedule appt.

## 2018-09-14 NOTE — Telephone Encounter (Signed)
Pt scheduled 9/22

## 2018-09-15 ENCOUNTER — Other Ambulatory Visit: Payer: Self-pay | Admitting: Family Medicine

## 2018-09-22 ENCOUNTER — Other Ambulatory Visit: Payer: Self-pay | Admitting: Family Medicine

## 2018-09-27 ENCOUNTER — Ambulatory Visit: Payer: BC Managed Care – PPO | Admitting: Family Medicine

## 2018-09-27 ENCOUNTER — Other Ambulatory Visit: Payer: Self-pay

## 2018-09-27 ENCOUNTER — Encounter: Payer: Self-pay | Admitting: Family Medicine

## 2018-09-27 VITALS — BP 187/120 | HR 88 | Temp 98.3°F | Resp 16 | Ht 71.5 in | Wt 235.0 lb

## 2018-09-27 DIAGNOSIS — L97519 Non-pressure chronic ulcer of other part of right foot with unspecified severity: Secondary | ICD-10-CM | POA: Diagnosis not present

## 2018-09-27 DIAGNOSIS — E11621 Type 2 diabetes mellitus with foot ulcer: Secondary | ICD-10-CM | POA: Diagnosis not present

## 2018-09-27 MED ORDER — AMOXICILLIN-POT CLAVULANATE 875-125 MG PO TABS: 1.0000 | ORAL_TABLET | Freq: Two times a day (BID) | ORAL | 0 refills | Status: DC

## 2018-09-27 NOTE — Progress Notes (Signed)
OFFICE VISIT  09/27/2018   CC:  Chief Complaint  Patient presents with  . assess wound    HPI:    Patient is a 63 y.o. Caucasian male who presents for assessment of wound. Hx of R great toe ulcer back in 2012 when initially dx'd with DM.  Took quite a while and help of wound clinic to heal this.  Onset of ulcer on R great toe around 3 wks ago: burning, oozing blister. Called here and I did not have any appt's: I rx'd augmentin, he started applying zinc topically. Has been off-loading some.  Last several days he is feeling much better--it has closed. Has hard area of dead skin, debris present .  No paiin now.  Oozing has completely stopped. He never had any fever or rash.  BP last night at home was 128/73.  His bp is always up here b/c he is always nervous and excitable when here.   Past Medical History:  Diagnosis Date  . Colon cancer screening    03/2014 iFOB negative.  . Diabetes mellitus with complication (HCC) 09/12/10   Has mild DPN.  HbA1c 12.6% at the time of dx.  . Elevated blood pressure reading without diagnosis of hypertension 12/26/2010   White coat HTN  . 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  . Hyperlipidemia   . Increased prostate specific antigen (PSA) velocity 2016   ? due to cycling?  Pt chose repeat PSA instead of Biopsy and it showed lower PSA value when he had not been cycling prior to PSA check.  . Toe ulcer due to DM [facility]) fall 2012   Right great toe    Past Surgical History:  Procedure Laterality Date  . ROOT CANAL    . TONSILLECTOMY      Outpatient Medications Prior to Visit  Medication Sig Dispense Refill  . B-D ULTRAFINE III SHORT PEN 31G X 8 MM MISC daily. use as directed    . Cyanocobalamin (VITAMIN B 12 PO) Take by mouth.    Marland Kitchen glucose blood (FREESTYLE LITE) test strip Use to check blood sugar twice daily as directed 100 each 5  . Insulin Pen Needle (B-D ULTRAFINE III SHORT  PEN) 31G X 8 MM MISC USE AS DIRECTED ONCE DAILY 100 each 11  . Lancet Devices (B-D LANCET DEVICE) MISC Use as directed to check blood sugars 2 times per day 60 each 5  . liraglutide (VICTOZA) 18 MG/3ML SOPN INJECT 0.6MG  SUBCUTANEOUSLY ONCE DAILY FOR 7 DAYS THEN INCREASE TO 1.2MG  ONCE DAILY AS YOUR MAINTENANCE DOSE 6 mL 0  . metFORMIN (GLUCOPHAGE) 500 MG tablet TAKE 2 TABLETS BY MOUTH TWICE DAILY WITH MEALS 360 tablet 0  . Multiple Vitamins-Minerals (CENTRUM PO) Take 1 tablet by mouth daily.      . pioglitazone (ACTOS) 45 MG tablet Take 1 tablet by mouth once daily 90 tablet 0  . Probiotic Product (PROBIOTIC-10 PO) Take 1 capsule by mouth daily.    Marland Kitchen atorvastatin (LIPITOR) 40 MG tablet Take 1 tablet (40 mg total) by mouth daily. (Patient not taking: Reported on 09/27/2018) 90 tablet 3  . EPINEPHrine 0.3 mg/0.3 mL IJ SOAJ injection Inject 0.3 mLs (0.3 mg total) into the muscle once. (Patient not taking: Reported on 09/27/2018) 2 Device 1  . amoxicillin-clavulanate (AUGMENTIN) 875-125 MG tablet Take 1 tablet by mouth 2 (two) times daily. (Patient not taking: Reported on 09/27/2018) 20 tablet 0  No facility-administered medications prior to visit.     Allergies  Allergen Reactions  . Other     Peanuts - choking     ROS As per HPI  PE: Blood pressure (!) 187/120, pulse 88, temperature 98.3 F (36.8 C), temperature source Temporal, resp. rate 16, height 5' 11.5" (1.816 m), weight 235 lb (106.6 kg), SpO2 99 %. Gen: Alert, well appearing.  Patient is oriented to person, place, time, and situation. AFFECT: pleasant, lucid thought and speech. R great toe with hypertrophic, indurated skin medially where he has a skin graft from his prior ulcer in 2012.  Next to this on bottom of toe he has a hole about 2 mm in diameter, w/out tenderness, discharge, or erythema.  It is 3-4 mm dee.  I can see some granulation tissue. No rash or streaking.  ROM intact.  DP and PT pulses 2+ bilat.  LABS:  Lab Results   Component Value Date   HGBA1C 7.4 (A) 03/01/2018     IMPRESSION AND PLAN:  R great toe diabetic ulcer, healing nicely now. Continue with off-loading, zinc oxide salve, and monitor for changes/worsening. Call immediately if worsening.  I also gave augmentin rx for him to start IF his sx's in toe return--but he still needs to come in office.  An After Visit Summary was printed and given to the patient.  FOLLOW UP: Return in about 4 weeks (around 10/25/2018) for f/u to ulcer and DM 2.  Signed:  Crissie Sickles, MD           09/27/2018

## 2018-10-16 ENCOUNTER — Other Ambulatory Visit: Payer: Self-pay | Admitting: Family Medicine

## 2018-10-24 ENCOUNTER — Encounter: Payer: Self-pay | Admitting: Family Medicine

## 2018-10-24 ENCOUNTER — Ambulatory Visit: Payer: BC Managed Care – PPO | Admitting: Family Medicine

## 2018-10-24 ENCOUNTER — Other Ambulatory Visit: Payer: Self-pay

## 2018-10-24 VITALS — BP 140/90 | HR 84 | Temp 98.1°F | Resp 16 | Ht 71.5 in | Wt 243.6 lb

## 2018-10-24 DIAGNOSIS — E7800 Pure hypercholesterolemia, unspecified: Secondary | ICD-10-CM | POA: Diagnosis not present

## 2018-10-24 DIAGNOSIS — Z23 Encounter for immunization: Secondary | ICD-10-CM | POA: Diagnosis not present

## 2018-10-24 DIAGNOSIS — L97519 Non-pressure chronic ulcer of other part of right foot with unspecified severity: Secondary | ICD-10-CM | POA: Diagnosis not present

## 2018-10-24 DIAGNOSIS — E118 Type 2 diabetes mellitus with unspecified complications: Secondary | ICD-10-CM

## 2018-10-24 DIAGNOSIS — E11621 Type 2 diabetes mellitus with foot ulcer: Secondary | ICD-10-CM | POA: Diagnosis not present

## 2018-10-24 LAB — COMPREHENSIVE METABOLIC PANEL
ALT: 20 U/L (ref 0–53)
AST: 20 U/L (ref 0–37)
Albumin: 4.7 g/dL (ref 3.5–5.2)
Alkaline Phosphatase: 72 U/L (ref 39–117)
BUN: 19 mg/dL (ref 6–23)
CO2: 29 mEq/L (ref 19–32)
Calcium: 9.7 mg/dL (ref 8.4–10.5)
Chloride: 100 mEq/L (ref 96–112)
Creatinine, Ser: 0.79 mg/dL (ref 0.40–1.50)
GFR: 99.03 mL/min (ref >60.00)
Glucose, Bld: 228 mg/dL — ABNORMAL HIGH (ref 70–99)
Potassium: 4.9 mEq/L (ref 3.5–5.1)
Sodium: 137 mEq/L (ref 135–145)
Total Bilirubin: 0.5 mg/dL (ref 0.2–1.2)
Total Protein: 7.1 g/dL (ref 6.0–8.3)

## 2018-10-24 LAB — LIPID PANEL
Cholesterol: 131 mg/dL (ref 0–200)
HDL: 54.2 mg/dL (ref >39.00)
LDL Cholesterol: 66 mg/dL (ref 0–99)
NonHDL: 76.6
Total CHOL/HDL Ratio: 2
Triglycerides: 52 mg/dL (ref 0.0–149.0)
VLDL: 10.4 mg/dL (ref 0.0–40.0)

## 2018-10-24 LAB — MICROALBUMIN / CREATININE URINE RATIO
Creatinine,U: 41.5 mg/dL
Microalb Creat Ratio: 19.2 mg/g (ref 0.0–30.0)
Microalb, Ur: 8 mg/dL — ABNORMAL HIGH (ref 0.0–1.9)

## 2018-10-24 LAB — HEMOGLOBIN A1C: Hgb A1c MFr Bld: 8.5 % — ABNORMAL HIGH (ref 4.6–6.5)

## 2018-10-24 NOTE — Addendum Note (Signed)
Addended by: Deveron Furlong D on: 10/24/2018 11:42 AM   Modules accepted: Orders

## 2018-10-24 NOTE — Progress Notes (Signed)
OFFICE VISIT  10/24/2018   CC:  Chief Complaint  Patient presents with  . Follow-up    ulcer and DM 2   HPI:    Patient is a 63 y.o. Caucasian male who presents for 1 mo f/u diabetic ulcer on R great toe, f/u DM.    Toe feels better. Says he has no pain, drainage, or swelling.  He has worn out his post op shoe, says it doesn't feel like it is off-loading much lately b/c breaking down. No f/c/malaise.  Says glucoses are "better" but cannot be specific. Has gained 10 lbs over the covid pandemic time although he says he feels well.  ROS: no CP, no SOB, no wheezing, no cough, no dizziness, no HAs, no rashes, no melena/hematochezia.  No polyuria or polydipsia.  No myalgias or arthralgias.   Past Medical History:  Diagnosis Date  . Colon cancer screening    03/2014 iFOB negative.  . Diabetes mellitus with complication ([place]) 05/01/04   Has mild DPN.  HbA1c 12.6% at the time of dx.  . Elevated blood pressure reading without diagnosis of hypertension 12/26/2010   White coat HTN  . 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  . Hyperlipidemia   . Increased prostate specific antigen (PSA) velocity 2016   ? due to cycling?  Pt chose repeat PSA instead of Biopsy and it showed lower PSA value when he had not been cycling prior to PSA check.  . Obesity, Class I, BMI 30-34.9   . Toe ulcer due to DM [facility]) fall 2012   Right great toe    Past Surgical History:  Procedure Laterality Date  . ROOT CANAL    . TONSILLECTOMY      Outpatient Medications Prior to Visit  Medication Sig Dispense Refill  . atorvastatin (LIPITOR) 40 MG tablet Take 1 tablet (40 mg total) by mouth daily. 90 tablet 3  . B-D ULTRAFINE III SHORT PEN 31G X 8 MM MISC daily. use as directed    . Cyanocobalamin (VITAMIN B 12 PO) Take by mouth.    . EPINEPHrine 0.3 mg/0.3 mL IJ SOAJ injection Inject 0.3 mLs (0.3 mg total) into the muscle once. 2 Device 1  .  glucose blood (FREESTYLE LITE) test strip Use to check blood sugar twice daily as directed 100 each 5  . Insulin Pen Needle (B-D ULTRAFINE III SHORT PEN) 31G X 8 MM MISC USE AS DIRECTED ONCE DAILY 100 each 11  . Lancet Devices (B-D LANCET DEVICE) MISC Use as directed to check blood sugars 2 times per day 60 each 5  . liraglutide (VICTOZA) 18 MG/3ML SOPN INCREASE 0.6 MG SUBQ ONCE DAILY FOR 7 DAYS THEN INCREASE TO 1.2 MG ONCE DAILY AS MAINTENANCE DOSE 6 mL 0  . metFORMIN (GLUCOPHAGE) 500 MG tablet TAKE 2 TABLETS BY MOUTH TWICE DAILY WITH MEALS 360 tablet 0  . Multiple Vitamins-Minerals (CENTRUM PO) Take 1 tablet by mouth daily.      . pioglitazone (ACTOS) 45 MG tablet Take 1 tablet by mouth once daily 90 tablet 0  . Probiotic Product (PROBIOTIC-10 PO) Take 1 capsule by mouth daily.    Marland Kitchen amoxicillin-clavulanate (AUGMENTIN) 875-125 MG tablet Take 1 tablet by mouth 2 (two) times daily. (Patient not taking: Reported on 10/24/2018) 20 tablet 0   No facility-administered medications prior to visit.     Allergies  Allergen Reactions  . Other     Peanuts -  choking     ROS As per HPI  PE: Blood pressure 140/90, pulse 84, temperature 98.1 F (36.7 C), temperature source Temporal, resp. rate 16, height 5' 11.5" (1.816 m), weight 243 lb 9.6 oz (110.5 kg), SpO2 98 %. Body mass index is 33.5 kg/m. Gen: Alert, well appearing.  Patient is oriented to person, place, time, and situation. AFFECT: pleasant, lucid thought and speech. R foot great toe with large callus, no open ulceration and no drainage. No erythema or tenderness.  LABS:  Lab Results  Component Value Date   TSH 1.34 11/03/2017   Lab Results  Component Value Date   WBC 7.4 11/03/2017   HGB 13.6 11/03/2017   HCT 40.9 11/03/2017   MCV 86.8 11/03/2017   PLT 279.0 11/03/2017   Lab Results  Component Value Date   CREATININE 0.95 11/03/2017   BUN 22 11/03/2017   NA 140 11/03/2017   K 4.3 11/03/2017   CL 103 11/03/2017   CO2 29  11/03/2017   Lab Results  Component Value Date   ALT 16 11/03/2017   AST 16 11/03/2017   ALKPHOS 56 11/03/2017   BILITOT 0.5 11/03/2017   Lab Results  Component Value Date   CHOL 132 11/03/2017   Lab Results  Component Value Date   HDL 62.50 11/03/2017   Lab Results  Component Value Date   LDLCALC 60 11/03/2017   Lab Results  Component Value Date   TRIG 50.0 11/03/2017   Lab Results  Component Value Date   CHOLHDL 2 11/03/2017   Lab Results  Component Value Date   PSA 3.73 11/03/2017   PSA 3.0 08/10/2016   PSA 2.72 08/27/2014   Lab Results  Component Value Date   HGBA1C 7.4 (A) 03/01/2018    IMPRESSION AND PLAN:  1) DM 2, hx of poor control and noncompliance.  Control as of 02/2018 wasn't too bad. HbA1c today. Urine microalb/cr and BMET today.  2) HLD: tolerating statin->he admits he usually takes this for a month or so and gets off it b/c of gluteal/hamstring myalgias (which resolve when he gets off statin).  He has been back on atorva for a month or so now.  3) R great toe ulcer: improving. He has large callus where he got skin graft. Hard to tell what pairing this down might reveal, but I told him I don't want to get that aggressive with this type of situation.  I recommended he return to wound care clinic to see what they would do but he persisted in his report that he thinks things are better and this is not needed at this time. He wants to continue topical zinc prep and we'll get him a new post op shoe today for off loading. He would get much better if he could REALLY offload it for a week or so but he says he just can't do this. Signs/symptoms to call or return for were reviewed and pt expressed understanding.  4) White coat HTN: just up a little here today. He maintains that all out-of-office bp checks are <130/80.  An After Visit Summary was printed and given to the patient.  FOLLOW UP: Return in about 3 months (around 01/24/2019) for annual CPE  (fasting).  Signed:  Santiago Bumpers, MD           10/24/2018

## 2018-10-26 ENCOUNTER — Other Ambulatory Visit: Payer: Self-pay

## 2018-10-26 ENCOUNTER — Other Ambulatory Visit: Payer: Self-pay | Admitting: Family Medicine

## 2018-10-26 DIAGNOSIS — E1149 Type 2 diabetes mellitus with other diabetic neurological complication: Secondary | ICD-10-CM

## 2018-10-26 MED ORDER — VICTOZA 18 MG/3ML ~~LOC~~ SOPN: PEN_INJECTOR | SUBCUTANEOUS | 3 refills | Status: DC

## 2018-12-12 ENCOUNTER — Other Ambulatory Visit: Payer: Self-pay | Admitting: Family Medicine

## 2018-12-14 ENCOUNTER — Other Ambulatory Visit: Payer: Self-pay | Admitting: Family Medicine

## 2018-12-22 ENCOUNTER — Other Ambulatory Visit (HOSPITAL_COMMUNITY)
Admission: RE | Admit: 2018-12-22 | Discharge: 2018-12-22 | Disposition: A | Discharge status: Home / Self Care | Payer: BC Managed Care – PPO | Attending: Internal Medicine | Admitting: Internal Medicine

## 2018-12-22 ENCOUNTER — Encounter (HOSPITAL_BASED_OUTPATIENT_CLINIC_OR_DEPARTMENT_OTHER): Payer: BC Managed Care – PPO | Attending: Internal Medicine | Admitting: Internal Medicine

## 2018-12-22 ENCOUNTER — Other Ambulatory Visit (HOSPITAL_COMMUNITY): Payer: Self-pay | Admitting: Internal Medicine

## 2018-12-22 ENCOUNTER — Ambulatory Visit (HOSPITAL_COMMUNITY)
Admission: RE | Admit: 2018-12-22 | Discharge: 2018-12-22 | Disposition: A | Discharge status: Home / Self Care | Payer: BC Managed Care – PPO | Source: Ambulatory Visit | Attending: Internal Medicine | Admitting: Internal Medicine

## 2018-12-22 ENCOUNTER — Other Ambulatory Visit: Payer: Self-pay

## 2018-12-22 DIAGNOSIS — E11621 Type 2 diabetes mellitus with foot ulcer: Secondary | ICD-10-CM | POA: Diagnosis not present

## 2018-12-22 DIAGNOSIS — A4902 Methicillin resistant Staphylococcus aureus infection, unspecified site: Secondary | ICD-10-CM | POA: Diagnosis not present

## 2018-12-22 DIAGNOSIS — I10 Essential (primary) hypertension: Secondary | ICD-10-CM | POA: Diagnosis not present

## 2018-12-22 DIAGNOSIS — L97514 Non-pressure chronic ulcer of other part of right foot with necrosis of bone: Secondary | ICD-10-CM | POA: Insufficient documentation

## 2018-12-22 DIAGNOSIS — M869 Osteomyelitis, unspecified: Secondary | ICD-10-CM | POA: Insufficient documentation

## 2018-12-22 DIAGNOSIS — E1142 Type 2 diabetes mellitus with diabetic polyneuropathy: Secondary | ICD-10-CM | POA: Diagnosis not present

## 2018-12-22 DIAGNOSIS — E1169 Type 2 diabetes mellitus with other specified complication: Secondary | ICD-10-CM | POA: Diagnosis present

## 2018-12-22 DIAGNOSIS — L97509 Non-pressure chronic ulcer of other part of unspecified foot with unspecified severity: Secondary | ICD-10-CM | POA: Insufficient documentation

## 2018-12-22 DIAGNOSIS — E785 Hyperlipidemia, unspecified: Secondary | ICD-10-CM | POA: Insufficient documentation

## 2018-12-22 NOTE — Progress Notes (Addendum)
[name] ([id number]) Visit Report for 12/22/2018 Allergy List Details Patient Name: Date of Service: [name] 12/22/2018 10:30 AM Medical Record [id number] Patient Account Number: [id number] Date of Birth/Sex: Treating RN: 01/14/1955 (63 y.o. Male) Cherylin Mylar Primary Care Sidonia Nutter: Nicoletta Ba Other Clinician: Referring Kendre Jacinto: Treating Elzie Sheets/Extender:Robson, Lottie Rater, [name] Weeks in Treatment: 0 Allergies Active Allergies peanut Reaction: anaphylaxis Severity: Severe Allergy Notes Electronic Signature(s) Signed: 12/22/2018 5:33:40 PM By: Cherylin Mylar Entered By: Cherylin Mylar on 12/22/2018 11:05:07 -------------------------------------------------------------------------------- Arrival Information Details Patient Name: Date of Service: [name] 12/22/2018 10:30 AM Medical Record [id number] Patient Account Number: [id number] Date of Birth/Sex: Treating RN: 10/14/1955 (63 y.o. Male) Cherylin Mylar Primary Care Terique Kawabata: Nicoletta Ba Other Clinician: Referring Aaleyah Witherow: Treating Gennifer Potenza/Extender:Robson, Lottie Rater, [name] Weeks in Treatment: 0 Visit Information Patient Arrived: Ambulatory Arrival Time: 10:54 Accompanied By: family member Transfer Assistance: None Patient Identification Verified: Yes Secondary Verification Process Yes Completed: Patient Requires Transmission- No Based Precautions: Patient Has Alerts: Yes Patient Alerts: Right ABI: non compress History Since Last Visit Electronic Signature(s) Signed: 12/22/2018 5:33:40 PM By: Cherylin Mylar Entered By: Cherylin Mylar on 12/22/2018 11:38:58 -------------------------------------------------------------------------------- Clinic Level of Care Assessment Details Patient Name: Date of Service: [name] 12/22/2018 10:30 AM Medical Record [id number] Patient Account Number:  [id number] Date of Birth/Sex: Treating RN: 12/15/1955 (63 y.o. Male) Shawn Stall Primary Care Cadon Raczka: Nicoletta Ba Other Clinician: Referring Demichael Traum: Treating Adonay Scheier/Extender:Robson, Lottie Rater, [name] Weeks in Treatment: 0 Clinic Level of Care Assessment Items TOOL 1 Quantity Score X - Use when EandM and Procedure is performed on INITIAL visit 1 0 ASSESSMENTS - Nursing Assessment / Reassessment X - General Physical Exam (combine w/ comprehensive assessment (listed just below) 1 20 when performed on new pt. evals) X - Comprehensive Assessment (HX, ROS, Risk Assessments, Wounds Hx, etc.) 1 25 ASSESSMENTS - Wound and Skin Assessment / Reassessment X - Dermatologic / Skin Assessment (not related to wound area) 1 10 ASSESSMENTS - Ostomy and/or Continence Assessment and Care []  - Incontinence Assessment and Management 0 []  - Ostomy Care Assessment and Management (repouching, etc.) 0 PROCESS - Coordination of Care X - Simple Patient / Family Education for ongoing care 1 15 []  - Complex (extensive) Patient / Family Education for ongoing care 0 X - Staff obtains , Records, Test Results / Process Orders 1 10 []  - Staff telephones HHA, Nursing Homes / Clarify orders / etc 0 []  - Routine Transfer to another Facility (non-emergent condition) 0 []  - Routine Hospital Admission (non-emergent condition) 0 X - New Admissions / / Ordering NPWT, Apligraf, etc. 1 15 []  - Emergency Hospital Admission (emergent condition) 0 PROCESS - Special Needs []  - Pediatric / Minor Patient Management 0 []  - Isolation Patient Management 0 []  - Hearing / Language / Visual special needs 0 []  - Assessment of Community assistance (transportation, D/C planning, etc.) 0 []  - Additional assistance / Altered mentation 0 []  - Support Surface(s) Assessment (bed, cushion, seat, etc.) 0 INTERVENTIONS - Miscellaneous []  - External ear exam 0 []  - Patient Transfer (multiple  staff / / Similar devices) 0 []  - Simple Staple / Suture removal (25 or less) 0 []  - Complex Staple / Suture removal (26 or more) 0 []  - Hypo/Hyperglycemic Management (do not check if billed separately) 0 X - Ankle / Brachial Index (ABI) - do not check if billed separately 1 15 Has the patient been seen at the hospital within the last three years: Yes Total Score: 110  Level Of Care: New/Established - Level 3 Electronic Signature(s) Signed: 12/22/2018 5:34:18 PM By: Shawn Stall Entered By: Shawn Stall on 12/22/2018 12:03:25 -------------------------------------------------------------------------------- Lower Extremity Assessment Details Patient Name: Date of Service: [name], [name] 12/22/2018 10:30 AM Medical Record [id number] Patient Account Number: [id number] Date of Birth/Sex: Treating RN: 10-Jul-1955 (63 y.o. Male) Cherylin Mylar Primary Care Janeka Libman: Nicoletta Ba Other Clinician: Referring Hughie Melroy: Treating Emerie Vanderkolk/Extender:Robson, Lottie Rater, [name] Weeks in Treatment: 0 Edema Assessment Assessed: [Left: No] [Right: No] E[Left: dema] [Right: :] Calf Left: Right: Point of Measurement: 35 cm From Medial Instep cm 41 cm Ankle Left: Right: Point of Measurement: 7 cm From Medial Instep cm 26 cm Vascular Assessment Pulses: Dorsalis Pedis Palpable: [Right:Yes] Electronic Signature(s) Signed: 12/22/2018 5:33:40 PM By: Cherylin Mylar Entered By: Cherylin Mylar on 12/22/2018 11:25:28 -------------------------------------------------------------------------------- Multi Wound Chart Details Patient Name: Date of Service: [name]. 12/22/2018 10:30 AM Medical Record [id number] Patient Account Number: [id number] Date of Birth/Sex: Treating RN: May 02, 1955 (63 y.o. 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] Comorbid History: [3:Hypertension, Type II Diabetes, Neuropathy] [N/A:N/A] Date Acquired: [3:09/06/2018] [N/A:N/A] Weeks of Treatment: [3:0] [N/A:N/A] Wound Status: [3:Open] [N/A:N/A] Measurements L x W x D 1.6x0.7x1.7 [N/A:N/A] (cm) Area (cm) : [3:0.88] [N/A:N/A] Volume (cm) : [3:1.495] [N/A:N/A] % Reduction in Area: [3:0.00%] [N/A:N/A] % Reduction in Volume: 0.00% [N/A:N/A] Classification: [3:Grade 2] [N/A:N/A] Exudate Amount: [3:Small] [N/A:N/A] Exudate Type: [3:Serous] [N/A:N/A] Exudate Color: [3:amber] [N/A:N/A] Wound Margin: [3:Well defined, not attached] [N/A:N/A] Granulation Amount: [3:Medium (34-66%)] [N/A:N/A] Granulation Quality: [3:Pink] [N/A:N/A] Necrotic Amount: [3:Medium (34-66%)] [N/A:N/A] Exposed Structures: [3:Fat Layer (Subcutaneous Tissue) Exposed: Yes Bone: Yes Fascia: No Tendon: No Muscle: No Joint: No] [N/A:N/A] Epithelialization: [3:None] [N/A:N/A] Debridement: [3:Debridement - Excisional] [N/A:N/A] Pre-procedure [3:12:03] [N/A:N/A] Verification/Time Out Taken: Pain Control: [3:Lidocaine 4% Topical Solution] [N/A:N/A] Tissue Debrided: [3:Callus, Subcutaneous, Slough] [N/A:N/A] Level: [3:Skin/Subcutaneous Tissue] [N/A:N/A] Debridement Area (sq cm):2 [N/A:N/A] Instrument: [3:Blade, Curette, Forceps] [N/A:N/A] Specimen: [3:Swab] [N/A:N/A] Number of Specimens [3:1] [N/A:N/A] Taken: Bleeding: [3:Minimum] [N/A:N/A] Hemostasis Achieved: [3:Pressure] [N/A:N/A] Procedural Pain: [3:0] [N/A:N/A] Post Procedural Pain: [3:0] [N/A:N/A] Debridement  Treatment Procedure was tolerated [N/A:N/A] Response: [3:well] Post Debridement [3:1.6x0.7x1.5] [N/A:N/A] Measurements L x W x D (cm) Post Debridement [3:1.319] [N/A:N/A] Volume: (cm) Procedures Performed: Debridement [N/A:N/A] Treatment Notes Electronic Signature(s) Signed: 12/22/2018 5:01:00 PM By: Baltazar Najjar MD Entered By: Baltazar Najjar on 12/22/2018 12:23:58 -------------------------------------------------------------------------------- Multi-Disciplinary Care Plan Details Patient Name: Date of Service: [name]. 12/22/2018 10:30 AM Medical Record [id number] Patient Account Number: [id number] Date of Birth/Sex: Treating RN: May 06, 1955 (63 y.o. Male) Shawn Stall Primary Care Nickolaus Bordelon: Nicoletta Ba Other Clinician: Referring Jermone Geister: Treating Tracker Mance/Extender:Robson, Lottie Rater, [name] Weeks in Treatment: 0 Active Inactive Abuse / Safety / Falls / Self Care Management Nursing Diagnoses: Potential for falls Goals: Patient will remain injury free related to falls Date Initiated: 12/22/2018 Target Resolution Date: 01/19/2019 Goal Status: Active Interventions: Assess fall risk on admission and as needed Assess self care needs on admission and as needed Provide education on fall prevention Notes: Nutrition Nursing Diagnoses: Potential for alteratiion in Nutrition/Potential for imbalanced nutrition Goals: Patient/caregiver verbalizes understanding of need to maintain therapeutic glucose control per primary care physician Date Initiated: 12/22/2018 Target  Resolution Date: 01/13/2019 Goal Status: Active Interventions: Assess HgA1c results as ordered upon admission and as needed Provide education on elevated blood sugars and impact on wound healing Provide education on nutrition Treatment Activities: Obtain HgA1c : 12/22/2018 Patient referred to Primary Care Physician for further nutritional evaluation : 12/22/2018 Notes: Orientation  to the Wound Care Program Nursing Diagnoses: Knowledge deficit related to the wound healing center program Goals: Patient/caregiver will verbalize understanding of the [place] Program Date Initiated: 12/22/2018 Target Resolution Date: 01/13/2019 Goal Status: Active Interventions: Provide education on orientation to the wound center Notes: Wound/Skin Impairment Nursing Diagnoses: Knowledge deficit related to ulceration/compromised skin integrity Goals: Patient/caregiver will verbalize understanding of skin care regimen Date Initiated: 12/22/2018 Target Resolution Date: 01/13/2019 Goal Status: Active Interventions: Assess patient/caregiver ability to obtain necessary supplies Assess patient/caregiver ability to perform ulcer/skin care regimen upon admission and as needed Assess ulceration(s) every visit Provide education on ulcer and skin care Treatment Activities: Skin care regimen initiated : 12/22/2018 Topical wound management initiated : 12/22/2018 Notes: Electronic Signature(s) Signed: 12/22/2018 5:34:18 PM By: Deon Pilling Entered By: Deon Pilling on 12/22/2018 12:02:13 -------------------------------------------------------------------------------- Pain Assessment Details Patient Name: Date of Service: [name], [name] 12/22/2018 10:30 AM Medical Record [id number] Patient Account Number: [id number] Date of Birth/Sex: Treating RN: [birth year] (63 y.o. Male) Kela Millin Primary Care Veora Fonte: Shawnie Dapper Other Clinician: Referring Shaquill Iseman: Treating Lear Carstens/Extender:Robson, [name], [name] Weeks in Treatment: 0 Active Problems Location of Pain Severity and Description of Pain Patient Has Paino No Site Locations Pain Management and Medication Current Pain Management: Electronic Signature(s) Signed: 12/22/2018 5:33:40 PM By: Kela Millin Entered By: Kela Millin on 12/22/2018  11:32:56 -------------------------------------------------------------------------------- Patient/Caregiver Education Details Faylene Kurtz 12/17/2020andnbsp10:30 Patient Name: Date of Service: J. AM Medical Record Patient Account Number: [id number] Number: Treating RN: Deon Pilling Date of Birth/Gender: 10-Aug-1955 (63 y.o. Other Clinician: Male) Treating Linton Ham Primary Care Physician/Extender: Shawnie Dapper Physician: Suella [place] in Treatment: 0 Referring Physician: Shawnie Dapper Education Assessment Education Provided To: Patient Education Topics Provided Welcome To [facility]: Handouts: Welcome To [facility] [place] Methods: Explain/Verbal, Printed Responses: Reinforcements needed Wound/Skin Impairment: Handouts: Caring for Your Ulcer, Skin Care Do's and Dont's Methods: Explain/Verbal, Printed Responses: Reinforcements needed Electronic Signature(s) Signed: 12/22/2018 5:34:18 PM By: Deon Pilling Entered By: Deon Pilling on 12/22/2018 12:02:57 -------------------------------------------------------------------------------- Wound Assessment Details Patient Name: [name] Date of Service: 12/22/2018 10:30 AM Medical Record [id number] Patient Account Number: [id number] Date of Birth/Sex: [birth year] (63 y.o. Male) Treating RN: Kela Millin Primary Care Isra Lindy: Shawnie Dapper Other Clinician: Referring Ayvah Caroll: Treating Inayah Woodin/Extender:Robson, [name], [name] Weeks in Treatment: 0 Wound Status Wound Number: 3 Primary Diabetic Wound/Ulcer of the Lower Etiology: Extremity Wound Location: Toe Great - Medial Wound Status: Open Wounding Event: Blister Comorbid Hypertension, Type II Diabetes, Date Acquired: 09/06/2018 History: Neuropathy Weeks Of Treatment: 0 Clustered Wound: No Photos Wound Measurements Length: (cm) 1.6 Width: (cm) 0.7 Depth: (cm) 1.7 Area: (cm) 0.88 Volume: (cm)  1.495 Wound Description Classification: Grade 2 Wound Margin: Well defined, not attached Exudate Amount: Medium Exudate Type: Serous Exudate Color: amber Wound Bed Granulation Amount: Medium (34-66%) Granulation Quality: Pink Necrotic Amount: Medium (34-66%) Necrotic Quality: Adherent Slough After Cleansing: No rino Yes Exposed Structure osed: No (Subcutaneous Tissue) Exposed: Yes osed: No osed: No sed: No ed: Yes % Reduction in Area: 0% % Reduction in Volume: 0% Epithelialization: None Tunneling: No Undermining: No Foul Odor Slough/Fib Fascia Exp Fat Layer Tendon Exp Muscle Exp Joint Expo Bone Expos Electronic  Signature(s) Signed: 12/26/2018 6:06:17 PM By: Benjaman KindlerJones, Dedrick EMT/HBOT Signed: 12/28/2018 4:54:21 PM By: Cherylin Mylarwiggins, Shannon Previous Signature: 12/22/2018 5:33:40 PM Version By: Cherylin Mylarwiggins, Shannon Previous Signature: 12/22/2018 5:34:18 PM Version By: Shawn Stalleaton, [name] Entered By: Benjaman KindlerJones, Dedrick on 12/26/2018 17:32:11 -------------------------------------------------------------------------------- Vitals Details Patient Name: Date of Service: Joel EhlersWILLIAMS, [name] 12/22/2018 10:30 AM Medical Record [id number] Patient Account Number: [id number] Date of Birth/Sex: Treating RN: Jan 04, 1956 (63 y.o. Male) Cherylin Mylarwiggins, Shannon Primary Care Kayode Petion: Nicoletta BaMCGOWEN, [name] Other Clinician: Referring Shristi Scheib: Treating Desirae Mancusi/Extender:Robson, Lottie RaterMichael MCGOWEN, [name] Weeks in Treatment: 0 Vital Signs Time Taken: 11:00 Temperature (F): 97.8 Height (in): 72 Pulse (bpm): 96 Source: Stated Respiratory Rate (breaths/min): 17 Weight (lbs): 220 Blood Pressure (mmHg): 171/85 Source: Stated Capillary Blood Glucose (mg/dl): 132110 Body Mass Index (BMI): 29.8 Reference Range: 80 - 120 mg / dl Notes patient stated last CBG was 110 Electronic Signature(s) Signed: 12/22/2018 5:33:40 PM By: Cherylin Mylarwiggins, Shannon Entered By: Cherylin Mylarwiggins, Shannon on 12/22/2018 11:04:03

## 2018-12-22 NOTE — Progress Notes (Signed)
[name], [name] ([id number]) Visit Report for 12/22/2018 Chief Complaint Document Details Patient Name: Date of Service: [name], [name] 12/22/2018 10:30 AM Medical Record [id number] Patient Account Number: [id number] Date of Birth/Sex: Treating RN: 07-01-1955 (63 y.o. Male) Primary Care Provider: Nicoletta Ba Other Clinician: Referring Provider: Treating Provider/Extender:Shandricka Monroy, Lottie Rater, [name] Weeks in Treatment: 0 Information Obtained from: Patient Chief Complaint 12/22/2018; patient returns to clinic for review of a wound on the right medial great toe which is in the same position as a wound he was previously cared for in [id number] Electronic Signature(s) Signed: 12/22/2018 5:01:00 PM By: Baltazar Najjar MD Entered By: Baltazar Najjar on 12/22/2018 12:24:51 -------------------------------------------------------------------------------- Debridement Details Patient Name: [name] Date of Service: 12/22/2018 10:30 AM Medical Record [id number] Patient Account Number: [id number] Date of Birth/Sex: 06-13-1955 (63 y.o. Male) Treating RN: Shawn Stall Primary Care Provider: Nicoletta Ba Other Clinician: Referring Provider: Treating Provider/Extender:Lindyn Vossler, Lottie Rater, [name] Weeks in Treatment: 0 Debridement Performed for Wound #3 Medial Toe Great Assessment: Performed By: Physician Maxwell Caul., MD Debridement Type: Debridement Severity of Tissue Pre Bone involvement without necrosis Debridement: Level of Consciousness (Pre- Awake and Alert procedure): Pre-procedure Verification/Time Out Taken: Yes - 12:03 Start Time: 12:04 Pain Control: Lidocaine 4% Topical Solution Total Area Debrided (L x W): 2 (cm) x 1 (cm) = 2 (cm) Tissue and other material Viable, Non-Viable, Callus, Slough, Subcutaneous, Skin: Dermis , Fibrin/Exudate, debrided: Slough Level: Skin/Subcutaneous Tissue Debridement Description:  Excisional Instrument: Blade, Curette, Forceps Specimen: Swab, Number of Specimens Taken: 1 Bleeding: Minimum Hemostasis Achieved: Pressure End Time: 12:09 Procedural Pain: 0 Post Procedural Pain: 0 Response to Treatment: Procedure was tolerated well Level of Consciousness Awake and Alert (Post-procedure): Post Debridement Measurements of Total Wound Length: (cm) 1.6 Width: (cm) 0.7 Depth: (cm) 1.5 Volume: (cm) 1.319 Character of Wound/Ulcer Post Improved Debridement: Bone involvement without Severity of Tissue Post Debridement: necrosis Post Procedure Diagnosis Same as Pre-procedure Electronic Signature(s) Signed: 12/22/2018 5:01:00 PM By: Baltazar Najjar MD Signed: 12/22/2018 5:34:18 PM By: Shawn Stall Entered By: Shawn Stall on 12/22/2018 12:28:43 -------------------------------------------------------------------------------- HPI Details Patient Name: Date of Service: [name] 12/22/2018 10:30 AM Medical Record [id number] Patient Account Number: [id number] Date of Birth/Sex: Treating RN: 10-11-1955 (63 y.o. Male) Primary Care Provider: Nicoletta Ba Other Clinician: Referring Provider: Treating Provider/Extender:Brunella Wileman, Lottie Rater, [name] Weeks in Treatment: 0 History of Present Illness HPI Description: ADMISSION 12/22/2018 This is a 63 year old type II diabetic patient who was cared for in this clinic from 2012 through 2013 by Dr. Jimmey Ralph for a wound in the same location on the right medial toe. I do not have records from this time however the patient states that he was treated with an advanced treatment product and a prolonged course of total contact casting. He had an MRI of the foot but is unaware whether he had osteomyelitis at that time. He states that sometime toward the beginning of September he noticed a blister in this area with surrounding callus. He saw his primary doctor he was given a course of Augmentin and was put in a  surgical shoe. At one point it was felt to be healing nicely. He followed with his primary doctor on 10/19 noted large areas of callus in the wound. Area. He has not been x-rayed or cultured. He has been using topical antibiotic ointment. Past medical history includes type 2 diabetes with polyneuropathy, whitecoat hypertension and hyperlipidemia ABI in our clinic was noncompressible on the right Electronic Signature(s) Signed: 12/22/2018 5:01:00 PM By: Baltazar Najjar  MD Entered By: Linton Ham on 12/22/2018 12:30:00 -------------------------------------------------------------------------------- Physical Exam Details Patient Name: Date of Service: [name], [name] 12/22/2018 10:30 AM Medical Record [id number] Patient Account Number: [id number] Date of Birth/Sex: Treating RN: 11-12-1955 (63 y.o. Male) Primary Care Provider: Shawnie Dapper Other Clinician: Referring Provider: Treating Provider/Extender:Jayston Trevino, [name], [name] Weeks in Treatment: 0 Constitutional Patient is hypotensive.. Pulse regular and within target range for patient.Marland Kitchen Respirations regular, non-labored and within target range.. Temperature is normal and within the target range for the patient.Marland Kitchen Appears in no distress. Eyes Conjunctivae clear. No discharge.no icterus. Respiratory work of breathing is normal. Cardiovascular Pedal pulses are robust. Musculoskeletal No palpable abnormalities of the interphalangeal joint. Integumentary (Hair, Skin) Some erythema and warmth on the dorsal part of the right great toe no swelling no tenderness. Neurological Moderate loss of vibration sense in the plantar right foot. Decreased light touch and pain. Psychiatric appears at normal baseline. Notes Wound exam; the area in question is on the medial part of the right great toe. This is deep and I think probes under the interphalangeal joint there is palpable bone at the tip of this. Copious amounts of  callus removed from the circumference necrotic debris from the wound surface no bone debridement was done. Following debridement the deep culture was done Electronic Signature(s) Signed: 12/22/2018 5:01:00 PM By: Linton Ham MD Entered By: Linton Ham on 12/22/2018 12:36:44 -------------------------------------------------------------------------------- Physician Orders Details Patient Name: Date of Service: [name] 12/22/2018 10:30 AM Medical Record [id number] Patient Account Number: [id number] Date of Birth/Sex: Treating RN: 1955/09/30 (63 y.o. Male) Deon Pilling Primary Care Provider: Shawnie Dapper Other Clinician: Referring Provider: Treating Provider/Extender:Nefertiti Mohamad, [name], [name] Weeks in Treatment: 0 Verbal / Phone Orders: No Diagnosis Coding ICD-10 Coding Code Description E11.621 Type 2 diabetes mellitus with foot ulcer Follow-up Appointments Return Appointment in 1 week. - Thursday Dressing Change Frequency Change dressing every day. Wound Cleansing May shower and wash wound with soap and water. - with dressing changes. protect dressing on days not changing. Primary Wound Dressing Wound #3 Medial Toe Great Calcium Alginate with Silver Secondary Dressing Kerlix/Rolled Gauze Dry Gauze Edema Control Elevate legs to the level of the heart or above for 30 minutes daily and/or when sitting, a frequency of: - 3-4 times a day throughout the day. Other: - limit walking and standing while having the wound. Off-Loading Wound #3 Medial Toe Great Wedge shoe to: - front offloading wedge shoe use while walking and standing. Additional Orders / Instructions Follow Nutritious Diet - increase protein and vegetables within diabetic diet. closely monitor blood glucose. Laboratory Bacteria identified in Unspecified specimen by Anaerobe culture (MICRO) - culture of right great toe wound. will call you with any abnormal results. - (ICD10  E11.621 - Type 2 diabetes mellitus with foot ulcer) LOINC Code: 578-4 Convenience Name: Anerobic culture Radiology X-ray, toes - x-ray of right great toe related to non-healing diabetic foot ulcer. CPT code [phone]. - (ICD10 E11.621 - Type 2 diabetes mellitus with foot ulcer) Patient Medications Allergies: peanut Notifications Medication Indication Start End lidocaine DOSE topical 4 % gel - gel topical applied prior to debridement by MD. Electronic Signature(s) Signed: 12/22/2018 5:01:00 PM By: Linton Ham MD Signed: 12/22/2018 5:34:18 PM By: Deon Pilling Entered By: Deon Pilling on 12/22/2018 12:21:07 -------------------------------------------------------------------------------- Prescription 12/22/2018 Patient Name: [name] Provider: Linton Ham MD Date of Birth: [birth year] NPI#: [id number] Sex: Male DEA#: [id number] Phone #: [phone] License #: [id number] Patient Address: [place] [address] [place], [place] [address] [address] [place], [place] [address] [phone] Allergies peanut Reaction: anaphylaxis Severity: Severe Provider's Orders X-ray, toes - ICD10: E11.621 - x-ray of right great toe related to non-healing diabetic foot ulcer. CPT code 21308. Signature(s): Date(s): Prescription 12/22/2018 Patient Name: [name] Provider: Baltazar Najjar MD Date of Birth: Jul 07, 1955 NPI#: [id number] Sex: Male DEA#: [id number] Phone #: [phone] License #: [id number] Patient Address: [address] [facility] [address] [place], [place] [address] [address] [place], [place] [address] [phone] Allergies peanut Reaction: anaphylaxis Severity: Severe Medication Medication: Route: Strength: Form: lidocaine 4 % topical gel topical 4% gel Class: TOPICAL LOCAL ANESTHETICS Dose: Frequency / Time: Indication: gel topical applied  prior to debridement by MD. Number of Refills: Number of Units: 0 Generic Substitution: Start Date: End Date: One Time Use: Substitution Permitted No Note to Pharmacy: Signature(s): Date(s): Electronic Signature(s) Signed: 12/22/2018 5:01:00 PM By: Baltazar Najjar MD Signed: 12/22/2018 5:34:18 PM By: Shawn Stall Entered By: Shawn Stall on 12/22/2018 12:21:08 --------------------------------------------------------------------------------  Problem List Details Patient Name: Date of Service: [name] 12/22/2018 10:30 AM Medical Record [id number] Patient Account Number: [id number] Date of Birth/Sex: Treating RN: [birth year] (63 y.o. 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 Male) Primary Care Provider: Nicoletta Ba Other Clinician: Referring Provider: Treating Provider/Extender:Lavonna Lampron, Lottie Rater, [name] Weeks in Treatment: 0 Subjective Chief Complaint Information obtained from Patient 12/22/2018; patient returns to clinic for review of a wound on the right medial great toe which is in the same  position as a wound he was previously cared for in 2012[id number] History of Present Illness (HPI) ADMISSION 12/22/2018 This is a 63 year old type II diabetic patient who was cared for in this clinic from 2012 through 2013 by Dr. Jimmey Ralph for a wound in the same location on the right medial toe. I do not have records from this time however the patient states that he was treated with an advanced treatment product and a prolonged course of total contact casting. He had an MRI of the foot but is unaware whether he had osteomyelitis at that time. He states that sometime toward the beginning of September he noticed a blister in this area with surrounding callus. He saw his primary doctor he was given a course of Augmentin and was put in a surgical shoe. At one point it was felt to be healing nicely. He followed with his primary doctor on 10/19 noted large areas of callus in the wound. Area. He has not been x-rayed or cultured. He has been using topical antibiotic ointment. Past medical history includes type 2 diabetes with polyneuropathy, whitecoat hypertension and hyperlipidemia ABI in our clinic was noncompressible on the right Patient History Information obtained from Patient. Allergies peanut (Severity: Severe, Reaction: anaphylaxis) Family History Cancer - Maternal Grandparents, Diabetes - Father, Heart Disease - Father,Paternal Grandparents,  No family history of Hereditary Spherocytosis, Hypertension, Kidney Disease, Lung Disease, Seizures, Stroke, Thyroid Problems, Tuberculosis. Social History Never smoker, Marital Status - Married, Alcohol Use - Rarely, Drug Use - No History, Caffeine Use - Daily. Medical History Cardiovascular Patient has history of Hypertension Endocrine Patient has history of Type II Diabetes Neurologic Patient has history of Neuropathy Patient is treated with Oral Agents. Medical And Surgical History Notes Cardiovascular hyperlipidemia Integumentary  (Skin) previous wound to right great toe Review of Systems (ROS) Constitutional Symptoms (General Health) Denies complaints or symptoms of Fatigue, Fever, Chills, Marked Weight Change. Eyes Denies complaints or symptoms of Dry Eyes, Vision Changes, Glasses / Contacts. Ear/Nose/Mouth/Throat Denies complaints or symptoms of Chronic sinus problems or rhinitis. Respiratory Denies complaints or symptoms of Chronic or frequent coughs, Shortness of Breath. Cardiovascular Denies complaints or symptoms of Chest pain. Gastrointestinal Denies complaints or symptoms of Frequent diarrhea, Nausea, Vomiting. Genitourinary Denies complaints or symptoms of Frequent urination. Musculoskeletal Denies complaints or symptoms of Muscle Pain, Muscle Weakness. Neurologic Denies complaints or symptoms of Numbness/parasthesias. Psychiatric Denies complaints or symptoms of Claustrophobia, Suicidal. Objective Constitutional Patient is hypotensive.. Pulse regular and within target range for patient.. RespiraMarland Kitchentions regular, non-labored and within target range.. Temperature is normal and within the target range for the patient.Marland Kitchen Appears in no distress. Vitals Time Taken: 11:00 AM, Height: 72 in, Source: Stated, Weight: 220 lbs, Source: Stated, BMI: 29.8, Temperature: 97.8 F, Pulse: 96 bpm, Respiratory Rate: 17 breaths/min, Blood Pressure: 171/85 mmHg, Capillary Blood Glucose: 110 mg/dl. General Notes: patient stated last CBG was 110 Eyes Conjunctivae clear. No discharge.no icterus. Respiratory work of breathing is normal. Cardiovascular Pedal pulses are robust. Musculoskeletal No palpable abnormalities of the interphalangeal joint. Neurological Moderate loss of vibration sense in the plantar right foot. Decreased light touch and pain. Psychiatric appears at normal baseline. General Notes: Wound exam; the area in question is on the medial part of the right great toe. This is deep and I think probes  under the interphalangeal joint there is palpable bone at the tip of this. Copious amounts of callus removed from the circumference necrotic debris from the wound surface no bone debridement was done. Following debridement the deep culture was done Integumentary (Hair, Skin) Some erythema and warmth on the dorsal part of the right great toe no swelling no tenderness. Wound #3 status is Open. Original cause of wound was Blister. The wound is located on the Medial Toe Great. The wound measures 1.6cm length x 0.7cm width x 1.7cm depth; 0.88cm^2 area and 1.495cm^3 volume. There is bone and Fat Layer (Subcutaneous Tissue) Exposed exposed. There is no tunneling or undermining noted. There is a medium amount of serous drainage noted. The wound margin is well defined and not attached to the wound base. There is medium (34-66%) pink granulation within the wound bed. There is a medium (34-66%) amount of necrotic tissue within the wound bed including Adherent Slough. Assessment Active Problems ICD-10 Type 2 diabetes mellitus with foot ulcer Non-pressure chronic ulcer of other part of right foot with necrosis of bone Procedures Wound #3 Pre-procedure diagnosis of Wound #3 is a Diabetic Wound/Ulcer of the Lower Extremity located on the Medial Toe Great .Severity of Tissue Pre Debridement is: Bone involvement without necrosis. There was a Excisional Skin/Subcutaneous Tissue Debridement with a total area of 2 sq cm performed by Maxwell Caul., MD. With the following instrument(s): Blade, Curette, and Forceps to remove Viable and Non-Viable tissue/material. Material removed includes Callus, Subcutaneous Tissue, Slough, Skin: Dermis, and  Fibrin/Exudate after achieving pain control using Lidocaine 4% Topical Solution. 1 specimen was taken by a Swab and sent to the lab per facility protocol. A time out was conducted at 12:03, prior to the start of the procedure. A Minimum amount of bleeding was controlled  with Pressure. The procedure was tolerated well with a pain level of 0 throughout and a pain level of 0 following the procedure. Post Debridement Measurements: 1.6cm length x 0.7cm width x 1.5cm depth; 1.319cm^3 volume. Character of Wound/Ulcer Post Debridement is improved. Severity of Tissue Post Debridement is: Bone involvement without necrosis. Post procedure Diagnosis Wound #3: Same as Pre-Procedure Plan Follow-up Appointments: Return Appointment in 1 week. - Thursday Dressing Change Frequency: Change dressing every day. Wound Cleansing: May shower and wash wound with soap and water. - with dressing changes. protect dressing on days not changing. Primary Wound Dressing: Wound #3 Medial Toe Great: Calcium Alginate with Silver Secondary Dressing: Kerlix/Rolled Gauze Dry Gauze Edema Control: Elevate legs to the level of the heart or above for 30 minutes daily and/or when sitting, a frequency of: - 3-4 times a day throughout the day. Other: - limit walking and standing while having the wound. Off-Loading: Wound #3 Medial Toe Great: Wedge shoe to: - front offloading wedge shoe use while walking and standing. Additional Orders / Instructions: Follow Nutritious Diet - increase protein and vegetables within diabetic diet. closely monitor blood glucose. Radiology ordered were: X-ray, toes - x-ray of right great toe related to non-healing diabetic foot ulcer. CPT code [id number]. Laboratory ordered were: Anerobic culture - culture of right great toe wound. will call you with any abnormal results. The following medication(s) was prescribed: lidocaine topical 4 % gel gel topical applied prior to debridement by MD. was prescribed at facility 1. Deep wound on the medial part of the right great toe in the setting of type 2 diabetes with some degree of diabetic neuropathy.. Highly concerning for osteomyelitis. 2. Culture and plain x-ray ordered no empiric antibiotics for now. 3. Silver alginate  as the primary dressing change daily 4. Forefoot offloading boot. He is an active man on his feet 24 hours a day per his wife. I have talked to him about this. Previously in this clinic he will use the total contact cast. I explained that the total contact cast does nothing but offload the wound. I have asked him to consider cutting back on his activity until we can determine the extent of his problem 5. Follow-up next week Electronic Signature(s) Signed: 12/22/2018 12:37:15 PM By: Baltazar Najjarobson, Autry Prust MD Entered By: Baltazar Najjarobson, Abril Cappiello on 12/22/2018 12:37:14 -------------------------------------------------------------------------------- HxROS Details Patient Name: Date of Service: Joel EhlersWILLIAMS, [name] 12/22/2018 10:30 AM Medical Record [id number] Patient Account Number: [id number] Date of Birth/Sex: Treating RN: 05/21/1955 (63 y.o. Male) Cherylin Mylarwiggins, Shannon Primary Care Provider: Nicoletta BaMCGOWEN, [name] Other Clinician: Referring Provider: Treating Provider/Extender:Henrick Mcgue, Lottie RaterMichael MCGOWEN, [name] Weeks in Treatment: 0 Information Obtained From Patient Constitutional Symptoms (General Health) Complaints and Symptoms: Negative for: Fatigue; Fever; Chills; Marked Weight Change Eyes Complaints and Symptoms: Negative for: Dry Eyes; Vision Changes; Glasses / Contacts Ear/Nose/Mouth/Throat Complaints and Symptoms: Negative for: Chronic sinus problems or rhinitis Respiratory Complaints and Symptoms: Negative for: Chronic or frequent coughs; Shortness of Breath Cardiovascular Complaints and Symptoms: Negative for: Chest pain Medical History: Positive for: Hypertension Past Medical History Notes: hyperlipidemia Gastrointestinal Complaints and Symptoms: Negative for: Frequent diarrhea; Nausea; Vomiting Genitourinary Complaints and Symptoms: Negative for: Frequent urination Musculoskeletal Complaints and Symptoms: Negative for: Muscle Pain; Muscle Weakness Neurologic Complaints and  Symptoms: Negative for: Numbness/parasthesias Medical History: Positive for: Neuropathy Psychiatric Complaints and Symptoms: Negative for: Claustrophobia; Suicidal Hematologic/Lymphatic Endocrine Medical History: Positive for: Type II Diabetes Time with diabetes: 8 years Treated with: Oral agents Immunological Integumentary (Skin) Medical History: Past Medical History Notes: previous wound to right great toe Oncologic Immunizations Pneumococcal Vaccine: Received Pneumococcal Vaccination: No Implantable Devices None Family and Social History Cancer: Yes - Maternal Grandparents; Diabetes: Yes - Father; Heart Disease: Yes - Father,Paternal Grandparents; Hereditary Spherocytosis: No; Hypertension: No; Kidney Disease: No; Lung Disease: No; Seizures: No; Stroke: No; Thyroid Problems: No; Tuberculosis: No; Never smoker; Marital Status - Married; Alcohol Use: Rarely; Drug Use: No History; Caffeine Use: Daily; Financial Concerns: No; Food, Clothing or Shelter Needs: No; Support System Lacking: No; Transportation Concerns: No Electronic Signature(s) Signed: 12/22/2018 5:01:00 PM By: Baltazar Najjar MD Signed: 12/22/2018 5:33:40 PM By: Cherylin Mylar Entered By: Cherylin Mylar on 12/22/2018 11:16:53 -------------------------------------------------------------------------------- SuperBill Details Patient Name: Date of Service: [name] 12/22/2018 Medical Record [id number] Patient Account Number: [id number] Date of Birth/Sex: Treating RN: 02/13/1955 (63 y.o. Male) Primary Care Provider: Nicoletta Ba Other Clinician: Referring Provider: Treating Provider/Extender:Shanti Agresti, Lottie Rater, [name] Weeks in Treatment: 0 Diagnosis Coding ICD-10 Codes Code Description E11.621 Type 2 diabetes mellitus with foot ulcer L97.514 Non-pressure chronic ulcer of other part of right foot with necrosis of bone Facility Procedures CPT4 Code Description: [id number]  - DEB SUBQ TISSUE 20 SQ CM/< ICD-10 Diagnosis Description L97.514 Non-pressure chronic ulcer of other part of right foot w Modifier: ith necrosis of Quantity: 1 bone Physician Procedures CPT4 Code Description: [id number] - WC PHYS LEVEL 4 - NEW PT ICD-10 Diagnosis Description E11.621 Type 2 diabetes mellitus with foot ulcer Modifier: 25 Quantity: 1 CPT4 Code Description: [id number] 11042 - WC PHYS SUBQ TISS 20 SQ CM ICD-10 Diagnosis Description L97.514 Non-pressure chronic ulcer of other part of right foot w Modifier: ith necrosis of Quantity: 1 bone Electronic Signature(s) Signed: 12/22/2018 5:01:00 PM By: Baltazar Najjar MD Entered By: Baltazar Najjar on 12/22/2018 12:37:51

## 2018-12-22 NOTE — Progress Notes (Signed)
[name], [name] ([id number]) Visit Report for 12/22/2018 Abuse/Suicide Risk Screen Details Patient Name: Date of Service: [name], [name] 12/22/2018 10:30 AM Medical Record [id number] Patient Account Number: [id number] Date of Birth/Sex: Treating RN: December 08, 1955 (63 y.o. Male) Cherylin Mylar Primary Care Anyssa Sharpless: Nicoletta Ba Other Clinician: Referring Braelon Sprung: Treating Yannely Kintzel/Extender:Robson, Lottie Rater, [name] Weeks in Treatment: 0 Abuse/Suicide Risk Screen Items Answer ABUSE RISK SCREEN: Has anyone close to you tried to hurt or harm you recentlyo No Do you feel uncomfortable with anyone in your familyo No Has anyone forced you do things that you didnt want to doo No Electronic Signature(s) Signed: 12/22/2018 5:33:40 PM By: Cherylin Mylar Entered By: Cherylin Mylar on 12/22/2018 11:17:01 -------------------------------------------------------------------------------- Activities of Daily Living Details Patient Name: Date of Service: [name], [name] 12/22/2018 10:30 AM Medical Record [id number] Patient Account Number: [id number] Date of Birth/Sex: Treating RN: 1955-10-24 (63 y.o. Male) Cherylin Mylar Primary Care Terianne Thaker: Nicoletta Ba Other Clinician: Referring Cadie Sorci: Treating Jassiah Viviano/Extender:Robson, Lottie Rater, [name] Weeks in Treatment: 0 Activities of Daily Living Items Answer Activities of Daily Living (Please select one for each item) Drive Automobile Completely Able Take Medications Completely Able Use Telephone Completely Able Care for Appearance Completely Able Use Toilet Completely Able Bath / Shower Completely Able Dress Self Completely Able Feed Self Completely Able Walk Completely Able Get In / Out Bed Completely Able Housework Completely Able Prepare Meals Completely Able Handle Money Completely Able Shop for Self Completely Able Electronic Signature(s) Signed: 12/22/2018 5:33:40 PM By:  Cherylin Mylar Entered By: Cherylin Mylar on 12/22/2018 11:17:38 -------------------------------------------------------------------------------- Education Screening Details Patient Name: Date of Service: [name] 12/22/2018 10:30 AM Medical Record [id number] Patient Account Number: [id number] Date of Birth/Sex: Treating RN: 24-Nov-1955 (63 y.o. Male) Cherylin Mylar Primary Care Yanis Juma: Nicoletta Ba Other Clinician: Referring Sabryna Lahm: Treating Dalia Jollie/Extender:Robson, Lottie Rater, [name] Weeks in Treatment: 0 Primary Learner Assessed: Patient Learning Preferences/Education Level/Primary Language Learning Preference: Explanation Highest Education Level: College or Above Preferred Language: English Cognitive Barrier Language Barrier: No Translator Needed: No Memory Deficit: No Emotional Barrier: No Cultural/Religious Beliefs Affecting Medical Care: No Physical Barrier Impaired Vision: No Impaired Hearing: No Decreased Hand dexterity: No Knowledge/Comprehension Knowledge Level: High Comprehension Level: High Ability to understand written High instructions: Ability to understand verbal High instructions: Motivation Anxiety Level: Calm Cooperation: Cooperative Education Importance: Acknowledges Need Interest in Health Problems: Asks Questions Perception: Coherent Willingness to Engage in Self- High Management Activities: Readiness to Engage in Self- High Management Activities: Electronic Signature(s) Signed: 12/22/2018 5:33:40 PM By: Cherylin Mylar Entered By: Cherylin Mylar on 12/22/2018 11:18:26 -------------------------------------------------------------------------------- Fall Risk Assessment Details Patient Name: Date of Service: [name] 12/22/2018 10:30 AM Medical Record [id number] Patient Account Number: [id number] Date of Birth/Sex: Treating RN: January 16, 1955 (63 y.o. Male) Cherylin Mylar Primary Care Dezeray Puccio: Nicoletta Ba Other Clinician: Referring Charmeka Freeburg: Treating Estell Puccini/Extender:Robson, Lottie Rater, [name] Weeks in Treatment: 0 Fall Risk Assessment Items Have you had 2 or more falls in the last 12 monthso 0 No Have you had any fall that resulted in injury in the last 12 monthso 0 No FALLS RISK SCREEN History of falling - immediate or within 3 months 0 No Secondary diagnosis (Do you have 2 or more medical diagnoseso) 0 No Ambulatory aid None/bed rest/wheelchair/nurse 0 Yes Crutches/cane/walker 0 No Furniture 0 No Intravenous therapy Access/Saline/Heparin Lock 0 No Weak (short steps with or without shuffle, stooped but able to lift head 0 No while walking, may seek support from furniture) Impaired (short steps with shuffle, may have difficulty arising from  chair, 0 No head down, impaired balance) Mental Status Oriented to own ability 0 Yes Overestimates or forgets limitations 0 No Risk Level: Low Risk Score: 0 Electronic Signature(s) Signed: 12/22/2018 5:33:40 PM By: Kela Millin Entered By: Kela Millin on 12/22/2018 11:19:03 -------------------------------------------------------------------------------- Foot Assessment Details Patient Name: Date of Service: Joel Seals. 12/22/2018 10:30 AM Medical Record [id number] Patient Account Number: [id number] Date of Birth/Sex: Treating RN: 10/24/1955 (63 y.o. Male) Kela Millin Primary Care Detta Mellin: Shawnie Dapper Other Clinician: Referring Laiba Fuerte: Treating Meet [place]athington/Extender:Robson, [name], [name] Weeks in Treatment: 0 Foot Assessment Items Site Locations + = Sensation present, - = Sensation absent, C = Callus, U = Ulcer R = Redness, W = Warmth, M = Maceration, PU = Pre-ulcerative lesion F = Fissure, S = Swelling, D = Dryness Assessment Right: Left: Other Deformity: No No Prior Foot Ulcer: No No Prior Amputation: No No Charcot Joint: No  No Ambulatory Status: Ambulatory Without Help Gait: Steady Electronic Signature(s) Signed: 12/22/2018 5:33:40 PM By: Kela Millin Entered By: Kela Millin on 12/22/2018 11:22:23 -------------------------------------------------------------------------------- Nutrition Risk Screening Details Patient Name: Date of Service: [name], [name] 12/22/2018 10:30 AM Medical Record [id number] Patient Account Number: [id number] Date of Birth/Sex: Treating RN: January 29, 1955 (63 y.o. Male) Kela Millin Primary Care Leighanne Adolph: Shawnie Dapper Other Clinician: Referring Azzam Mehra: Treating Jhoanna Heyde/Extender:Robson, [name], [name] Weeks in Treatment: 0 Height (in): 72 Weight (lbs): 220 Body Mass Index (BMI): 29.8 Nutrition Risk Screening Items Score Screening NUTRITION RISK SCREEN: I have an illness or condition that made me change the kind and/or 0 No amount of food I eat I eat fewer than two meals per day 0 No I eat few fruits and vegetables, or milk products 0 No I have three or more drinks of beer, liquor or wine almost every day 0 No I have tooth or mouth problems that make it hard for me to eat 0 No I don't always have enough money to buy the food I need 0 No I eat alone most of the time 0 No I take three or more different prescribed or over-the-counter drugs a day 1 Yes 0 No Without wanting to, I have lost or gained 10 pounds in the last six months I am not always physically able to shop, cook and/or feed myself 0 No Nutrition Protocols Good Risk Protocol 0 No interventions needed Moderate Risk Protocol High Risk Proctocol Risk Level: Good Risk Score: 1 Electronic Signature(s) Signed: 12/22/2018 5:33:40 PM By: Kela Millin Entered By: Kela Millin on 12/22/2018 11:19:37

## 2018-12-25 LAB — AEROBIC CULTURE W GRAM STAIN (SUPERFICIAL SPECIMEN): Gram Stain: NONE SEEN

## 2018-12-26 ENCOUNTER — Other Ambulatory Visit (HOSPITAL_COMMUNITY): Payer: Self-pay | Admitting: Internal Medicine

## 2018-12-26 ENCOUNTER — Other Ambulatory Visit: Payer: Self-pay | Admitting: Internal Medicine

## 2018-12-26 DIAGNOSIS — L97519 Non-pressure chronic ulcer of other part of right foot with unspecified severity: Secondary | ICD-10-CM

## 2018-12-26 DIAGNOSIS — E11621 Type 2 diabetes mellitus with foot ulcer: Secondary | ICD-10-CM

## 2018-12-29 ENCOUNTER — Encounter (HOSPITAL_BASED_OUTPATIENT_CLINIC_OR_DEPARTMENT_OTHER): Payer: BC Managed Care – PPO | Admitting: Internal Medicine

## 2018-12-29 ENCOUNTER — Other Ambulatory Visit: Payer: Self-pay

## 2018-12-29 DIAGNOSIS — E11621 Type 2 diabetes mellitus with foot ulcer: Secondary | ICD-10-CM | POA: Diagnosis not present

## 2018-12-29 NOTE — Progress Notes (Signed)
Joel, [name] ([id number]) Visit Report for 12/29/2018 Debridement Details Patient Name: [name], [name] Date of Service: 12/29/2018 10:30 AM Medical Record [id number] Patient Account Number: [id number] Date of Birth/Sex: Treating RN: 05/10/1955 (63 y.o. M) Primary Care Provider: Nicoletta [name] Other Clinician: Referring Provider: Treating Provider/Extender:[name], Lottie [name], [name] in Treatment: 1 Debridement Performed for Wound #3 Medial Toe Great Assessment: Performed By: Physician [name]., MD Debridement Type: Debridement Severity of Tissue Pre Fat layer exposed Debridement: Level of Consciousness (Pre- Awake and Alert procedure): Pre-procedure Verification/Time Out Taken: Yes - 10:40 Start Time: 10:41 Pain Control: Lidocaine 4% Topical Solution Total Area Debrided (L x W): 0.8 (cm) x 0.6 (cm) = 0.48 (cm) Tissue and other material Viable, Non-Viable, Callus, Subcutaneous, Skin: Dermis , Fibrin/Exudate debrided: Level: Skin/Subcutaneous Tissue Debridement Description: Excisional Instrument: Curette Bleeding: Minimum Hemostasis Achieved: Pressure End Time: 10:44 Procedural Pain: 0 Post Procedural Pain: 0 Response to Treatment: Procedure was tolerated well Level of Consciousness Awake and Alert (Post-procedure): Post Debridement Measurements of Total Wound Length: (cm) 0.6 Width: (cm) 0.4 Depth: (cm) 0.2 Volume: (cm) 0.038 Character of Wound/Ulcer Post Improved Debridement: Severity of Tissue Post Debridement: Fat layer exposed Post Procedure Diagnosis Same as Pre-procedure Electronic Signature(s) Signed: 12/29/2018 12:25:31 PM By: Joel Najjar MD Entered By: [name] on 12/29/2018 10:53:51 -------------------------------------------------------------------------------- HPI Details Patient Name: Date of Service: [name] 12/29/2018 10:30 AM Medical Record [id number] Patient Account  Number: [id number] Date of Birth/Sex: Treating RN: 1955/01/12 (63 y.o. M) Primary Care Provider: Nicoletta [name] Other Clinician: Referring Provider: Treating Provider/Extender:[name], Lottie [name], [name] in Treatment: 1 History of Present Illness HPI Description: ADMISSION 12/22/2018 This is a 63 year old type II diabetic patient who was cared for in this clinic from 2012 through 2013 by Dr. Jimmey [name] for a wound in the same location on the right medial toe. I do not have records from this time however the patient states that he was treated with an advanced treatment product and a prolonged course of total contact casting. He had an MRI of the foot but is unaware whether he had osteomyelitis at that time. He states that sometime toward the beginning of September he noticed a blister in this area with surrounding callus. He saw his primary doctor he was given a course of Augmentin and was put in a surgical shoe. At one point it was felt to be healing nicely. He followed with his primary doctor on 10/19 noted large areas of callus in the wound. Area. He has not been x-rayed or cultured. He has been using topical antibiotic ointment. Past medical history includes type 2 diabetes with polyneuropathy, whitecoat hypertension and hyperlipidemia ABI in our clinic was noncompressible on the right 12/24; culture I did last time showed MRSA. I started him on doxycycline 100 twice daily for 10 days on 12/21. His MRI is scheduled for January 1. I am still going to use silver alginate to this wound and we will see him back after the MRI in 10 days Electronic Signature(s) Signed: 12/29/2018 12:25:31 PM By: Joel Najjar MD Entered By: [name] on 12/29/2018 10:56:22 -------------------------------------------------------------------------------- Physical Exam Details Patient Name: Date of Service: [name] 12/29/2018 10:30 AM Medical Record [id number] Patient  Account Number: [id number] Date of Birth/Sex: Treating RN: July 19, 1955 (63 y.o. M) Primary Care Provider: Nicoletta [name] Other Clinician: Referring Provider: Treating Provider/Extender:[name], Lottie [name], [name] in Treatment: 1 Constitutional Patient is hypertensive.. Pulse regular and within target range for patient.Marland Kitchen Respirations regular, non-labored and within target range.Marland Kitchen  Temperature is normal and within the target range for the patient.Marland Kitchen. Appears in no distress. Notes Wound exam; the area in question is on the medial part of the right great toe. This is a deep wound. Surrounding callus and thick subcutaneous tissue. Using a #3 curette debridement of the callus subcutaneous tissue from around the wound surface. Hemostasis with direct pressure. The base of the wound does not look too bad however it is precariously close to bone Electronic Signature(s) Signed: 12/29/2018 12:25:31 PM By: Joel Najjarobson, Freyja Govea MD Entered By: Joel Najjarobson, Wiley Flicker on 12/29/2018 10:56:39 -------------------------------------------------------------------------------- Physician Orders Details Patient Name: Date of Service: Joel EhlersWILLIAMS, [name] 12/29/2018 10:30 AM Medical Record [id number] Patient Account Number: [id number] Date of Birth/Sex: Treating RN: [birth year] (63 y.o. Joel SoursM) [name], Joel Primary Care Provider: Nicoletta BaMCGOWEN, Joel Other Clinician: Referring Provider: Treating Provider/Extender:Genifer Lazenby, Lottie RaterMichael MCGOWEN, [name] in Treatment: 1 Verbal / Phone Orders: No Diagnosis Coding ICD-10 Coding Code Description E11.621 Type 2 diabetes mellitus with foot ulcer L97.514 Non-pressure chronic ulcer of other part of right foot with necrosis of bone Follow-up Appointments Return Appointment in 2 [name]. - Thursday Dressing Change Frequency Change dressing every day. Wound Cleansing May shower and wash wound with soap and water. - with dressing changes. protect dressing on days not  changing. Primary Wound Dressing Wound #3 Medial Toe Great Calcium Alginate with Silver - ***important apply plain alginate to wound as primary dressing before going into have the MRI.*** Secondary Dressing Kerlix/Rolled Gauze Dry Gauze Edema Control Elevate legs to the level of the heart or above for 30 minutes daily and/or when sitting, a frequency of: - 3-4 times a day throughout the day. Other: - limit walking and standing while having the wound. Off-Loading Wound #3 Medial Toe Great Wedge shoe to: - front offloading wedge shoe use while walking and standing. Additional Orders / Instructions Follow Nutritious Diet - increase protein and vegetables within diabetic diet. closely monitor blood glucose. Electronic Signature(s) Signed: 12/29/2018 11:12:59 AM By: Shawn Stalleaton, Joel Signed: 12/29/2018 12:25:31 PM By: Joel Najjarobson, Berlinda Farve MD Entered By: Shawn Stalleaton, Joel on 12/29/2018 10:46:55 -------------------------------------------------------------------------------- Problem List Details Patient Name: Date of Service: Joel EhlersWILLIAMS, [name] 12/29/2018 10:30 AM Medical Record [id number] Patient Account Number: [id number] Date of Birth/Sex: Treating RN: [birth year] (63 y.o. 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 12/29/2018 10:30 AM Medical Record [id number] Patient Account Number: [id number] Date of Birth/Sex: Treating RN: [birth year] (63 y.o. M) Primary Care Provider: Nicoletta BaMCGOWEN, Joel Other Clinician: Referring Provider: Treating Provider/Extender:Ghali Morissette, Lottie RaterMichael MCGOWEN, [name] in Treatment: 1 Subjective History of Present Illness (HPI) ADMISSION 12/22/2018 This is a 63 year old type II diabetic patient who was cared for in this clinic from 2012 through 2013 by Dr. Jimmey RalphParker for a wound in the same location on the right medial toe. I do not have records from this time however the patient states that he was treated with an advanced treatment product and a prolonged course of total contact casting. He had an  MRI of the foot but is unaware whether he had osteomyelitis at that time. He states that sometime toward the beginning of September he noticed a blister in this area with surrounding callus. He saw his primary doctor he was given a course of Augmentin and was put in a surgical shoe. At one point it was felt to be healing nicely. He followed with his primary doctor on 10/19 noted large areas of callus in the wound. Area. He has not been x-rayed or cultured. He has been using topical antibiotic ointment. Past medical history includes type 2 diabetes with polyneuropathy, whitecoat hypertension and hyperlipidemia ABI in our clinic was noncompressible on the right 12/24; culture I did last time showed MRSA. I started him on doxycycline 100 twice daily for 10 days on 12/21. His MRI is scheduled for January 1. I am still going to use silver alginate to this wound and we will see him back after the MRI in 10 days Objective Constitutional Patient is hypertensive.. Pulse regular and within target range for patient.Marland Kitchen Respirations  regular, non-labored and within target range.. Temperature is normal and within the target range for the patient.Marland Kitchen Appears in no distress. Vitals Time Taken: 10:26 AM, Height: 72 in, Weight: 220 lbs, BMI: 29.8, Temperature: 98.3 F, Pulse: 94 bpm, Respiratory Rate: 18 breaths/min, Blood Pressure: 167/87 mmHg. General Notes: Wound exam; the area in question is on the medial part of the right great toe. This is a deep wound. Surrounding callus and thick subcutaneous tissue. Using a #3 curette debridement of the callus subcutaneous tissue from around the wound surface. Hemostasis with direct pressure. The base of the wound does not look too bad however it is precariously close to bone Integumentary (Hair, Skin) Wound #3 status is Open. Original cause of wound was Blister. The wound is located on the Medial Toe Great. The wound measures 0.6cm length x 0.4cm width x 0.2cm depth; 0.188cm^2 area and 0.038cm^3 volume. There is bone and Fat Layer (Subcutaneous Tissue) Exposed exposed. There is no tunneling noted, however, there is undermining starting at 1:00 and ending at 6:00 with a maximum distance of 0.3cm. There is a medium amount of serosanguineous drainage noted. The wound margin is well defined and not attached to the wound base. There is medium (34-66%) pink granulation within the wound bed. There is a medium (34-66%) amount of necrotic tissue within the wound bed including Adherent Slough. Assessment Active Problems ICD-10 Type 2 diabetes mellitus with foot ulcer Non-pressure chronic ulcer of other part of right foot with necrosis of bone Methicillin resistant Staphylococcus aureus infection, unspecified site Procedures Wound #3 Pre-procedure diagnosis of Wound #3 is a Diabetic Wound/Ulcer of the Lower Extremity located on the Medial Toe Great .Severity of Tissue Pre Debridement is: Fat layer exposed. There was a Excisional Skin/Subcutaneous Tissue Debridement with a total area of 0.48 sq  cm performed by [name]., MD. With the following instrument(s): Curette to remove Viable and Non-Viable tissue/material. Material removed includes Callus, Subcutaneous Tissue, Skin: Dermis, and Fibrin/Exudate after achieving pain control using Lidocaine 4% Topical Solution. A time out was conducted at 10:40, prior to the start of the procedure. A Minimum amount of bleeding was controlled with Pressure. The procedure was tolerated well with a pain level of 0 throughout and a pain level of 0 following the procedure. Post Debridement Measurements: 0.6cm length x 0.4cm width x 0.2cm depth; 0.038cm^3 volume. Character of Wound/Ulcer Post Debridement is improved. Severity of Tissue Post Debridement is: Fat layer exposed.  Post procedure Diagnosis Wound #3: Same as Pre-Procedure Plan Follow-up Appointments: Return Appointment in 2 [name]. - Thursday Dressing Change Frequency: Change dressing every day. Wound Cleansing: May shower and wash wound with soap and water. - with dressing changes. protect dressing on days not changing. Primary Wound Dressing: Wound #3 Medial Toe Great: Calcium Alginate with Silver - ***important apply plain alginate to wound as primary dressing before going into have the MRI.*** Secondary Dressing: Kerlix/Rolled Gauze Dry Gauze Edema Control: Elevate legs to the level of the heart or above for 30 minutes daily and/or when sitting, a frequency of: - 3-4 times a day throughout the day. Other: - limit walking and standing while having the wound. Off-Loading: Wound #3 Medial Toe Great: Wedge shoe to: - front offloading wedge shoe use while walking and standing. Additional Orders / Instructions: Follow Nutritious Diet - increase protein and vegetables within diabetic diet. closely monitor blood glucose. 1. I am continuing with silver alginate 2. I have asked him to offload this carefully 3. Doxycycline as noted 4. MRI for January 1. 5. High likelihood of  osteomyelitis Electronic Signature(s) Signed: 12/29/2018 12:25:31 PM By: Linton Ham MD Entered By: Linton Ham on 12/29/2018 10:58:07 -------------------------------------------------------------------------------- SuperBill Details Patient Name: Date of Service: [name] 12/29/2018 Medical Record [id number] Patient Account Number: [id number] Date of Birth/Sex: Treating RN: July 30, 1955 (63 y.o. Hessie Diener Primary Care Provider: Shawnie Dapper Other Clinician: Referring Provider: Treating Provider/Extender:Breean Nannini, Peyton [name], [name] in Treatment: 1 Diagnosis Coding ICD-10 Codes Code Description E11.621 Type 2 diabetes mellitus with foot ulcer L97.514 Non-pressure chronic ulcer of other part of right foot with necrosis of bone Facility Procedures CPT4 Code Description: [id number] - DEB SUBQ TISSUE 20 SQ CM/< ICD-10 Diagnosis Description L97.514 Non-pressure chronic ulcer of other part of right foot wit E11.621 Type 2 diabetes mellitus with foot ulcer Modifier: h necrosis of b Quantity: 1 one Physician Procedures Electronic Signature(s) Signed: 12/29/2018 12:25:31 PM By: Linton Ham MD Entered By: Linton Ham on 12/29/2018 10:58:16

## 2019-01-02 NOTE — Progress Notes (Addendum)
[name], Alexzavier J. ([id number]) Visit Report for 12/29/2018 Arrival Information Details Patient Name: Date of Service: [name], [name] 12/29/2018 10:30 AM Medical Record [id number] Patient Account Number: [id number] Date of Birth/Sex: Treating RN: Jul 10, 1955 (63 y.o. Judie PetitM) Yevonne PaxEpps, Carrie Primary Care Nirvaan Frett: Nicoletta BaMCGOWEN, [name] Other Clinician: Referring Darrien Laakso: Treating Francella Barnett/Extender:Robson, Lottie RaterMichael MCGOWEN, [name] Weeks in Treatment: 1 Visit Information History Since Last Visit All ordered tests and consults were completed: No Patient Arrived: Ambulatory Added or deleted any medications: No Arrival Time: 10:25 Any new allergies or adverse reactions: No Accompanied By: self Had a fall or experienced change in No Transfer Assistance: None activities of daily living that may affect Patient Identification Verified: Yes risk of falls: Secondary Verification Process Yes Signs or symptoms of abuse/neglect since last No Completed: visito Patient Requires Transmission- No Hospitalized since last visit: No Based Precautions: Implantable device outside of the clinic excluding No Patient Has Alerts: Yes cellular tissue based products placed in the center Patient Alerts: Right ABI: non since last visit: compress Has Dressing in Place as Prescribed: Yes Pain Present Now: No Electronic Signature(s) Signed: 12/29/2018 11:24:03 AM By: Yevonne PaxEpps, Carrie RN Entered By: Yevonne PaxEpps, Carrie on 12/29/2018 10:26:21 -------------------------------------------------------------------------------- Encounter Discharge Information Details Patient Name: Date of Service: [name], [name] 12/29/2018 10:30 AM Medical Record [id number] Patient Account Number: [id number] Date of Birth/Sex: Treating RN: Jul 10, 1955 (63 y.o. Katherina RightM) Dwiggins, Shannon Primary Care Stefania Goulart: Nicoletta BaMCGOWEN, [name] Other Clinician: Referring Muntaha Vermette: Treating Faustine Tates/Extender:Robson, Lottie RaterMichael MCGOWEN, [name] Weeks  in Treatment: 1 Encounter Discharge Information Items Post Procedure Vitals Discharge Condition: Stable Temperature (F): 98.3 Ambulatory Status: Ambulatory Pulse (bpm): 94 Discharge Destination: Home Respiratory Rate (breaths/min): 18 Transportation: Private Auto Blood Pressure (mmHg): 167/87 Accompanied By: self Schedule Follow-up Appointment: Yes Clinical Summary of Care: Patient Declined Electronic Signature(s) Signed: 01/02/2019 5:46:04 PM By: Cherylin Mylarwiggins, Shannon Entered By: Cherylin Mylarwiggins, Shannon on 12/29/2018 10:56:26 -------------------------------------------------------------------------------- Lower Extremity Assessment Details Patient Name: Date of Service: [name], [name] 12/29/2018 10:30 AM Medical Record [id number] Patient Account Number: [id number] Date of Birth/Sex: Treating RN: Jul 10, 1955 (63 y.o. Judie PetitM) Yevonne PaxEpps, Carrie Primary Care Mishka Stegemann: Nicoletta BaMCGOWEN, [name] Other Clinician: Referring Anneli Bing: Treating Izabel Chim/Extender:Robson, Lottie RaterMichael MCGOWEN, [name] Weeks in Treatment: 1 Edema Assessment Assessed: [Left: No] [Right: No] E[Left: dema] [Right: :] Calf Left: Right: Point of Measurement: 35 cm From Medial Instep cm 41 cm Ankle Left: Right: Point of Measurement: 7 cm From Medial Instep cm 26 cm Electronic Signature(s) Signed: 12/29/2018 11:24:03 AM By: Yevonne PaxEpps, Carrie RN Entered By: Yevonne PaxEpps, Carrie on 12/29/2018 10:33:28 -------------------------------------------------------------------------------- Multi Wound Chart Details Patient Name: Date of Service: [name], [name] 12/29/2018 10:30 AM Medical Record [id number] Patient Account Number: [id number] Date of Birth/Sex: Treating RN: Jul 10, 1955 (63 y.o. 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] Comorbid History: [3:Hypertension, Type II Diabetes, Neuropathy] [N/A:N/A] Date Acquired: [3:09/06/2018] [N/A:N/A] Weeks of Treatment: [3:1] [N/A:N/A] Wound Status: [3:Open] [N/A:N/A] Measurements L x W x D 0.6x0.4x0.2 [N/A:N/A] (cm) Area (cm) : [3:0.188] [N/A:N/A] Volume (cm) : [3:0.038] [N/A:N/A] % Reduction in Area: [3:78.60%] [N/A:N/A] % Reduction in Volume: 97.50% [N/A:N/A] Starting Position 1 [3:1] (o'clock): Ending Position 1 [3:6] (o'clock): Maximum Distance 1 [3:0.3] (cm): Undermining: [3:Yes] [N/A:N/A] Classification: [3:Grade 2] [N/A:N/A] Exudate Amount: [3:Medium] [N/A:N/A] Exudate Type: [3:Serosanguineous] [  N/A:N/A] Exudate Color: [3:red, brown] [N/A:N/A] Wound Margin: [3:Well defined, not attached N/A] Granulation Amount: [3:Medium (34-66%)] [N/A:N/A] Granulation Quality: [3:Pink] [N/A:N/A] Necrotic Amount: [3:Medium (34-66%)] [N/A:N/A] Exposed Structures: [3:Fat Layer (Subcutaneous N/A Tissue) Exposed: Yes Bone: Yes Fascia: No Tendon: No Muscle: No Joint: No] Epithelialization: [3:None] [N/A:N/A] Debridement: [3:Debridement - Excisional N/A] Pre-procedure [3:10:40] [N/A:N/A] Verification/Time Out Taken: Pain Control: [3:Lidocaine 4% Topical Solution] [N/A:N/A] Tissue Debrided: [3:Callus, Subcutaneous] [N/A:N/A] Level: [3:Skin/Subcutaneous Tissue N/A] Debridement Area (sq cm):0.48 [N/A:N/A] Instrument: [3:Curette] [N/A:N/A] Bleeding: [3:Minimum] [N/A:N/A] Hemostasis Achieved: [3:Pressure] [N/A:N/A] Procedural Pain: [3:0] [N/A:N/A] Post Procedural Pain: [3:0] [N/A:N/A] Debridement Treatment [3:Procedure was tolerated] [N/A:N/A] Response: [3:well] Post Debridement [3:0.6x0.4x0.2]  [N/A:N/A] Measurements L x W x D (cm) Post Debridement [3:0.038] [N/A:N/A] Volume: (cm) Procedures Performed: [3:Debridement] [N/A:N/A] Treatment Notes Electronic Signature(s) Signed: 12/29/2018 12:25:31 PM By: Baltazar Najjar MD Entered By: Baltazar Najjar on 12/29/2018 10:53:40 -------------------------------------------------------------------------------- Multi-Disciplinary Care Plan Details Patient Name: Date of Service: Joel Ehlers. 12/29/2018 10:30 AM Medical Record [id number] Patient Account Number: [id number] Date of Birth/Sex: Treating RN: 1955/11/06 (63 y.o. [name] Primary Care Dwan Hemmelgarn: Nicoletta Ba Other Clinician: Referring Kaily Wragg: Treating Felecity Lemaster/Extender:Robson, Lottie Rater, [name] Weeks in Treatment: 1 Active Inactive Abuse / Safety / Falls / Self Care Management Nursing Diagnoses: Potential for falls Goals: Patient will remain injury free related to falls Date Initiated: 12/22/2018 Target Resolution Date: 01/19/2019 Goal Status: Active Interventions: Assess fall risk on admission and as needed Assess self care needs on admission and as needed Provide education on fall prevention Notes: Nutrition Nursing Diagnoses: Potential for alteratiion in Nutrition/Potential for imbalanced nutrition Goals: Patient/caregiver verbalizes understanding of need to maintain therapeutic glucose control per primary care physician Date Initiated: 12/22/2018 Target Resolution Date: 01/13/2019 Goal Status: Active Interventions: Assess HgA1c results as ordered upon admission and as needed Provide education on elevated blood sugars and impact on wound healing Provide education on nutrition Treatment Activities: Obtain HgA1c : 12/22/2018 Patient referred to Primary Care Physician for further nutritional evaluation : 12/22/2018 Notes: Wound/Skin Impairment Nursing Diagnoses: Knowledge deficit related to ulceration/compromised skin  integrity Goals: Patient/caregiver will verbalize understanding of skin care regimen Date Initiated: 12/22/2018 Target Resolution Date: 01/13/2019 Goal Status: Active Interventions: Assess patient/caregiver ability to obtain necessary supplies Assess patient/caregiver ability to perform ulcer/skin care regimen upon admission and as needed Assess ulceration(s) every visit Provide education on ulcer and skin care Treatment Activities: Skin care regimen initiated : 12/22/2018 Topical wound management initiated : 12/22/2018 Notes: Electronic Signature(s) Signed: 12/29/2018 11:12:59 AM By: Shawn Stall Entered By: Shawn Stall on 12/29/2018 10:39:10 -------------------------------------------------------------------------------- Pain Assessment Details Patient Name: Date of Service: [name], [name] 12/29/2018 10:30 AM Medical Record [id number] Patient Account Number: [id number] Date of Birth/Sex: Treating RN: [birth year] (63 y.o. Judie Petit) Yevonne Pax Primary Care Riah Kehoe: Nicoletta Ba Other Clinician: Referring Kilyn Maragh: Treating Nani Ingram/Extender:Robson, Lottie Rater, [name] Weeks in Treatment: 1 Active Problems Location of Pain Severity and Description of Pain Patient Has Paino No Site Locations Pain Management and Medication Current Pain Management: Electronic Signature(s) Signed: 12/29/2018 11:24:03 AM By: Yevonne Pax RN Entered By: Yevonne Pax on 12/29/2018 10:28:21 -------------------------------------------------------------------------------- Patient/Caregiver Education Details [name] 12/24/2020andnbsp10:30 Patient Name: Date of Service: J. AM Medical Record Patient Account Number: [id number] Number: Treating RN: Shawn Stall Date of Birth/Gender: 05/06/1955 (63 y.o. M) Other Clinician: Primary Care Treating MCGOWEN, [name] Baltazar Najjar Physician: Physician/Extender: Referring Physician: Santo Held in  Treatment: 1 Education Assessment Education Provided To: Patient Education Topics Provided Wound/Skin Impairment: Handouts: Caring for Your Ulcer Methods: Explain/Verbal Responses: Reinforcements needed  Electronic Signature(s) Signed: 12/29/2018 11:12:59 AM By: Deon Pilling Entered By: Deon Pilling on 12/29/2018 10:39:23 -------------------------------------------------------------------------------- Wound Assessment Details Patient Name: Date of Service: [name], [name] 12/29/2018 10:30 AM Medical Record [id number] Patient Account Number: [id number] Date of Birth/Sex: Treating RN: 1955-01-24 (63 y.o. Jerilynn Mages) Carlene Coria Primary Care Iyah Laguna: Shawnie Dapper Other Clinician: Referring Terrius Gentile: Treating Finnlee Silvernail/Extender:Robson, [name], [name] Weeks in Treatment: 1 Wound Status Wound Number: 3 Primary Diabetic Wound/Ulcer of the Lower Etiology: Extremity Wound Location: Toe Great - Medial Wound Status: Open Wounding Event: Blister Comorbid Hypertension, Type II Diabetes, Date Acquired: 09/06/2018 History: Neuropathy Weeks Of Treatment: 1 Clustered Wound: No Photos Wound Measurements Length: (cm) 0.6 % Reduction in A Width: (cm) 0.4 % Reduction in V Depth: (cm) 0.2 Epithelializatio Area: (cm) 0.188 Tunneling: Volume: (cm) 0.038 Undermining: Starting Posi Ending Positi Maximum Dista rea: 78.6% olume: 97.5% n: None No Yes tion (o'clock): 1 on (o'clock): 6 nce: (cm) 0.3 Wound Description Classification: Grade 2 Foul Odor After Wound Margin: Well defined, not attached Slough/Fibrino Exudate Amount: Medium Exudate Type: Serosanguineous Exudate Color: red, brown Wound Bed Granulation Amount: Medium (34-66%) Granulation Quality: Pink Fascia Exposed: Necrotic Amount: Medium (34-66%) Fat Layer (Subcu Necrotic Quality: Adherent Slough Tendon Exposed: Muscle Exposed: Joint Exp Bone Expo Cleansing: No Yes Exposed Structure No taneous  Tissue) Exposed: Yes No No osed: No sed: Yes Treatment Notes Wound #3 (Medial Toe Great) 1. Cleanse With Wound Cleanser 2. Periwound Care Skin Prep 3. Primary Dressing Applied Calcium Alginate Ag 4. Secondary Dressing Dry Gauze Roll Gauze Notes netting Electronic Signature(s) Signed: 01/03/2019 3:43:24 PM By: Joel Hawthorne EMT/HBOT Signed: 01/03/2019 5:43:27 PM By: Carlene Coria RN Previous Signature: 12/29/2018 11:24:03 AM Version By: Carlene Coria RN Entered By: Joel Hawthorne on 01/03/2019 14:06:26 -------------------------------------------------------------------------------- Vitals Details Patient Name: Date of Service: [name]. 12/29/2018 10:30 AM Medical Record [id number] Patient Account Number: [id number] Date of Birth/Sex: Treating RN: 10-24-1955 (63 y.o. Jerilynn Mages) Epps, Whittlesey Primary Care Shaquoya Cosper: Shawnie Dapper Other Clinician: Referring Caren Garske: Treating Areg Bialas/Extender:Robson, [name], [name] Weeks in Treatment: 1 Vital Signs Time Taken: 10:26 Temperature (F): 98.3 Height (in): 72 Pulse (bpm): 94 Weight (lbs): 220 Respiratory Rate (breaths/min): 18 Body Mass Index (BMI): 29.8 Blood Pressure (mmHg): 167/87 Reference Range: 80 - 120 mg / dl Electronic Signature(s) Signed: 12/29/2018 11:24:03 AM By: Carlene Coria RN Entered By: Carlene Coria on 12/29/2018 10:28:14

## 2019-01-07 ENCOUNTER — Ambulatory Visit (HOSPITAL_COMMUNITY): Admission: RE | Admit: 2019-01-07 | Payer: BC Managed Care – PPO | Source: Ambulatory Visit

## 2019-01-07 ENCOUNTER — Encounter (HOSPITAL_COMMUNITY): Payer: Self-pay

## 2019-01-12 ENCOUNTER — Ambulatory Visit (HOSPITAL_COMMUNITY)
Admission: RE | Admit: 2019-01-12 | Discharge: 2019-01-12 | Disposition: A | Discharge status: Home / Self Care | Payer: BC Managed Care – PPO | Source: Ambulatory Visit | Attending: Internal Medicine | Admitting: Internal Medicine

## 2019-01-12 ENCOUNTER — Other Ambulatory Visit: Payer: Self-pay

## 2019-01-12 ENCOUNTER — Encounter (HOSPITAL_BASED_OUTPATIENT_CLINIC_OR_DEPARTMENT_OTHER): Payer: BC Managed Care – PPO | Attending: Internal Medicine | Admitting: Internal Medicine

## 2019-01-12 ENCOUNTER — Other Ambulatory Visit (HOSPITAL_COMMUNITY): Payer: Self-pay | Admitting: Internal Medicine

## 2019-01-12 ENCOUNTER — Encounter (HOSPITAL_COMMUNITY): Payer: Self-pay

## 2019-01-12 DIAGNOSIS — L97519 Non-pressure chronic ulcer of other part of right foot with unspecified severity: Secondary | ICD-10-CM

## 2019-01-12 DIAGNOSIS — I10 Essential (primary) hypertension: Secondary | ICD-10-CM | POA: Insufficient documentation

## 2019-01-12 DIAGNOSIS — E11621 Type 2 diabetes mellitus with foot ulcer: Secondary | ICD-10-CM | POA: Diagnosis present

## 2019-01-12 DIAGNOSIS — E785 Hyperlipidemia, unspecified: Secondary | ICD-10-CM | POA: Insufficient documentation

## 2019-01-12 DIAGNOSIS — L97514 Non-pressure chronic ulcer of other part of right foot with necrosis of bone: Secondary | ICD-10-CM | POA: Insufficient documentation

## 2019-01-12 DIAGNOSIS — B9562 Methicillin resistant Staphylococcus aureus infection as the cause of diseases classified elsewhere: Secondary | ICD-10-CM | POA: Diagnosis not present

## 2019-01-12 DIAGNOSIS — E1140 Type 2 diabetes mellitus with diabetic neuropathy, unspecified: Secondary | ICD-10-CM | POA: Diagnosis not present

## 2019-01-12 DIAGNOSIS — E13621 Other specified diabetes mellitus with foot ulcer: Secondary | ICD-10-CM | POA: Insufficient documentation

## 2019-01-12 DIAGNOSIS — E119 Type 2 diabetes mellitus without complications: Secondary | ICD-10-CM | POA: Diagnosis not present

## 2019-01-12 MED ORDER — GADOBUTROL 1 MMOL/ML IV SOLN
10.0000 mL | Freq: Once | INTRAVENOUS | Status: AC | PRN
  Administered 2019-01-12: 10 mL via INTRAVENOUS

## 2019-01-13 NOTE — Progress Notes (Signed)
[name] ([id number]) Visit Report for 01/12/2019 Debridement Details Patient Name: [name], [name] Date of Service: 01/12/2019 9:45 AM Medical Record [id number] Patient Account Number: [id number] Date of Birth/Sex: Treating RN: 1955-06-24 (64 y.o. M) Primary Care Provider: Shawnie Dapper Other Clinician: Referring Provider: Treating Provider/Extender:Caralynn Gelber, [name], [name] Weeks in Treatment: 3 Debridement Performed for Wound #3 Medial Toe Great Assessment: Performed By: Physician Ricard Dillon., MD Debridement Type: Debridement Severity of Tissue Pre Fat layer exposed Debridement: Level of Consciousness (Pre- Awake and Alert procedure): Pre-procedure Verification/Time Out Taken: Yes - 11:15 Start Time: 11:16 Pain Control: Lidocaine 4% Topical Solution Total Area Debrided (L x W): 1 (cm) x 1 (cm) = 1 (cm) Tissue and other material Viable, Non-Viable, Callus, Subcutaneous, Skin: Dermis , Fibrin/Exudate debrided: Level: Skin/Subcutaneous Tissue Debridement Description: Excisional Instrument: Curette Bleeding: Moderate Hemostasis Achieved: Pressure End Time: 11:20 Procedural Pain: 0 Post Procedural Pain: 0 Response to Treatment: Procedure was tolerated well Level of Consciousness Awake and Alert (Post-procedure): Post Debridement Measurements of Total Wound Length: (cm) 0.6 Width: (cm) 0.5 Depth: (cm) 0.3 Volume: (cm) 0.071 Character of Wound/Ulcer Post Improved Debridement: Severity of Tissue Post Debridement: Fat layer exposed Post Procedure Diagnosis Same as Pre-procedure Electronic Signature(s) Signed: 01/13/2019 4:55:01 AM By: Linton Ham MD Entered By: Linton Ham on 01/12/2019 11:31:13 -------------------------------------------------------------------------------- HPI Details Patient Name: Date of Service: [name] 01/12/2019 9:45 AM Medical Record [id number] Patient Account Number:  [id number] Date of Birth/Sex: Treating RN: 1955-10-03 (64 y.o. M) Primary Care Provider: Shawnie Dapper Other Clinician: Referring Provider: Treating Provider/Extender:Shaya Altamura, [name], [name] Weeks in Treatment: 3 History of Present Illness HPI Description: ADMISSION 12/22/2018 This is a 64 year old type II diabetic patient who was cared for in this clinic from 2012 through 2013 by Dr. Jerline Pain for a wound in the same location on the right medial toe. I do not have records from this time however the patient states that he was treated with an advanced treatment product and a prolonged course of total contact casting. He had an MRI of the foot but is unaware whether he had osteomyelitis at that time. He states that sometime toward the beginning of September he noticed a blister in this area with surrounding callus. He saw his primary doctor he was given a course of Augmentin and was put in a surgical shoe. At one point it was felt to be healing nicely. He followed with his primary doctor on 10/19 noted large areas of callus in the wound. Area. He has not been x-rayed or cultured. He has been using topical antibiotic ointment. Past medical history includes type 2 diabetes with polyneuropathy, whitecoat hypertension and hyperlipidemia ABI in our clinic was noncompressible on the right 12/24; culture I did last time showed MRSA. I started him on doxycycline 100 twice daily for 10 days on 12/21. His MRI is scheduled for January 1. I am still going to use silver alginate to this wound and we will see him back after the MRI in 10 days 01/12/2019 patient arrives with considerable amount of callus over the wound. his MRI is not done because of issues with the creatinine although this was normal in October. He is using a [employer], [employer]) Signed: 01/13/2019 4:55:01 AM By: Linton Ham MD Entered By: Linton Ham on 01/12/2019  11:32:14 -------------------------------------------------------------------------------- Physical Exam Details Patient Name: Date of Service: [name] 01/12/2019 9:45 AM Medical Record [id number] Patient Account Number: [id number] Date of Birth/Sex: Treating RN: August 02, 1955 (64 y.o. M) Primary Care  Provider: Nicoletta Ba Other Clinician: Referring Provider: Treating Provider/Extender:Zennie Ayars, Lottie Rater, [name] Weeks in Treatment: 3 Constitutional Patient is hypertensive.. Pulse regular and within target range for patient.Marland Kitchen Respirations regular, non-labored and within target range.. Temperature is normal and within the target range for the patient.Marland Kitchen Appears in no distress. Notes Wound exam; the area in question is on the medial part of the right great toe. He comes in the clinic today with a very marked amount of thick callus over the wound surface. I remove this with a #5 curette along with some subcutaneous tissue. Actually the wound after debridement looks quite a bit better. This was initially a deep wound I think this is come in quite a bit. Hemostasis with direct pressure. There is no surrounding infection Electronic Signature(s) Signed: 01/13/2019 4:55:01 AM By: Baltazar Najjar MD Entered By: Baltazar Najjar on 01/12/2019 11:33:08 -------------------------------------------------------------------------------- Physician Orders Details Patient Name: Date of Service: [name] 01/12/2019 9:45 AM Medical Record [id number] Patient Account Number: [id number] Date of Birth/Sex: Treating RN: [birth year] (64 y.o. [name] Primary Care Provider: Nicoletta Ba Other Clinician: Referring Provider: Treating Provider/Extender:Aiyden Lauderback, Lottie Rater, [name] Weeks in Treatment: 3 Verbal / Phone Orders: No Diagnosis Coding ICD-10 Coding Code Description E11.621 Type 2 diabetes mellitus with foot ulcer L97.514 Non-pressure chronic ulcer  of other part of right foot with necrosis of bone A49.02 Methicillin resistant Staphylococcus aureus infection, unspecified site Follow-up Appointments Return Appointment in 1 week. - Thursday Dressing Change Frequency Change dressing every day. Wound Cleansing May shower and wash wound with soap and water. - with dressing changes. protect dressing on days not changing. Primary Wound Dressing Wound #3 Medial Toe Great Silver Collagen - ***apply plain alginate into wound before MRI.*** Secondary Dressing Kerlix/Rolled Gauze Dry Gauze Edema Control Elevate legs to the level of the heart or above for 30 minutes daily and/or when sitting, a frequency of: - 3-4 times a day throughout the day. Other: - limit walking and standing while having the wound. Off-Loading Wound #3 Medial Toe Great Wedge shoe to: - front offloading wedge shoe use while walking and standing. Additional Orders / Instructions Follow Nutritious Diet - increase protein and vegetables within diabetic diet. closely monitor blood glucose. Electronic Signature(s) Signed: 01/12/2019 5:19:45 PM By: Shawn Stall Signed: 01/13/2019 4:55:01 AM By: Baltazar Najjar MD Entered By: Shawn Stall on 01/12/2019 11:27:11 -------------------------------------------------------------------------------- Problem List Details Patient Name: Date of Service: HOWARD, BUNTE 01/12/2019 9:45 AM Medical Record [id number] Patient Account Number: [id number] Date of Birth/Sex: Treating RN: 1955-05-12 (64 y.o. 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 M) Primary Care Provider: Nicoletta Ba Other Clinician: Referring Provider: Treating Provider/Extender:Nayeli Calvert, Lottie Rater, [name] Weeks in Treatment: 3 Subjective History of Present Illness (HPI) ADMISSION 12/22/2018 This is a 64 year old type II diabetic patient who was cared for in this clinic from 2012 through  2013 by Dr. Jimmey Ralph for a wound in the same location on the right medial toe. I do not have records from this time however the patient states that he was treated with an advanced treatment product and a prolonged course of total contact casting. He had an MRI of the foot but is unaware whether he had osteomyelitis at that time. He states that sometime toward the beginning of September he noticed a blister in this area with surrounding callus. He saw his primary doctor he was given a course of Augmentin and was put in a surgical shoe. At one point it was felt to be healing nicely. He followed with his primary doctor on 10/19 noted large areas of callus in the wound. Area. He has not been x-rayed or cultured. He has been using topical antibiotic ointment. Past medical history includes type 2 diabetes with polyneuropathy, whitecoat hypertension and hyperlipidemia ABI in our clinic was noncompressible on the right 12/24; culture I did last time showed MRSA. I started him on doxycycline 100 twice daily for 10 days on 12/21. His MRI is  scheduled for January 1. I am still going to use silver alginate to this wound and we will see him back after the MRI in 10 days 01/12/2019 patient arrives with considerable amount of callus over the wound. his MRI is not done because of issues with the creatinine although this was normal in October. He is using a [employer] forefoot offloading Objective Constitutional Patient is hypertensive.. Pulse regular and within target range for patient.Marland Kitchen Respirations regular, non-labored and within target range.. Temperature is normal and within the target range for the patient.Marland Kitchen Appears in no distress. Vitals Time Taken: 10:25 AM, Height: 72 in, Weight: 220 lbs, BMI: 29.8, Temperature: 98.2 F, Pulse: 90 bpm, Respiratory Rate: 18 breaths/min, Blood Pressure: 179/90 mmHg. General Notes: Wound exam; the area in question is on the medial part of the right great toe. He comes in the clinic today with a very marked amount of thick callus over the wound surface. I remove this with a #5 curette along with some subcutaneous tissue. Actually the wound after debridement looks quite a bit better. This was initially a deep wound I think this is come in quite a bit. Hemostasis with direct pressure. There is no surrounding infection Integumentary (Hair, Skin) Wound #3 status is Open. Original cause of wound was Blister. The wound is located on the Medial Toe Great. The wound measures 0.6cm length x 0.5cm width x 0.3cm depth; 0.236cm^2 area and 0.071cm^3 volume. There is no tunneling or undermining noted. There is a none present amount of drainage noted. The wound margin is well defined and not attached to the wound base. There is no granulation within the wound bed. There is a medium (34- 66%) amount of necrotic tissue within the wound bed including Eschar and Adherent Slough. Assessment Active Problems ICD-10 Type 2 diabetes mellitus with foot ulcer Non-pressure chronic ulcer of other part of right foot with necrosis  of bone Methicillin resistant Staphylococcus aureus infection, unspecified site Procedures Wound #3 Pre-procedure diagnosis of Wound #3 is a Diabetic Wound/Ulcer of the Lower Extremity located on the Medial Toe Great .Severity of Tissue Pre Debridement is: Fat layer exposed. There was a Excisional Skin/Subcutaneous Tissue Debridement with a total area of 1 sq cm performed by Maxwell Caul., MD. With the following instrument(s): Curette to remove Viable and Non-Viable tissue/material. Material removed includes Callus, Subcutaneous Tissue, Skin: Dermis, and Fibrin/Exudate after achieving pain control using Lidocaine  4% Topical Solution. A time out was conducted at 11:15, prior to the start of the procedure. A Moderate amount of bleeding was controlled with Pressure. The procedure was tolerated well with a pain level of 0 throughout and a pain level of 0 following the procedure. Post Debridement Measurements: 0.6cm length x 0.5cm width x 0.3cm depth; 0.071cm^3 volume. Character of Wound/Ulcer Post Debridement is improved. Severity of Tissue Post Debridement is: Fat layer exposed. Post procedure Diagnosis Wound #3: Same as Pre-Procedure Plan Follow-up Appointments: Return Appointment in 1 week. - Thursday Dressing Change Frequency: Change dressing every day. Wound Cleansing: May shower and wash wound with soap and water. - with dressing changes. protect dressing on days not changing. Primary Wound Dressing: Wound #3 Medial Toe Great: Silver Collagen - ***apply plain alginate into wound before MRI.*** Secondary Dressing: Kerlix/Rolled Gauze Dry Gauze Edema Control: Elevate legs to the level of the heart or above for 30 minutes daily and/or when sitting, a frequency of: - 3-4 times a day throughout the day. Other: - limit walking and standing while having the wound. Off-Loading: Wound #3 Medial Toe Great: Wedge shoe to: - front offloading wedge shoe use while walking and  standing. Additional Orders / Instructions: Follow Nutritious Diet - increase protein and vegetables within diabetic diet. closely monitor blood glucose. 1. We use silver collagen 2. This is to replace alginate. 3. Reasonably aggressive debridement today I think the wound actually is filled in somewhat. 4. MRI tonight at 7:00 Electronic Signature(s) Signed: 01/13/2019 4:55:01 AM By: Baltazar Najjar MD Entered By: Baltazar Najjar on 01/12/2019 11:33:53 -------------------------------------------------------------------------------- SuperBill Details Patient Name: Date of Service: [name] 01/12/2019 Medical Record [id number] Patient Account Number: [id number] Date of Birth/Sex: Treating RN: November 27, 1955 (63 y.o. [name] Primary Care Provider: Nicoletta Ba Other Clinician: Referring Provider: Treating Provider/Extender:Farrah Skoda, Lottie Rater, [name] Weeks in Treatment: 3 Diagnosis Coding ICD-10 Codes Code Description E11.621 Type 2 diabetes mellitus with foot ulcer L97.514 Non-pressure chronic ulcer of other part of right foot with necrosis of bone A49.02 Methicillin resistant Staphylococcus aureus infection, unspecified site Facility Procedures CPT4 Code Description: [id number] - DEB SUBQ TISSUE 20 SQ CM/< ICD-10 Diagnosis Description E11.621 Type 2 diabetes mellitus with foot ulcer L97.514 Non-pressure chronic ulcer of other part of right foot with Modifier: necrosis of bo Quantity: 1 ne Physician Procedures Electronic Signature(s) Signed: 01/13/2019 4:55:01 AM By: Baltazar Najjar MD Entered By: Baltazar Najjar on 01/12/2019 11:34:07

## 2019-01-19 ENCOUNTER — Encounter (HOSPITAL_BASED_OUTPATIENT_CLINIC_OR_DEPARTMENT_OTHER): Payer: BC Managed Care – PPO | Admitting: Internal Medicine

## 2019-01-19 ENCOUNTER — Other Ambulatory Visit: Payer: Self-pay

## 2019-01-19 DIAGNOSIS — E11621 Type 2 diabetes mellitus with foot ulcer: Secondary | ICD-10-CM | POA: Diagnosis not present

## 2019-01-19 NOTE — Progress Notes (Signed)
Joel, [name] ([id number]) Visit Report for 01/19/2019 HPI Details Patient Name: Date of Service: [name], [name] 01/19/2019 9:45 AM Medical Record [id number] Patient Account Number: [id number] Date of Birth/Sex: Treating RN: 1955/03/19 (64 y.o. M) Primary Care Provider: Nicoletta [name] Other Clinician: Referring Provider: Treating Provider/Extender:[name], [name], [name] in Treatment: 4 History of Present Illness HPI Description: ADMISSION 12/22/2018 This is a 64 year old type II diabetic patient who was cared for in this clinic from 2012 through 2013 by Dr. Jimmey [name] for a wound in the same location on the right medial toe. I do not have records from this time however the patient states that he was treated with an advanced treatment product and a prolonged course of total contact casting. He had an MRI of the foot but is unaware whether he had osteomyelitis at that time. He states that sometime toward the beginning of September he noticed a blister in this area with surrounding callus. He saw his primary doctor he was given a course of Augmentin and was put in a surgical shoe. At one point it was felt to be healing nicely. He followed with his primary doctor on 10/19 noted large areas of callus in the wound. Area. He has not been x-rayed or cultured. He has been using topical antibiotic ointment. Past medical history includes type 2 diabetes with polyneuropathy, whitecoat hypertension and hyperlipidemia ABI in our clinic was noncompressible on the right 12/24; culture I did last time showed MRSA. I started him on doxycycline 100 twice daily for 10 days on 12/21. His MRI is scheduled for January 1. I am still going to use silver alginate to this wound and we will see him back after the MRI in 10 days 01/12/2019 patient arrives with considerable amount of callus over the wound. his MRI is not done because of issues with the creatinine although this was normal  in October. He is using a Darco forefoot offloading 1/14; his MRI showed a small plantar ulceration at the medial base of the great toe with adjacent soft tissue enhancement consistent with cellulitis but no evidence of underlying osteomyelitis. This is obviously relieving for a wound that initially probe to bone. This is a recurrence in the same area. We have been using collagen Electronic Signature(s) Signed: 01/19/2019 5:49:05 PM By: Joel Najjar MD Entered By: [name] on 01/19/2019 10:33:13 -------------------------------------------------------------------------------- Physical Exam Details Patient Name: Date of Service: [name] 01/19/2019 9:45 AM Medical Record [id number] Patient Account Number: [id number] Date of Birth/Sex: Treating RN: 1955/09/23 (64 y.o. M) Primary Care Provider: Other Clinician: Nicoletta [name] Referring Provider: Treating Provider/Extender:[name], [name], [name] in Treatment: 4 Constitutional Patient is hypertensive.. Pulse regular and within target range for patient.Marland Kitchen Respirations regular, non-labored and within target range.. Temperature is normal and within the target range for the patient.Marland Kitchen Appears in no distress. Notes Wound exam; hearing questions on the medial part of the right plantar great toe. Still thick callus around the wound but not as much. I did not remove this. The wound does not probe to bone. Base of the wound is healthy what I can see. There is no surrounding erythema in this diabetic foot ulcer Electronic Signature(s) Signed: 01/19/2019 5:49:05 PM By: Joel Najjar MD Entered By: [name] on 01/19/2019 10:34:07 -------------------------------------------------------------------------------- Physician Orders Details Patient Name: Date of Service: [name] 01/19/2019 9:45 AM Medical Record [id number] Patient Account Number: [id number] Date of Birth/Sex: Treating  RN: May 10, 1955 (64 y.o. [name] Primary Care Provider: Nicoletta [name] Other  Clinician: Referring Provider: Treating Provider/Extender:Athen Riel, [name], [name] in Treatment: 4 Verbal / Phone Orders: No Diagnosis Coding ICD-10 Coding Code Description E11.621 Type 2 diabetes mellitus with foot ulcer L97.514 Non-pressure chronic ulcer of other part of right foot with necrosis of bone A49.02 Methicillin resistant Staphylococcus aureus infection, unspecified site Follow-up Appointments Return Appointment in 1 week. - Thursday Dressing Change Frequency Wound #3 Medial Toe Great Change dressing every day. Wound Cleansing Wound #3 Medial Toe Great May shower and wash wound with soap and water. Primary Wound Dressing Wound #3 Medial Toe Great Silver Collagen - moisten with hydrogel or KY jelly Secondary Dressing Wound #3 Medial Toe Great Kerlix/Rolled Gauze Dry Gauze Edema Control Elevate legs to the level of the heart or above for 30 minutes daily and/or when sitting, a frequency of: - 3-4 times a day throughout the day. Other: - limit walking and standing while having the wound. Off-Loading Wound #3 Medial Toe Great Wedge shoe to: - front offloading wedge shoe use while walking and standing. Additional Orders / Instructions Follow Nutritious Diet - increase protein and vegetables within diabetic diet. closely monitor blood glucose. Patient Medications Allergies: peanut Notifications Medication Indication Start End doxycycline monohydrate wound infection 01/19/2019 DOSE oral 100 mg capsule - 1 capsule oral bid for 10 days Electronic Signature(s) Signed: 01/19/2019 10:37:46 AM By: Joel Najjar MD Entered By: [name] on 01/19/2019 10:37:45 -------------------------------------------------------------------------------- Problem List Details Patient Name: Date of Service: [name] 01/19/2019 9:45 AM Medical Record [id number]  Patient Account Number: [id number] Date of Birth/Sex: Treating RN: 12-07-1955 (64 y.o. [name] Primary Care Provider: Nicoletta [name] Other Clinician: Referring Provider: Treating Provider/Extender:[name], [name], [name] in Treatment: 4 Active Problems ICD-10 Evaluated Encounter Code Description Active Date Today Diagnosis E11.621 Type 2 diabetes mellitus with foot ulcer 12/22/2018 No Yes L97.514 Non-pressure chronic ulcer of other part of right foot 12/22/2018 No Yes with necrosis of bone A49.02 Methicillin resistant Staphylococcus aureus infection, 12/29/2018 No Yes unspecified site Inactive Problems Resolved Problems Electronic Signature(s) Signed: 01/19/2019 5:49:05 PM By: Joel Najjar MD Entered By: [name] on 01/19/2019 10:31:34 -------------------------------------------------------------------------------- Progress Note Details Patient Name: Date of Service: [name] 01/19/2019 9:45 AM Medical Record [id number] Patient Account Number: [id number] Date of Birth/Sex: Treating RN: September 20, 1955 (64 y.o. M) Primary Care Provider: Nicoletta [name] Other Clinician: Referring Provider: Treating Provider/Extender:Richardson Dubree, [name], [name] in Treatment: 4 Subjective History of Present Illness (HPI) ADMISSION 12/22/2018 This is a 64 year old type II diabetic patient who was cared for in this clinic from 2012 through 2013 by Dr. Jimmey [name] for a wound in the same location on the right medial toe. I do not have records from this time however the patient states that he was treated with an advanced treatment product and a prolonged course of total contact casting. He had an MRI of the foot but is unaware whether he had osteomyelitis at that time. He states that sometime toward the beginning of September he noticed a blister in this area with surrounding callus. He saw his primary doctor he was given a course of Augmentin and  was put in a surgical shoe. At one point it was felt to be healing nicely. He followed with his primary doctor on 10/19 noted large areas of callus in the wound. Area. He has not been x-rayed or cultured. He has been using topical antibiotic ointment. Past medical history includes type 2 diabetes with polyneuropathy, whitecoat hypertension and hyperlipidemia ABI in our clinic was noncompressible on  the right 12/24; culture I did last time showed MRSA. I started him on doxycycline 100 twice daily for 10 days on 12/21. His MRI is scheduled for January 1. I am still going to use silver alginate to this wound and we will see him back after the MRI in 10 days 01/12/2019 patient arrives with considerable amount of callus over the wound. his MRI is not done because of issues with the creatinine although this was normal in October. He is using a Darco forefoot offloading 1/14; his MRI showed a small plantar ulceration at the medial base of the great toe with adjacent soft tissue enhancement consistent with cellulitis but no evidence of underlying osteomyelitis. This is obviously relieving for a wound that initially probe to bone. This is a recurrence in the same area. We have been using collagen Objective Constitutional Patient is hypertensive.. Pulse regular and within target range for patient.Marland Kitchen Respirations regular, non-labored and within target range.. Temperature is normal and within the target range for the patient.Marland Kitchen Appears in no distress. Vitals Time Taken: 9:50 AM, Height: 72 in, Source: Stated, Weight: 220 lbs, Source: Stated, BMI: 29.8, Temperature: 98.2 F, Pulse: 91 bpm, Respiratory Rate: 18 breaths/min, Blood Pressure: 203/92 mmHg, Capillary Blood Glucose: 132 mg/dl. General Notes: glucose per pt report, BP recheck 190/89 General Notes: Wound exam; hearing questions on the medial part of the right plantar great toe. Still thick callus around the wound but not as much. I did not remove this.  The wound does not probe to bone. Base of the wound is healthy what I can see. There is no surrounding erythema in this diabetic foot ulcer Integumentary (Hair, Skin) Wound #3 status is Open. Original cause of wound was Blister. The wound is located on the Medial Toe Great. The wound measures 0.4cm length x 0.4cm width x 0.3cm depth; 0.126cm^2 area and 0.038cm^3 volume. There is Fat Layer (Subcutaneous Tissue) Exposed exposed. There is no tunneling noted, however, there is undermining starting at 12:00 and ending at 12:00 with a maximum distance of 0.2cm. There is a small amount of serosanguineous drainage noted. The wound margin is well defined and not attached to the wound base. There is large (67-100%) red granulation within the wound bed. There is no necrotic tissue within the wound bed. Assessment Active Problems ICD-10 Type 2 diabetes mellitus with foot ulcer Non-pressure chronic ulcer of other part of right foot with necrosis of bone Methicillin resistant Staphylococcus aureus infection, unspecified site Plan Follow-up Appointments: Return Appointment in 1 week. - Thursday Dressing Change Frequency: Wound #3 Medial Toe Great: Change dressing every day. Wound Cleansing: Wound #3 Medial Toe Great: May shower and wash wound with soap and water. Primary Wound Dressing: Wound #3 Medial Toe Great: Silver Collagen - moisten with hydrogel or KY jelly Secondary Dressing: Wound #3 Medial Toe Great: Kerlix/Rolled Gauze Dry Gauze Edema Control: Elevate legs to the level of the heart or above for 30 minutes daily and/or when sitting, a frequency of: - 3-4 times a day throughout the day. Other: - limit walking and standing while having the wound. Off-Loading: Wound #3 Medial Toe Great: Wedge shoe to: - front offloading wedge shoe use while walking and standing. Additional Orders / Instructions: Follow Nutritious Diet - increase protein and vegetables within diabetic diet. closely  monitor blood glucose. The following medication(s) was prescribed: doxycycline monohydrate oral 100 mg capsule 1 capsule oral bid for 10 days for wound infection starting 01/19/2019 1. I am going to continue with the silver  collagen. 2. We will see how the depth of this is next week but I think he very well will require a total contact cast. 3. His MRI commented on cellulitis therefore I think he will need antibiotics. I have given him doxycycline 100 twice daily in reference to the original culture I did that showed MRSA. Duration for 10 days 4. I have talked to him about total contact casting. I may be ready to move on this next week if the surface of the wound shows more callus and/or the depth is not contracted Electronic Signature(s) Signed: 01/19/2019 5:49:05 PM By: Linton Ham MD Entered By: Linton Ham on 01/19/2019 10:38:32 -------------------------------------------------------------------------------- SuperBill Details Patient Name: Date of Service: [name] 01/19/2019 Medical Record [id number] Patient Account Number: [id number] Date of Birth/Sex: Treating RN: 1955-12-26 (64 y.o. Janyth Contes Primary Care Provider: Shawnie Dapper Other Clinician: Referring Provider: Treating Provider/Extender:Katrell Milhorn, Peyton [name], [name] in Treatment: 4 Diagnosis Coding ICD-10 Codes Code Description E11.621 Type 2 diabetes mellitus with foot ulcer L97.514 Non-pressure chronic ulcer of other part of right foot with necrosis of bone A49.02 Methicillin resistant Staphylococcus aureus infection, unspecified site Facility Procedures CPT4 Code: [id number] Description: 99213 - WOUND CARE VISIT-LEV 3 EST PT Modifier: Quantity: 1 Physician Procedures CPT4 Code Description: [id number] - WC PHYS LEVEL 3 - EST PT ICD-10 Diagnosis Description E11.621 Type 2 diabetes mellitus with foot ulcer L97.514 Non-pressure chronic ulcer of other part of right foot with  A49.02 Methicillin resistant Staphylococcus  aureus infection, unspe Modifier: necrosis of bone cified site Quantity: 1 Electronic Signature(s) Signed: 01/19/2019 5:49:05 PM By: Linton Ham MD Entered By: Linton Ham on 01/19/2019 10:38:48

## 2019-01-19 NOTE — Progress Notes (Addendum)
[name], [name] ([id number]) Visit Report for 01/19/2019 Arrival Information Details Patient Name: Date of Service: [name], [name] 01/19/2019 9:45 AM Medical Record [id number] Patient Account Number: [id number] Date of Birth/Sex: Treating RN: [birth year] (64 y.o. [name] Primary Care Breeonna Mone: Nicoletta Ba Other Clinician: Referring Sheleen Conchas: Treating Vergie Zahm/Extender:Robson, Lottie Rater, [name] Weeks in Treatment: 4 Visit Information History Since Last Visit All ordered tests and consults Yes Patient Arrived: Ambulatory were completed: Arrival Time: 09:48 Added or deleted any No Accompanied By: self medications: Transfer Assistance: None Any new allergies or adverse No Patient Identification Verified: Yes reactions: Secondary Verification Process Yes Had a fall or experienced change No Completed: in Patient Requires Transmission- No activities of daily living that may Based Precautions: affect Patient Has Alerts: Yes risk of falls: Patient Alerts: Right ABI: non Signs or symptoms of No compress abuse/neglect since last visito Hospitalized since last visit: No Implantable device outside of the No clinic excluding cellular tissue based products placed in the center since last visit: Has Dressing in Place as Yes Prescribed: Has Footwear/Offloading in Place Yes as Prescribed: Right: Surgical Shoe with Pressure Relief Insole Pain Present Now: No Electronic Signature(s) Signed: 01/19/2019 6:38:38 PM By: Zenaida Deed RN, BSN Entered By: Zenaida Deed on 01/19/2019 09:49:18 -------------------------------------------------------------------------------- Clinic Level of Care Assessment Details Patient Name: Date of Service: [name], [name] 01/19/2019 9:45 AM Medical Record [id number] Patient Account Number: [id number] Date of Birth/Sex: Treating RN: 1955-06-21 (64 y.o. [name] Primary Care Yazlynn Birkeland:  Nicoletta Ba Other Clinician: Referring Kadin Bera: Treating Destina Mantei/Extender:Robson, Lottie Rater, [name] Weeks in Treatment: 4 Clinic Level of Care Assessment Items TOOL 4 Quantity Score X - Use when only an EandM is performed on FOLLOW-UP visit 1 0 ASSESSMENTS - Nursing Assessment / Reassessment X - Reassessment of Co-morbidities (includes updates in patient status) 1 10 X - Reassessment of Adherence to Treatment Plan 1 5 ASSESSMENTS - Wound and Skin Assessment / Reassessment X - Simple Wound Assessment / Reassessment - one wound 1 5 []  - Complex Wound Assessment / Reassessment - multiple wounds 0 []  - Dermatologic / Skin Assessment (not related to wound area) 0 ASSESSMENTS - Focused Assessment []  - Circumferential Edema Measurements - multi extremities 0 []  - Nutritional Assessment / Counseling / Intervention 0 X - Lower Extremity Assessment (monofilament, tuning fork, pulses) 1 5 []  - Peripheral Arterial Disease Assessment (using hand held doppler) 0 ASSESSMENTS - Ostomy and/or Continence Assessment and Care []  - Incontinence Assessment and Management 0 []  - Ostomy Care Assessment and Management (repouching, etc.) 0 PROCESS - Coordination of Care X - Simple Patient / Family Education for ongoing care 1 15 []  - Complex (extensive) Patient / Family Education for ongoing care 0 X - Staff obtains , Records, Test Results / Process Orders 1 10 []  - Staff telephones HHA, Nursing Homes / Clarify orders / etc 0 []  - Routine Transfer to another Facility (non-emergent condition) 0 []  - Routine Hospital Admission (non-emergent condition) 0 []  - New Admissions / / Ordering NPWT, Apligraf, etc. 0 []  - Emergency Hospital Admission (emergent condition) 0 X - Simple Discharge Coordination 1 10 []  - Complex (extensive) Discharge Coordination 0 PROCESS - Special Needs []  - Pediatric / Minor Patient Management 0 []  - Isolation Patient Management 0 []  -  Hearing / Language / Visual special needs 0 []  - Assessment of Community assistance (transportation, D/C planning, etc.) 0 []  - Additional assistance / Altered mentation 0 []  - Support Surface(s) Assessment (bed, cushion, seat, etc.)  0 INTERVENTIONS - Wound Cleansing / Measurement X - Simple Wound Cleansing - one wound 1 5 []  - Complex Wound Cleansing - multiple wounds 0 X - Wound Imaging (photographs - any number of wounds) 1 5 []  - Wound Tracing (instead of photographs) 0 X - Simple Wound Measurement - one wound 1 5 []  - Complex Wound Measurement - multiple wounds 0 INTERVENTIONS - Wound Dressings X - Small Wound Dressing one or multiple wounds 1 10 []  - Medium Wound Dressing one or multiple wounds 0 []  - Large Wound Dressing one or multiple wounds 0 X - Application of Medications - topical 1 5 []  - Application of Medications - injection 0 INTERVENTIONS - Miscellaneous []  - External ear exam 0 []  - Specimen Collection (cultures, biopsies, blood, body fluids, etc.) 0 []  - Specimen(s) / Culture(s) sent or taken to Lab for analysis 0 []  - Patient Transfer (multiple staff / / Similar devices) 0 []  - Simple Staple / Suture removal (25 or less) 0 []  - Complex Staple / Suture removal (26 or more) 0 []  - Hypo / Hyperglycemic Management (close monitor of Blood Glucose) 0 []  - Ankle / Brachial Index (ABI) - do not check if billed separately 0 X - Vital Signs 1 5 Has the patient been seen at the hospital within the last three years: Yes Total Score: 95 Level Of Care: New/Established - Level 3 Electronic Signature(s) Signed: 01/19/2019 6:31:26 PM By: RN, BSN Entered By: on 01/19/2019 10:21:57 -------------------------------------------------------------------------------- Encounter Discharge Information Details Patient Name: Date of Service: 01/19/2019 9:45 AM Medical Record Patient Account Number:  Date of Birth/Sex: Treating RN: 03-20-1955 (64 y.o. Primary Care Michaelpaul Apo: Other Clinician: Referring Loralai Eisman: Treating Hinton Luellen/Extender:Robson, , [name] Weeks in Treatment: 4 Encounter Discharge Information Items Discharge Condition: Stable Ambulatory Status: Ambulatory Discharge Destination: Home Transportation: Private Auto Accompanied By: alone Schedule Follow-up Appointment: Yes Clinical Summary of Care: Patient Declined Electronic Signature(s) Signed: 01/19/2019 6:31:26 PM By: 01/21/2019 RN, BSN Entered By: Zandra Abts on 01/19/2019 10:29:08 -------------------------------------------------------------------------------- Lower Extremity Assessment Details Patient Name: Date of Service: 01/21/2019 01/19/2019 9:45 AM Medical Record 01/21/2019 Patient Account Number: [id number] Date of Birth/Sex: Treating RN: [birth year] (64 y.o. 64 Primary Care Kierstin January: [name] Other Clinician: Referring Derius Ghosh: Treating Asante Ritacco/Extender:Robson, Nicoletta Ba, [name] Weeks in Treatment: 4 Edema Assessment Assessed: [Left: No] [Right: No] Edema: [Left: N] [Right: o] Calf Left: Right: Point of Measurement: 35 cm From Medial Instep cm 39.4 cm Ankle Left: Right: Point of Measurement: 7 cm From Medial Instep cm 24 cm Vascular Assessment Pulses: Dorsalis Pedis Palpable: [Right:Yes] Electronic Signature(s) Signed: 01/19/2019 6:38:38 PM By: 01/21/2019 RN, BSN Entered By: Zandra Abts on 01/19/2019 09:51:24 -------------------------------------------------------------------------------- Multi Wound Chart Details Patient Name: Date of Service: 01/21/2019 01/19/2019 9:45 AM Medical Record 01/21/2019 Patient Account Number: [id number] Date of Birth/Sex: Treating RN: [birth year] (64 y.o. 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] Comorbid History: [3:Hypertension, Type II Diabetes, Neuropathy] [N/A:N/A] Date Acquired: [3:09/06/2018] [N/A:N/A] Weeks of Treatment: [3:4] [N/A:N/A] Wound Status: [3:Open] [N/A:N/A] Measurements L x  W x D 0.4x0.4x0.3 [N/A:N/A] (cm) Area (cm) : [3:0.126] [N/A:N/A] Volume (cm) : [3:0.038] [N/A:N/A] % Reduction in Area: [3:85.70%] [N/A:N/A] % Reduction in Volume: 97.50% [N/A:N/A] Starting Position 1 [3:12] (o'clock): Ending Position 1 [3:12] (o'clock): Maximum Distance 1 [3:0.2] (cm): Undermining: [3:Yes] [N/A:N/A] Classification: [3:Grade 2] [N/A:N/A] Exudate Amount: [3:Small] [N/A:N/A] Exudate Type: [3:Serosanguineous] [N/A:N/A] Exudate Color: [3:red, brown] [N/A:N/A] Wound Margin: [3:Well defined, not attached] [N/A:N/A] Granulation Amount: [3:Large (67-100%)] [N/A:N/A] Granulation Quality: [3:Red] [N/A:N/A] Necrotic Amount: [3:None Present (0%)] [N/A:N/A] Exposed Structures: [3:Fat Layer (Subcutaneous Tissue) Exposed: Yes Fascia: No Tendon: No Muscle: No Joint: No Bone: No None] [N/A:N/A N/A] Treatment Notes Wound #3 (Medial Toe Great) 1. Cleanse With Wound Cleanser 3. Primary Dressing Applied Collegen AG 4. Secondary Dressing Dry Gauze Roll Gauze 5. Secured With Tape 7. Footwear/Offloading device applied Wedge shoe Electronic Signature(s) Signed: 01/19/2019 5:49:05 PM By: Linton Ham MD Entered By: Linton Ham on 01/19/2019  10:31:42 -------------------------------------------------------------------------------- Multi-Disciplinary Care Plan Details Patient Name: Date of Service: [name]. 01/19/2019 9:45 AM Medical Record [id number] Patient Account Number: [id number] Date of Birth/Sex: Treating RN: Jun 01, 1955 (64 y.o. [name] Primary Care Santiaga Butzin: Shawnie Dapper Other Clinician: Referring Joaquim Tolen: Treating Davielle Lingelbach/Extender:Robson, [name], [name] Weeks in Treatment: 4 Active Inactive Wound/Skin Impairment Nursing Diagnoses: Knowledge deficit related to ulceration/compromised skin integrity Goals: Patient/caregiver will verbalize understanding of skin care regimen Date Initiated: 12/22/2018 Target Resolution Date: 02/17/2019 Goal Status: Active Interventions: Assess patient/caregiver ability to obtain necessary supplies Assess patient/caregiver ability to perform ulcer/skin care regimen upon admission and as needed Assess ulceration(s) every visit Provide education on ulcer and skin care Treatment Activities: Skin care regimen initiated : 12/22/2018 Topical wound management initiated : 12/22/2018 Notes: Electronic Signature(s) Signed: 01/19/2019 6:31:26 PM By: Levan Hurst RN, BSN Entered By: Levan Hurst on 01/19/2019 10:21:23 -------------------------------------------------------------------------------- Pain Assessment Details Patient Name: Date of Service: [name] 01/19/2019 9:45 AM Medical Record [id number] Patient Account Number: [id number] Date of Birth/Sex: Treating RN: 1955/07/24 (64 y.o. [name] Primary Care Orabelle Rylee: Shawnie Dapper Other Clinician: Referring Prescilla Monger: Treating Racer Quam/Extender:Robson, [name], [name] Weeks in Treatment: 4 Active Problems Location of Pain Severity and Description of Pain Patient Has Paino No Site Locations Rate the pain. Current Pain Level: 0 Pain Management and  Medication Current Pain Management: Electronic Signature(s) Signed: 01/19/2019 6:38:38 PM By: Baruch Gouty RN, BSN Entered By: Baruch Gouty on 01/19/2019 09:51:03 -------------------------------------------------------------------------------- Patient/Caregiver Education Details Faylene Kurtz 1/14/2021andnbsp9:45 Patient Name: Date of Service: J. AM Medical Record Patient Account Number: [id number] Number: Treating RN: Levan Hurst Date of Birth/Gender: 11-12-1955 (63 y.o. M) Other Clinician: Primary Care Physician: Fortino Sic Referring Physician: Physician/Extender: Deno Etienne in Treatment: 4 Education Assessment Education Provided To: Patient Education Topics Provided Wound/Skin Impairment: Methods: Explain/Verbal Responses: State content correctly Electronic Signature(s) Signed: 01/19/2019 6:31:26 PM By: Levan Hurst RN, BSN Entered By: Levan Hurst on 01/19/2019 10:21:32 -------------------------------------------------------------------------------- Wound Assessment Details Patient Name: Date of Service: [name] 01/19/2019 9:45 AM Medical Record [id number] Patient Account Number: [id number] Date of Birth/Sex: Treating RN: October 29, 1955 (64 y.o. [name] Primary Care Lakecia Deschamps: Shawnie Dapper Other Clinician: Referring Gailyn Crook: Treating Isaul Landi/Extender:Robson, [name], [name] Weeks in Treatment: 4 Wound Status Wound Number: 3 Primary Diabetic Wound/Ulcer of the Lower Etiology: Extremity Wound Location: Toe Great - Medial Wound Status: Open Wounding Event: Blister Comorbid Hypertension, Type II Diabetes, Date Acquired: 09/06/2018 History: Neuropathy Weeks Of Treatment: 4 Clustered Wound: No Photos Wound Measurements Length: (cm) 0.4 % Reduction in A Width: (cm)  0.4 % Reduction in V Depth: (cm) 0.3 Epithelializatio Area: (cm) 0.126 Tunneling: Volume:  (cm) 0.038 Undermining: Starting Posi Ending Positi Maximum Dista rea: 85.7% olume: 97.5% n: None No Yes tion (o'clock): 12 on (o'clock): 12 nce: (cm) 0.2 Wound Description Classification: Grade 2 Foul Odor After Wound Margin: Well defined, not attached Slough/Fibrino Exudate Amount: Small Exudate Type: Serosanguineous Exudate Color: red, brown Wound Bed Granulation Amount: Large (67-100%) Granulation Quality: Red Fascia Exposed: Necrotic Amount: None Present (0%) Fat Layer (Subcu Tendon Exposed: Muscle Exposed: Joint Exposed: Bone Exposed: Treatment Notes Wound #3 (Medial Toe Great) 1. Cleanse With Wound Cleanser 3. Primary Dressing Applied Collegen AG 4. Secondary Dressing Dry Gauze Roll Gauze 5. Secured With Tape 7. Footwear/Offloading device applied Wedge shoe Cleansing: No No Exposed Structure No taneous Tissue) Exposed: Yes No No No No Electronic Signature(s) Signed: 01/20/2019 6:26:16 PM By: Zenaida Deed RN, BSN Signed: 01/24/2019 4:37:08 PM By: Benjaman Kindler EMT/HBOT Previous Signature: 01/19/2019 6:38:38 PM Version By: Zenaida Deed RN, BSN Entered By: Benjaman Kindler on 01/20/2019 09:48:28 -------------------------------------------------------------------------------- Vitals Details Patient Name: Date of Service: KASHIF, POOLER 01/19/2019 9:45 AM Medical Record [id number] Patient Account Number: [id number] Date of Birth/Sex: Treating RN: Apr 28, 1955 (64 y.o. [name] Primary Care Jaleah Lefevre: Nicoletta Ba Other Clinician: Referring Vanya Carberry: Treating Marchella Hibbard/Extender:Robson, Lottie Rater, [name] Weeks in Treatment: 4 Vital Signs Time Taken: 09:50 Temperature (F): 98.2 Height (in): 72 Pulse (bpm): 91 Source: Stated Respiratory Rate (breaths/min): 18 Weight (lbs): 220 Blood Pressure (mmHg): 203/92 Source: Stated Capillary Blood Glucose (mg/dl): 606 Body Mass Index (BMI): 29.8 Reference Range: 80 -  120 mg / dl Notes glucose per pt report, BP recheck 190/89 Electronic Signature(s) Signed: 01/19/2019 6:38:38 PM By: Zenaida Deed RN, BSN Entered By: Zenaida Deed on 01/19/2019 10:03:41

## 2019-01-23 NOTE — Progress Notes (Signed)
SRIHITH, AQUILINO ([id number]) Visit Report for 01/12/2019 Arrival Information Details Patient Name: Date of Service: SANDIP, POWER 01/12/2019 9:45 AM Medical Record [id number] Patient Account Number: [id number] Date of Birth/Sex: Treating RN: 05/23/1955 (64 y.o. Katherina Right Primary Care Shakeira Rhee: Nicoletta Ba Other Clinician: Referring Phelan Goers: Treating Taegan Haider/Extender:Robson, Lottie Rater, [name] Weeks in Treatment: 3 Visit Information History Since Last Visit Added or deleted any medications: No Patient Arrived: Ambulatory Any new allergies or adverse reactions: No Arrival Time: 10:27 Had a fall or experienced change in No Accompanied By: self activities of daily living that may affect Transfer Assistance: None risk of falls: Patient Identification Verified: Yes Signs or symptoms of abuse/neglect since last No Secondary Verification Process Yes visito Completed: Hospitalized since last visit: No Patient Requires Transmission- No Implantable device outside of the clinic excluding No Based Precautions: cellular tissue based products placed in the center Patient Has Alerts: Yes since last visit: Patient Alerts: Right ABI: non Has Dressing in Place as Prescribed: Yes compress Pain Present Now: No Electronic Signature(s) Signed: 01/12/2019 5:21:35 PM By: Cherylin Mylar Entered By: Cherylin Mylar on 01/12/2019 10:27:27 -------------------------------------------------------------------------------- Encounter Discharge Information Details Patient Name: Date of Service: [name] 01/12/2019 9:45 AM Medical Record [id number] Patient Account Number: [id number] Date of Birth/Sex: Treating RN: 06-Sep-1955 (64 y.o. Elizebeth Koller Primary Care Lecretia Buczek: Nicoletta Ba Other Clinician: Referring Dusti Tetro: Treating Katelynd Blauvelt/Extender:Robson, Lottie Rater, [name] Weeks in Treatment: 3 Encounter Discharge Information  Items Post Procedure Vitals Discharge Condition: Stable Temperature (F): 98.2 Ambulatory Status: Ambulatory Pulse (bpm): 90 Discharge Destination: Home Respiratory Rate (breaths/min): 18 Transportation: Other Blood Pressure (mmHg): 179/90 Accompanied By: alone Schedule Follow-up Appointment: Yes Clinical Summary of Care: Patient Declined Electronic Signature(s) Signed: 01/17/2019 6:18:44 PM By: Zandra Abts RN, BSN Entered By: Zandra Abts on 01/12/2019 11:43:56 -------------------------------------------------------------------------------- Lower Extremity Assessment Details Patient Name: Date of Service: [name] 01/12/2019 9:45 AM Medical Record [id number] Patient Account Number: [id number] Date of Birth/Sex: Treating RN: 05-20-1955 (64 y.o. Katherina Right Primary Care Jie Stickels: Nicoletta Ba Other Clinician: Referring Nija Koopman: Treating Maricus Tanzi/Extender:Robson, Lottie Rater, [name] Weeks in Treatment: 3 Edema Assessment Assessed: [Left: No] [Right: No] Edema: [Left: N] [Right: o] Calf Left: Right: Point of Measurement: 35 cm From Medial Instep cm 39.4 cm Ankle Left: Right: Point of Measurement: 7 cm From Medial Instep cm 24 cm Vascular Assessment Pulses: Dorsalis Pedis Palpable: [Right:Yes] Electronic Signature(s) Signed: 01/12/2019 5:21:35 PM By: Cherylin Mylar Entered By: Cherylin Mylar on 01/12/2019 10:30:19 -------------------------------------------------------------------------------- Multi Wound Chart Details Patient Name: Date of Service: [name] 01/12/2019 9:45 AM Medical Record [id number] Patient Account Number: [id number] Date of Birth/Sex: Treating RN: 07-17-1955 (64 y.o. 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] Comorbid History: [3:Hypertension, Type II Diabetes, Neuropathy] [N/A:N/A] Date Acquired: [3:09/06/2018] [N/A:N/A] Weeks of Treatment: [3:3] [N/A:N/A] Wound Status: [3:Open] [N/A:N/A] Measurements L x W x D 0.6x0.5x0.3 [N/A:N/A] (cm) Area (cm) : [3:0.236] [N/A:N/A] Volume (cm) : [3:0.071] [N/A:N/A] % Reduction in Area: [3:73.20%] [N/A:N/A] % Reduction in Volume: 95.30% [N/A:N/A] Classification: [3:Grade 2] [N/A:N/A] Exudate Amount: [3:None Present] [N/A:N/A] Wound Margin: [3:Well defined, not attached N/A] Granulation Amount: [3:None Present (0%)] [N/A:N/A] Necrotic Amount: [3:Medium (34-66%)] [N/A:N/A] Necrotic Tissue: [3:Eschar,  Adherent Slough N/A] Exposed Structures: [3:Fascia: No Fat Layer (Subcutaneous Tissue) Exposed: No Tendon: No Muscle: No Joint: No Bone: No] [N/A:N/A] Epithelialization: [3:None] [N/A:N/A] Debridement: [3:Debridement - Excisional N/A] Pre-procedure [3:11:15] [N/A:N/A] Verification/Time Out Taken: Pain Control: [3:Lidocaine 4% Topical Solution] [N/A:N/A] Tissue Debrided: [3:Callus, Subcutaneous] [N/A:N/A] Level: [3:Skin/Subcutaneous Tissue N/A] Debridement Area (sq cm):1 [N/A:N/A] Instrument: [3:Curette] [N/A:N/A] Bleeding: [3:Moderate] [N/A:N/A] Hemostasis Achieved: [3:Pressure] [N/A:N/A] Procedural Pain: [3:0] [N/A:N/A] Post Procedural Pain: [3:0] [N/A:N/A] Debridement Treatment Procedure was tolerated [N/A:N/A] Response: [3:well] Post Debridement [3:0.6x0.5x0.3] [N/A:N/A] Measurements L x W x D (cm) Post Debridement [3:0.071] [N/A:N/A] Volume: (cm) Procedures Performed: Debridement [N/A:N/A] Treatment Notes Electronic Signature(s) Signed: 01/13/2019  4:55:01 AM By: Baltazar Najjar MD Entered By: Baltazar Najjar on 01/12/2019 11:31:00 -------------------------------------------------------------------------------- Multi-Disciplinary Care Plan Details Patient Name: Date of Service: [name]. 01/12/2019 9:45 AM Medical Record [id number] Patient Account Number: [id number] Date of Birth/Sex: Treating RN: 1955/10/09 (64 y.o. [name] Primary Care Neenah Canter: Nicoletta Ba Other Clinician: Referring Britny Riel: Treating Jamonte Curfman/Extender:Robson, Lottie Rater, [name] Weeks in Treatment: 3 Active Inactive Abuse / Safety / Falls / Self Care Management Nursing Diagnoses: Potential for falls Goals: Patient will remain injury free related to falls Date Initiated: 12/22/2018 Target Resolution Date: 01/19/2019 Goal Status: Active Interventions: Assess fall risk on admission and as needed Assess self care needs on admission and as needed Provide education on fall prevention Notes: Electronic Signature(s) Signed: 01/12/2019 5:19:45 PM By: Shawn Stall Entered By: Shawn Stall on 01/12/2019 11:19:55 -------------------------------------------------------------------------------- Pain Assessment Details Patient Name: Date of Service: KALAI, BACA 01/12/2019 9:45 AM Medical Record [id number] Patient Account Number: [id number] Date of Birth/Sex: Treating RN: 1955-05-20 (64 y.o. Katherina Right Primary Care Ayanah Snader: Nicoletta Ba Other Clinician: Referring Ajene Carchi: Treating Samyrah Bruster/Extender:Robson, Lottie Rater, [name] Weeks in Treatment: 3 Active Problems Location of Pain Severity and Description of Pain Patient Has Paino No Site Locations Pain Management and Medication Current Pain Management: Electronic Signature(s) Signed: 01/12/2019 5:21:35 PM By: Cherylin Mylar Entered By: Cherylin Mylar on 01/12/2019  10:28:52 -------------------------------------------------------------------------------- Patient/Caregiver Education Details Patient Name: Date of Service: [name] 1/7/2021andnbsp9:45 AM Medical Record [id number] Patient Account Number: [id number] Date of Birth/Gender: Treating RN: Feb 02, 1955 (64 y.o. [name] Primary Care Physician: Nicoletta Ba Other Clinician: Referring Physician: Treating Physician/Extender:Robson, Lottie Rater, [name] Weeks in Treatment: 3 Education Assessment Education Provided To: Patient Education Topics Provided Wound/Skin Impairment: Handouts: Skin Care Do's and Dont's Methods: Explain/Verbal Responses: Reinforcements needed Electronic Signature(s) Signed: 01/12/2019 5:19:45 PM By: Shawn Stall Entered By: Shawn Stall on 01/12/2019 11:20:43 -------------------------------------------------------------------------------- Wound Assessment Details Patient Name: Date of Service: OBDULIO, MASH 01/12/2019 9:45 AM Medical Record [id number] Patient Account Number: [id number] Date of Birth/Sex: Treating RN: 09-21-1955 (64 y.o. [name] Primary Care Tivis Wherry: Nicoletta Ba Other Clinician: Referring Jorden Mahl: Treating Lovely Kerins/Extender:Robson, Lottie Rater, [name] Weeks in Treatment: 3 Wound Status Wound Number: 3 Primary Diabetic Wound/Ulcer of the Lower Etiology: Extremity Wound Location: Toe Great - Medial Wound Status: Open Wounding Event: Blister Comorbid Hypertension, Type II Diabetes, Date Acquired: 09/06/2018 History: Neuropathy Weeks Of Treatment: 3 Clustered Wound: No Photos Wound Measurements Length: (cm) 0.6 Width: (cm) 0.5 Depth: (cm) 0.3 Area: (cm) 0.236 Volume: (cm) 0.071 Wound Description Classification: Grade 2 Wound Margin: Well defined, not attached Exudate Amount: None Present Wound Bed Granulation Amount: None Present (0%) Necrotic Amount: Medium  (34-66%) Necrotic Quality: Eschar, Adherent Slough After Cleansing: No rino No Exposed Structure posed: No (Subcutaneous Tissue) Exposed: No posed: No posed: No osed: No sed: No % Reduction in Area: 73.2% %  Reduction in Volume: 95.3% Epithelialization: None Tunneling: No Undermining: No Foul Odor Slough/Fib Fascia Ex Fat Layer Tendon Ex Muscle Ex Joint Exp Bone Expo Electronic Signature(s) Signed: 01/13/2019 3:20:21 PM By: Mikeal Hawthorne EMT/HBOT Signed: 01/23/2019 5:45:39 PM By: Deon Pilling Previous Signature: 01/12/2019 5:19:45 PM Version By: Deon Pilling Entered By: Mikeal Hawthorne on 01/13/2019 14:32:25 -------------------------------------------------------------------------------- Vitals Details Patient Name: Date of Service: [name] 01/12/2019 9:45 AM Medical Record [id number] Patient Account Number: [id number] Date of Birth/Sex: Treating RN: 1955/12/17 (64 y.o. Marvis Repress Primary Care Elis Sauber: Shawnie Dapper Other Clinician: Referring Arienne Gartin: Treating Landyn Lorincz/Extender:Robson, [name], [name] Weeks in Treatment: 3 Vital Signs Time Taken: 10:25 Temperature (F): 98.2 Height (in): 72 Pulse (bpm): 90 Weight (lbs): 220 Respiratory Rate (breaths/min): 18 Body Mass Index (BMI): 29.8 Blood Pressure (mmHg): 179/90 Reference Range: 80 - 120 mg / dl Electronic Signature(s) Signed: 01/12/2019 5:21:35 PM By: Kela Millin Entered By: Kela Millin on 01/12/2019 10:38:25

## 2019-01-24 ENCOUNTER — Encounter: Payer: BC Managed Care – PPO | Admitting: Family Medicine

## 2019-01-26 ENCOUNTER — Encounter (HOSPITAL_BASED_OUTPATIENT_CLINIC_OR_DEPARTMENT_OTHER): Payer: BC Managed Care – PPO | Admitting: Internal Medicine

## 2019-01-26 ENCOUNTER — Other Ambulatory Visit: Payer: Self-pay

## 2019-01-26 DIAGNOSIS — E11621 Type 2 diabetes mellitus with foot ulcer: Secondary | ICD-10-CM | POA: Diagnosis not present

## 2019-01-26 NOTE — Progress Notes (Signed)
[name], [name] ([id number]) Visit Report for 01/26/2019 Debridement Details Patient Name: [name], [name] Date of Service: 01/26/2019 9:30 AM Medical Record [id number] Patient Account Number: [id number] Date of Birth/Sex: Treating RN: 06-14-1955 (64 y.o. M) Primary Care Provider: Nicoletta Ba Other Clinician: Referring Provider: Treating Provider/Extender:Lilyauna Miedema, Lottie Rater, [name] Weeks in Treatment: 5 Debridement Performed for Wound #3 Medial Toe Great Assessment: Performed By: Physician Maxwell Caul., MD Debridement Type: Debridement Severity of Tissue Pre Fat layer exposed Debridement: Level of Consciousness (Pre- Awake and Alert procedure): Pre-procedure Verification/Time Out Taken: Yes - 10:47 Start Time: 10:48 Pain Control: Lidocaine 4% Topical Solution Total Area Debrided (L x W): 0.3 (cm) x 0.3 (cm) = 0.09 (cm) Tissue and other material Viable, Non-Viable, Callus, Subcutaneous, Skin: Dermis , Fibrin/Exudate debrided: Level: Skin/Subcutaneous Tissue Debridement Description: Excisional Instrument: Curette Bleeding: Minimum Hemostasis Achieved: Pressure End Time: 10:51 Procedural Pain: 0 Post Procedural Pain: 0 Response to Treatment: Procedure was tolerated well Level of Consciousness Awake and Alert (Post-procedure): Post Debridement Measurements of Total Wound Length: (cm) 0.3 Width: (cm) 0.3 Depth: (cm) 0.3 Volume: (cm) 0.021 Character of Wound/Ulcer Post Improved Debridement: Severity of Tissue Post Debridement: Fat layer exposed Post Procedure Diagnosis Same as Pre-procedure Electronic Signature(s) Signed: 01/26/2019 5:54:58 PM By: Baltazar Najjar MD Entered By: Baltazar Najjar on 01/26/2019 11:11:32 -------------------------------------------------------------------------------- HPI Details Patient Name: Date of Service: [name] 01/26/2019 9:30 AM Medical Record [id number] Patient Account Number:  [id number] Date of Birth/Sex: Treating RN: [birth year] (64 y.o. M) Primary Care Provider: Nicoletta Ba Other Clinician: Referring Provider: Treating Provider/Extender:Ahja Martello, Lottie Rater, [name] Weeks in Treatment: 5 History of Present Illness HPI Description: ADMISSION 12/22/2018 This is a 64 year old type II diabetic patient who was cared for in this clinic from 2012 through 2013 by Dr. Jimmey Ralph for a wound in the same location on the right medial toe. I do not have records from this time however the patient states that he was treated with an advanced treatment product and a prolonged course of total contact casting. He had an MRI of the foot but is unaware whether he had osteomyelitis at that time. He states that sometime toward the beginning of September he noticed a blister in this area with surrounding callus. He saw his primary doctor he was given a course of Augmentin and was put in a surgical shoe. At one point it was felt to be healing nicely. He followed with his primary doctor on 10/19 noted large areas of callus in the wound. Area. He has not been x-rayed or cultured. He has been using topical antibiotic ointment. Past medical history includes type 2 diabetes with polyneuropathy, whitecoat hypertension and hyperlipidemia ABI in our clinic was noncompressible on the right 12/24; culture I did last time showed MRSA. I started him on doxycycline 100 twice daily for 10 days on 12/21. His MRI is scheduled for January 1. I am still going to use silver alginate to this wound and we will see him back after the MRI in 10 days 01/12/2019 patient arrives with considerable amount of callus over the wound. his MRI is not done because of issues with the creatinine although this was normal in October. He is using a Darco forefoot offloading 1/14; his MRI showed a small plantar ulceration at the medial base of the great toe with adjacent soft tissue enhancement consistent with cellulitis but  no evidence of underlying osteomyelitis. This is obviously relieving for a wound that initially probe to bone. This is a recurrence in the same area.  We have been using collagen 1/21; the patient is tolerating the antibiotic I gave him last week doxycycline. We have been using collagen. The wound is come down in size still with 3 mm of depth. He is using a forefoot off loader Electronic Signature(s) Signed: 01/26/2019 5:54:58 PM By: Linton Ham MD Entered By: Linton Ham on 01/26/2019 11:12:27 -------------------------------------------------------------------------------- Physical Exam Details Patient Name: Date of Service: [name] 01/26/2019 9:30 AM Medical Record [id number] Patient Account Number: [id number] Date of Birth/Sex: Treating RN: January 30, 1955 (64 y.o. M) Primary Care Provider: Shawnie Dapper Other Clinician: Referring Provider: Treating Provider/Extender:Aseem Sessums, [name], [name] Weeks in Treatment: 5 Constitutional Patient is hypertensive.. Pulse regular and within target range for patient.Marland Kitchen Respirations regular, non-labored and within target range.. Temperature is normal and within the target range for the patient.Marland Kitchen Appears in no distress. Notes Wound exam; the area in question is on the medial part of the right plantar great toe. Still callus and skin around the wound and debris in the wound surface that I removed with a #3 curette. This is true every week. There is still probing depth of this at 3 mm this does not probe to bone there is no erythema around the wound no purulent drainage Electronic Signature(s) Signed: 01/26/2019 5:54:58 PM By: Linton Ham MD Entered By: Linton Ham on 01/26/2019 11:13:33 -------------------------------------------------------------------------------- Physician Orders Details Patient Name: Date of Service: [name] 01/26/2019 9:30 AM Medical Record [id number] Patient Account  Number: [id number] Date of Birth/Sex: Treating RN: Dec 18, 1955 (64 y.o. Hessie Diener Primary Care Provider: Shawnie Dapper Other Clinician: Referring Provider: Treating Provider/Extender:Meric Joye, [name], [name] Weeks in Treatment: 5 Verbal / Phone Orders: No Diagnosis Coding ICD-10 Coding Code Description E11.621 Type 2 diabetes mellitus with foot ulcer L97.514 Non-pressure chronic ulcer of other part of right foot with necrosis of bone A49.02 Methicillin resistant Staphylococcus aureus infection, unspecified site Follow-up Appointments Return Appointment in 1 week. - Thursday Dressing Change Frequency Wound #3 Medial Toe Great Change dressing every day. Wound Cleansing Wound #3 Medial Toe Great May shower and wash wound with soap and water. Primary Wound Dressing Wound #3 Medial Toe Great Silver Collagen - moisten with hydrogel or KY jelly Secondary Dressing Wound #3 Medial Toe Great Kerlix/Rolled Gauze Dry Gauze Edema Control Elevate legs to the level of the heart or above for 30 minutes daily and/or when sitting, a frequency of: - 3-4 times a day throughout the day. Other: - limit walking and standing while having the wound. Off-Loading Wound #3 Medial Toe Great Wedge shoe to: - front offloading wedge shoe use while walking and standing. Additional Orders / Instructions Follow Nutritious Diet - increase protein and vegetables within diabetic diet. closely monitor blood glucose. Electronic Signature(s) Signed: 01/26/2019 5:54:58 PM By: Linton Ham MD Signed: 01/26/2019 6:20:01 PM By: Deon Pilling Entered By: Deon Pilling on 01/26/2019 10:52:13 -------------------------------------------------------------------------------- Problem List Details Patient Name: Date of Service: [name], [name] 01/26/2019 9:30 AM Medical Record [id number] Patient Account Number: [id number] Date of Birth/Sex: Treating RN: 1955/03/15 (64 y.o. Hessie Diener Primary Care Provider: Shawnie Dapper Other Clinician: Referring Provider: Treating Provider/Extender:Montina Dorrance, [name], [name] Weeks in Treatment: 5 Active Problems ICD-10 Evaluated Encounter Code Description Active Date Today Diagnosis E11.621 Type 2 diabetes mellitus with foot ulcer 12/22/2018 No Yes L97.514 Non-pressure chronic ulcer of other part of right foot 12/22/2018 No Yes with necrosis of bone A49.02 Methicillin resistant Staphylococcus aureus infection, 12/29/2018 No Yes unspecified site Inactive Problems Resolved Problems Electronic Signature(s) Signed: 01/26/2019 5:54:58  PM By: Baltazar Najjar MD Entered By: Baltazar Najjar on 01/26/2019 11:11:14 -------------------------------------------------------------------------------- Progress Note Details Patient Name: Date of Service: [name] 01/26/2019 9:30 AM Medical Record [id number] Patient Account Number: [id number] Date of Birth/Sex: Treating RN: [birth year] (63 y.o. M) Primary Care Provider: Nicoletta Ba Other Clinician: Referring Provider: Treating Provider/Extender:Merelin Human, Lottie Rater, [name] Weeks in Treatment: 5 Subjective History of Present Illness (HPI) ADMISSION 12/22/2018 This is a 64 year old type II diabetic patient who was cared for in this clinic from 2012 through 2013 by Dr. Jimmey Ralph for a wound in the same location on the right medial toe. I do not have records from this time however the patient states that he was treated with an advanced treatment product and a prolonged course of total contact casting. He had an MRI of the foot but is unaware whether he had osteomyelitis at that time. He states that sometime toward the beginning of September he noticed a blister in this area with surrounding callus. He saw his primary doctor he was given a course of Augmentin and was put in a surgical shoe. At one point it was felt to be healing nicely. He followed with his  primary doctor on 10/19 noted large areas of callus in the wound. Area. He has not been x-rayed or cultured. He has been using topical antibiotic ointment. Past medical history includes type 2 diabetes with polyneuropathy, whitecoat hypertension and hyperlipidemia ABI in our clinic was noncompressible on the right 12/24; culture I did last time showed MRSA. I started him on doxycycline 100 twice daily for 10 days on 12/21. His MRI is scheduled for January 1. I am still going to use silver alginate to this wound and we will see him back after the MRI in 10 days 01/12/2019 patient arrives with considerable amount of callus over the wound. his MRI is not done because of issues with the creatinine although this was normal in October. He is using a Darco forefoot offloading 1/14; his MRI showed a small plantar ulceration at the medial base of the great toe with adjacent soft tissue enhancement consistent with cellulitis but no evidence of underlying osteomyelitis. This is obviously relieving for a wound that initially probe to bone. This is a recurrence in the same area. We have been using collagen 1/21; the patient is tolerating the antibiotic I gave him last week doxycycline. We have been using collagen. The wound is come down in size still with 3 mm of depth. He is using a forefoot off loader Objective Constitutional Patient is hypertensive.. Pulse regular and within target range for patient.Marland Kitchen Respirations regular, non-labored and within target range.. Temperature is normal and within the target range for the patient.Marland Kitchen Appears in no distress. Vitals Time Taken: 10:15 AM, Height: 72 in, Weight: 220 lbs, BMI: 29.8, Temperature: 97.9 F, Pulse: 79 bpm, Respiratory Rate: 18 breaths/min, Blood Pressure: 198/82 mmHg, Capillary Blood Glucose: 132 mg/dl. General Notes: glucose per pt report General Notes: Wound exam; the area in question is on the medial part of the right plantar great toe. Still  callus and skin around the wound and debris in the wound surface that I removed with a #3 curette. This is true every week. There is still probing depth of this at 3 mm this does not probe to bone there is no erythema around the wound no purulent drainage Integumentary (Hair, Skin) Wound #3 status is Open. Original cause of wound was Blister. The wound is located on the Medial Toe Great. The wound measures  0.3cm length x 0.3cm width x 0.3cm depth; 0.071cm^2 area and 0.021cm^3 volume. There is Fat Layer (Subcutaneous Tissue) Exposed exposed. There is no tunneling or undermining noted. There is a small amount of serosanguineous drainage noted. The wound margin is well defined and not attached to the wound base. There is large (67-100%) pink granulation within the wound bed. There is no necrotic tissue within the wound bed. Assessment Active Problems ICD-10 Type 2 diabetes mellitus with foot ulcer Non-pressure chronic ulcer of other part of right foot with necrosis of bone Methicillin resistant Staphylococcus aureus infection, unspecified site Procedures Wound #3 Pre-procedure diagnosis of Wound #3 is a Diabetic Wound/Ulcer of the Lower Extremity located on the Medial Toe Great .Severity of Tissue Pre Debridement is: Fat layer exposed. There was a Excisional Skin/Subcutaneous Tissue Debridement with a total area of 0.09 sq cm performed by Maxwell Caulobson, Wednesday Ericsson G., MD. With the following instrument(s): Curette to remove Viable and Non-Viable tissue/material. Material removed includes Callus, Subcutaneous Tissue, Skin: Dermis, and Fibrin/Exudate after achieving pain control using Lidocaine 4% Topical Solution. A time out was conducted at 10:47, prior to the start of the procedure. A Minimum amount of bleeding was controlled with Pressure. The procedure was tolerated well with a pain level of 0 throughout and a pain level of 0 following the procedure. Post Debridement Measurements: 0.3cm length x 0.3cm  width x 0.3cm depth; 0.021cm^3 volume. Character of Wound/Ulcer Post Debridement is improved. Severity of Tissue Post Debridement is: Fat layer exposed. Post procedure Diagnosis Wound #3: Same as Pre-Procedure Plan Follow-up Appointments: Return Appointment in 1 week. - Thursday Dressing Change Frequency: Wound #3 Medial Toe Great: Change dressing every day. Wound Cleansing: Wound #3 Medial Toe Great: May shower and wash wound with soap and water. Primary Wound Dressing: Wound #3 Medial Toe Great: Silver Collagen - moisten with hydrogel or KY jelly Secondary Dressing: Wound #3 Medial Toe Great: Kerlix/Rolled Gauze Dry Gauze Edema Control: Elevate legs to the level of the heart or above for 30 minutes daily and/or when sitting, a frequency of: - 3-4 times a day throughout the day. Other: - limit walking and standing while having the wound. Off-Loading: Wound #3 Medial Toe Great: Wedge shoe to: - front offloading wedge shoe use while walking and standing. Additional Orders / Instructions: Follow Nutritious Diet - increase protein and vegetables within diabetic diet. closely monitor blood glucose. #1 continue with silver collagen 2. I essentially gave the patient an option of a total contact cast today versus waiting a week and seeing if this would fill-in. He elected to wait a week. I think this is predominantly because he had a bad experience with our original total contact casting about 8 years ago when he was here. 3. If this is no better next week I would like to move on total contact casting #4 we monitor the patient's blood pressure while he was in the clinic exiting at 176/94. This is still high and he is aware of this. He needs to see his primary physician Electronic Signature(s) Signed: 01/26/2019 5:54:58 PM By: Baltazar Najjarobson, Amberli Ruegg MD Entered By: Baltazar Najjarobson, Arcadia Gorgas on 01/26/2019 11:17:36 -------------------------------------------------------------------------------- SuperBill  Details Patient Name: Date of Service: [name], [name] 01/26/2019 Medical Record [id number] Patient Account Number: [id number] Date of Birth/Sex: Treating RN: [birth year] (64 y.o. [name]) [name], Joel Primary Care Provider: Nicoletta BaMCGOWEN, [name] Other Clinician: Referring Provider: Treating Provider/Extender:Neri Samek, Lottie RaterMichael MCGOWEN, [name] Weeks in Treatment: 5 Diagnosis Coding ICD-10 Codes Code Description E11.621 Type 2 diabetes mellitus with foot ulcer L97.514 Non-pressure  chronic ulcer of other part of right foot with necrosis of bone A49.02 Methicillin resistant Staphylococcus aureus infection, unspecified site Facility Procedures CPT4 Code Description: [id number] 11042 - DEB SUBQ TISSUE 20 SQ CM/< ICD-10 Diagnosis Description L97.514 Non-pressure chronic ulcer of other part of right foot wit Modifier: h necrosis of Quantity: 1 bone Physician Procedures CPT4 Code Description: [id number] 11042 - WC PHYS SUBQ TISS 20 SQ CM ICD-10 Diagnosis Description L97.514 Non-pressure chronic ulcer of other part of right foot wit Modifier: h necrosis of Quantity: 1 bone Electronic Signature(s) Signed: 01/26/2019 5:54:58 PM By: Baltazar Najjar MD Entered By: Baltazar Najjar on 01/26/2019 11:17:46

## 2019-01-30 NOTE — Progress Notes (Signed)
[name] ([id number]) Visit Report for 01/26/2019 Arrival Information Details Patient Name: Date of Service: Joel, [name] 01/26/2019 9:30 AM Medical Record [id number] Patient Account Number: [id number] Date of Birth/Sex: Treating RN: December 22, 1955 (64 y.o. [name] Primary Care Leanore Biggers: Shawnie Dapper Other Clinician: Referring Lance Huaracha: Treating Bartholomew Ramesh/Extender:Robson, Peyton Joel, [name] Weeks in Treatment: 5 Visit Information History Since Last Visit Added or deleted any medications: No Patient Arrived: Ambulatory Any new allergies or adverse reactions: No Arrival Time: 10:13 Had a fall or experienced change in No Accompanied By: alone activities of daily living that may affect Transfer Assistance: None risk of falls: Patient Identification Verified: Yes Signs or symptoms of abuse/neglect since No Secondary Verification Process Yes last visito Completed: Hospitalized since last visit: No Patient Requires Transmission- No Implantable device outside of the clinic No Based Precautions: excluding Patient Has Alerts: Yes cellular tissue based products placed in the Patient Alerts: Right ABI: non center compress since last visit: Has Dressing in Place as Prescribed: Yes Has Footwear/Offloading in Place as Yes Prescribed: Right: Wedge Shoe Pain Present Now: No Electronic Signature(s) Signed: 01/30/2019 6:46:58 PM By: Levan Hurst RN, BSN Entered By: Levan Hurst on 01/26/2019 10:14:15 -------------------------------------------------------------------------------- Encounter Discharge Information Details Patient Name: Date of Service: [name] 01/26/2019 9:30 AM Medical Record [id number] Patient Account Number: [id number] Date of Birth/Sex: Treating RN: 1955/06/08 (64 y.o. [name] Primary Care Briyah Wheelwright: Shawnie Dapper Other Clinician: Referring Ritvik Mczeal: Treating Jaden Batchelder/Extender:Robson,  Peyton Joel, [name] Weeks in Treatment: 5 Encounter Discharge Information Items Post Procedure Vitals Discharge Condition: Stable Temperature (F): 97.9 Ambulatory Status: Ambulatory Pulse (bpm): 75 Discharge Destination: Home Respiratory Rate (breaths/min): 18 Transportation: Private Auto Blood Pressure (mmHg): 176/91 Accompanied By: self Schedule Follow-up Appointment: Yes Clinical Summary of Care: Patient Declined Electronic Signature(s) Signed: 01/26/2019 5:58:49 PM By: Baruch Gouty RN, BSN Entered By: Baruch Gouty on 01/26/2019 11:13:01 -------------------------------------------------------------------------------- Lower Extremity Assessment Details Patient Name: Date of Service: [name] 01/26/2019 9:30 AM Medical Record [id number] Patient Account Number: [id number] Date of Birth/Sex: Treating RN: 11/22/1955 (64 y.o. [name] Primary Care Rhyan Wolters: Shawnie Dapper Other Clinician: Referring Marjorie Lussier: Treating Marit Goodwill/Extender:Robson, Peyton Joel, [name] Weeks in Treatment: 5 Edema Assessment Assessed: [Left: No] [Right: No] Edema: [Left: N] [Right: o] Calf Left: Right: Point of Measurement: 35 cm From Medial Instep cm 39.4 cm Ankle Left: Right: Point of Measurement: 7 cm From Medial Instep cm 24 cm Vascular Assessment Pulses: Dorsalis Pedis Palpable: [Right:Yes] Electronic Signature(s) Signed: 01/30/2019 6:46:58 PM By: Levan Hurst RN, BSN Entered By: Levan Hurst on 01/26/2019 10:22:19 -------------------------------------------------------------------------------- Multi Wound Chart Details Patient Name: Date of Service: [name] 01/26/2019 9:30 AM Medical Record [id number] Patient Account Number: [id number] Date of Birth/Sex: Treating RN: [birth year] (64 y.o. 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] Comorbid History: [3:Hypertension, Type II Diabetes, Neuropathy] [N/A:N/A] Date Acquired: [3:09/06/2018] [N/A:N/A] Weeks of Treatment: [3:5] [N/A:N/A] Wound Status: [3:Open] [N/A:N/A] Measurements L x W x D 0.3x0.3x0.3 [N/A:N/A] (cm) Area (cm) : [3:0.071] [N/A:N/A] Volume (cm) : [3:0.021] [N/A:N/A] % Reduction in Area: [3:91.90%] [N/A:N/A] % Reduction in Volume: 98.60% [N/A:N/A] Classification: [3:Grade 2] [N/A:N/A] Exudate Amount: [3:Small] [N/A:N/A] Exudate Type: [3:Serosanguineous] [  N/A:N/A] Exudate Color: [3:red, brown] [N/A:N/A] Wound Margin: [3:Well defined, not attached N/A] Granulation Amount: [3:Large (67-100%)] [N/A:N/A] Granulation Quality: [3:Pink] [N/A:N/A] Necrotic Amount: [3:None Present (0%)] [N/A:N/A] Exposed Structures: [3:Fat Layer (Subcutaneous N/A Tissue) Exposed: Yes Fascia: No Tendon: No Muscle: No Joint: No Bone: No] Epithelialization: [3:Medium (34-66%)] [N/A:N/A] Debridement: [3:Debridement - Excisional N/A] Pre-procedure [3:10:47] [N/A:N/A] Verification/Time Out Taken: Pain Control: [3:Lidocaine 4% Topical Solution] [N/A:N/A] Tissue Debrided: [3:Callus, Subcutaneous] [N/A:N/A] Level: [3:Skin/Subcutaneous Tissue] [N/A:N/A] Debridement Area (sq cm):0.09 [N/A:N/A] Instrument: [3:Curette] [N/A:N/A] Bleeding: [3:Minimum] [N/A:N/A] Hemostasis Achieved: [3:Pressure] [N/A:N/A] Procedural Pain: [3:0] [N/A:N/A] Post Procedural Pain: [3:0] [N/A:N/A] Debridement Treatment Procedure was tolerated [N/A:N/A] Response: [3:well] Post  Debridement [3:0.3x0.3x0.3] [N/A:N/A] Measurements L x W x D (cm) Post Debridement [3:0.021] [N/A:N/A] Volume: (cm) Procedures Performed: Debridement [N/A:N/A] Treatment Notes Electronic Signature(s) Signed: 01/26/2019 5:54:58 PM By: Baltazar [name] Entered By: Baltazar Joel on 01/26/2019 11:11:23 -------------------------------------------------------------------------------- Multi-Disciplinary Care Plan Details Patient Name: Date of Service: [name]. 01/26/2019 9:30 AM Medical Record [id number] Patient Account Number: [id number] Date of Birth/Sex: Treating RN: 1955-11-03 (64 y.o. [name] Primary Care Kiandra Sanguinetti: Nicoletta Ba Other Clinician: Referring Fiore Detjen: Treating Brayam Boeke/Extender:Robson, Lottie Rater, [name] Weeks in Treatment: 5 Active Inactive Wound/Skin Impairment Nursing Diagnoses: Knowledge deficit related to ulceration/compromised skin integrity Goals: Patient/caregiver will verbalize understanding of skin care regimen Date Initiated: 12/22/2018 Target Resolution Date: 04/07/2019 Goal Status: Active Interventions: Assess patient/caregiver ability to obtain necessary supplies Assess patient/caregiver ability to perform ulcer/skin care regimen upon admission and as needed Assess ulceration(s) every visit Provide education on ulcer and skin care Treatment Activities: Skin care regimen initiated : 12/22/2018 Topical wound management initiated : 12/22/2018 Notes: Electronic Signature(s) Signed: 01/26/2019 6:20:01 PM By: Shawn Stall Entered By: Shawn Stall on 01/26/2019 10:12:02 -------------------------------------------------------------------------------- Pain Assessment Details Patient Name: Date of Service: Joel, [name] 01/26/2019 9:30 AM Medical Record [id number] Patient Account Number: [id number] Date of Birth/Sex: Treating RN: 02-24-1955 (64 y.o. Elizebeth Koller Primary Care Drexel Ivey: Nicoletta Ba Other Clinician: Referring Daulton Harbaugh: Treating Oluwanifemi Petitti/Extender:Robson, Lottie Rater, [name] Weeks in Treatment: 5 Active Problems Location of Pain Severity and Description of Pain Patient Has Paino No Site Locations Pain Management and Medication Current Pain Management: Electronic Signature(s) Signed: 01/30/2019 6:46:58 PM By: Zandra Abts RN, BSN Entered By: Zandra Abts on 01/26/2019 10:14:55 -------------------------------------------------------------------------------- Patient/Caregiver Education Details [name] 1/21/2021andnbsp9:30 Patient Name: Date of Service: J. AM Medical Record Patient Account Number: [id number] Number: Treating RN: Shawn Stall Date of Birth/Gender: 09-24-1955 (63 y.o. M) Other Clinician: Primary Care Physician: Tedd Sias Referring Physician: Physician/Extender: Santo Held in Treatment: 5 Education Assessment Education Provided To: Patient Education Topics Provided Wound/Skin Impairment: Handouts: Skin Care Do's and Dont's Methods: Explain/Verbal Responses: Reinforcements needed Electronic Signature(s) Signed: 01/26/2019 6:20:01 PM By: Shawn Stall Entered By: Shawn Stall on 01/26/2019 10:12:18 -------------------------------------------------------------------------------- Wound Assessment Details Patient Name: Date of Service: Joel, [name] 01/26/2019 9:30 AM Medical Record [id number] Patient Account Number: [id number] Date of Birth/Sex: Treating RN: February 24, 1955 (64 y.o. Elizebeth Koller Primary Care Shareef Eddinger: Nicoletta Ba Other Clinician: Referring Tarus Briski: Treating Lavoy Bernards/Extender:Robson, Lottie Rater, [name] Weeks in Treatment: 5 Wound Status Wound Number: 3 Primary Diabetic Wound/Ulcer of the Lower Etiology: Extremity Wound Location: Toe Great - Medial Wound Status: Open Wounding Event: Blister Comorbid Hypertension, Type  II Diabetes, Date Acquired: 09/06/2018 History: Neuropathy Weeks Of Treatment: 5 Clustered Wound: No Photos Wound Measurements Length: (cm) 0.3 Width: (cm) 0.3 Depth: (cm) 0.3 Area: (cm) 0.071 Volume: (cm) 0.021 Wound Description Classification: Grade 2 Wound Margin:  Well defined, not attached Exudate Amount: Small Exudate Type: Serosanguineous Exudate Color: red, brown Wound Bed Granulation Amount: Large (67-100%) Granulation Quality: Pink Necrotic Amount: None Present (0%) fter Cleansing: No ino No Exposed Structure ed: No ubcutaneous Tissue) Exposed: Yes ed: No ed: No d: No : No % Reduction in Area: 91.9% % Reduction in Volume: 98.6% Epithelialization: Medium (34-66%) Tunneling: No Undermining: No Foul Odor A Slough/Fibr Fascia Expos Fat Layer (S Tendon Expos Muscle Expos Joint Expose Bone Exposed Treatment Notes Wound #3 (Medial Toe Great) 3. Primary Dressing Applied Colle[employer] 4. Secondary Dressing Dry Gauze Roll Gauze Foam 7. Footwear/Offloading device applied Surgical shoe Electronic Signature(s) Signed: 01/26/2019 4:35:12 PM By: Benjaman Kindler EMT/HBOT Signed: 01/30/2019 6:46:58 PM By: Zandra Abts RN, BSN Entered By: Benjaman Kindler on 01/26/2019 15:52:42 -------------------------------------------------------------------------------- Vitals Details Patient Name: Date of Service: [name] 01/26/2019 9:30 AM Medical Record [id number] Patient Account Number: [id number] Date of Birth/Sex: Treating RN: [birth year] (64 y.o. Elizebeth Koller Primary Care Lella Mullany: Nicoletta Ba Other Clinician: Referring Mallie Giambra: Treating Andora Krull/Extender:Robson, Lottie Rater, [name] Weeks in Treatment: 5 Vital Signs Time Taken: 10:15 Temperature (F): 97.9 Height (in): 72 Pulse (bpm): 79 Weight (lbs): 220 Respiratory Rate (breaths/min): 18 Body Mass Index (BMI): 29.8 Blood Pressure (mmHg): 198/82 Capillary Blood Glucose  (mg/dl): 470 Reference Range: 80 - 120 mg / dl Notes glucose per pt report Electronic Signature(s) Signed: 01/30/2019 6:46:58 PM By: Zandra Abts RN, BSN Entered By: Zandra Abts on 01/26/2019 10:22:10

## 2019-02-02 ENCOUNTER — Encounter (HOSPITAL_BASED_OUTPATIENT_CLINIC_OR_DEPARTMENT_OTHER): Payer: BC Managed Care – PPO | Admitting: Internal Medicine

## 2019-02-02 ENCOUNTER — Other Ambulatory Visit: Payer: Self-pay

## 2019-02-02 DIAGNOSIS — E11621 Type 2 diabetes mellitus with foot ulcer: Secondary | ICD-10-CM | POA: Diagnosis not present

## 2019-02-02 NOTE — Progress Notes (Signed)
[name], [name] ([id number]) Visit Report for 02/02/2019 Debridement Details Patient Name: [name], [name] Date of Service: 02/02/2019 11:15 AM Medical Record [id number] Patient Account Number: [id number] Date of Birth/Sex: Treating RN: [birth year] (64 y.o. M) Primary Care Provider: Nicoletta Ba Other Clinician: Referring Provider: Treating Provider/Extender:Dlisa Barnwell, Lottie Rater, [name] Weeks in Treatment: 6 Debridement Performed for Wound #3 Medial Toe Great Assessment: Performed By: Physician Maxwell Caul., MD Debridement Type: Debridement Severity of Tissue Pre Limited to breakdown of skin Debridement: Level of Consciousness (Pre- Awake and Alert procedure): Pre-procedure Verification/Time Out Taken: Yes - 11:40 Start Time: 11:41 Pain Control: Lidocaine 4% Topical Solution Total Area Debrided (L x W): 0.2 (cm) x 0.2 (cm) = 0.04 (cm) Tissue and other material Viable, Non-Viable, Callus, Skin: Dermis , Skin: Epidermis debrided: Level: Skin/Epidermis Debridement Description: Selective/Open Wound Instrument: Curette Bleeding: Minimum Hemostasis Achieved: Pressure End Time: 11:44 Procedural Pain: 0 Post Procedural Pain: 0 Response to Treatment: Procedure was tolerated well Level of Consciousness Awake and Alert (Post-procedure): Post Debridement Measurements of Total Wound Length: (cm) 0.2 Width: (cm) 0.2 Depth: (cm) 0.2 Volume: (cm) 0.006 Character of Wound/Ulcer Post Improved Debridement: Severity of Tissue Post Debridement: Limited to breakdown of skin Post Procedure Diagnosis Same as Pre-procedure Electronic Signature(s) Signed: 02/02/2019 5:54:40 PM By: Baltazar Najjar MD Entered By: Baltazar Najjar on 02/02/2019 12:48:45 -------------------------------------------------------------------------------- HPI Details Patient Name: Date of Service: [name] 02/02/2019 11:15 AM Medical Record [id number] Patient Account  Number: [id number] Date of Birth/Sex: Treating RN: 1955-12-10 (64 y.o. M) Primary Care Provider: Nicoletta Ba Other Clinician: Referring Provider: Treating Provider/Extender:Seleena Reimers, Lottie Rater, [name] Weeks in Treatment: 6 History of Present Illness HPI Description: ADMISSION 12/22/2018 This is a 64 year old type II diabetic patient who was cared for in this clinic from 2012 through 2013 by Dr. Jimmey Ralph for a wound in the same location on the right medial toe. I do not have records from this time however the patient states that he was treated with an advanced treatment product and a prolonged course of total contact casting. He had an MRI of the foot but is unaware whether he had osteomyelitis at that time. He states that sometime toward the beginning of September he noticed a blister in this area with surrounding callus. He saw his primary doctor he was given a course of Augmentin and was put in a surgical shoe. At one point it was felt to be healing nicely. He followed with his primary doctor on 10/19 noted large areas of callus in the wound. Area. He has not been x-rayed or cultured. He has been using topical antibiotic ointment. Past medical history includes type 2 diabetes with polyneuropathy, whitecoat hypertension and hyperlipidemia ABI in our clinic was noncompressible on the right 12/24; culture I did last time showed MRSA. I started him on doxycycline 100 twice daily for 10 days on 12/21. His MRI is scheduled for January 1. I am still going to use silver alginate to this wound and we will see him back after the MRI in 10 days 01/12/2019 patient arrives with considerable amount of callus over the wound. his MRI is not done because of issues with the creatinine although this was normal in October. He is using a Darco forefoot offloading 1/14; his MRI showed a small plantar ulceration at the medial base of the great toe with adjacent soft tissue enhancement consistent with  cellulitis but no evidence of underlying osteomyelitis. This is obviously relieving for a wound that initially probe to bone. This is a recurrence  in the same area. We have been using collagen 1/21; the patient is tolerating the antibiotic I gave him last week doxycycline. We have been using collagen. The wound is come down in size still with 3 mm of depth. He is using a forefoot off loader 1/28; the patient has completed antibiotics. We have been using silver collagen. The wound has come in quite a bit. Electronic Signature(s) Signed: 02/02/2019 5:54:40 PM By: Linton Ham MD Entered By: Linton Ham on 02/02/2019 12:52:28 -------------------------------------------------------------------------------- Physical Exam Details Patient Name: Date of Service: FORDYCE, LEPAK 02/02/2019 11:15 AM Medical Record [id number] Patient Account Number: [id number] Date of Birth/Sex: Treating RN: 08-03-1955 (64 y.o. M) Primary Care Provider: Shawnie Dapper Other Clinician: Referring Provider: Treating Provider/Extender:Azuri Bozard, [name], [name] Weeks in Treatment: 6 Constitutional Patient is hypertensive.. Pulse regular and within target range for patient.Marland Kitchen Respirations regular, non-labored and within target range.. Temperature is normal and within the target range for the patient.Marland Kitchen Appears in no distress. Notes Wound exam; the area in question is on the medial part of the right great toe. Skin and callus removed with a #3 curette. There is a very small wound here. Probably still 2 mm in depth. Everything looks healthy Electronic Signature(s) Signed: 02/02/2019 5:54:40 PM By: Linton Ham MD Entered By: Linton Ham on 02/02/2019 12:55:53 -------------------------------------------------------------------------------- Physician Orders Details Patient Name: Date of Service: JAMEAR, CARBONNEAU 02/02/2019 11:15 AM Medical Record [id number] Patient Account Number:  [id number] Date of Birth/Sex: Treating RN: [birth year] (64 y.o. Hessie Diener Primary Care Provider: Shawnie Dapper Other Clinician: Referring Provider: Treating Provider/Extender:Nathaneal Sommers, [name], [name] Weeks in Treatment: 6 Verbal / Phone Orders: No Diagnosis Coding ICD-10 Coding Code Description E11.621 Type 2 diabetes mellitus with foot ulcer L97.514 Non-pressure chronic ulcer of other part of right foot with necrosis of bone A49.02 Methicillin resistant Staphylococcus aureus infection, unspecified site Follow-up Appointments Return Appointment in 1 week. - Thursday Dressing Change Frequency Wound #3 Medial Toe Great Change dressing every day. Wound Cleansing Wound #3 Medial Toe Great May shower and wash wound with soap and water. Primary Wound Dressing Wound #3 Medial Toe Great Silver Collagen - moisten with hydrogel or KY jelly Secondary Dressing Wound #3 Medial Toe Great Kerlix/Rolled Gauze Dry Gauze Edema Control Elevate legs to the level of the heart or above for 30 minutes daily and/or when sitting, a frequency of: - 3-4 times a day throughout the day. Other: - limit walking and standing while having the wound. Off-Loading Wound #3 Medial Toe Great Wedge shoe to: - front offloading wedge shoe use while walking and standing. Additional Orders / Instructions Follow Nutritious Diet - increase protein and vegetables within diabetic diet. closely monitor blood glucose. Electronic Signature(s) Signed: 02/02/2019 5:54:40 PM By: Linton Ham MD Signed: 02/02/2019 6:05:55 PM By: Deon Pilling Entered By: Deon Pilling on 02/02/2019 11:45:08 -------------------------------------------------------------------------------- Problem List Details Patient Name: Date of Service: WHITNEY, BINGAMAN 02/02/2019 11:15 AM Medical Record [id number] Patient Account Number: [id number] Date of Birth/Sex: Treating RN: 10-Nov-1955 (64 y.o. Hessie Diener Primary  Care Provider: Shawnie Dapper Other Clinician: Referring Provider: Treating Provider/Extender:Jamaia Brum, [name], [name] Weeks in Treatment: 6 Active Problems ICD-10 Evaluated Encounter Code Description Active Date Today Diagnosis E11.621 Type 2 diabetes mellitus with foot ulcer 12/22/2018 No Yes L97.514 Non-pressure chronic ulcer of other part of right foot 12/22/2018 No Yes with necrosis of bone A49.02 Methicillin resistant Staphylococcus aureus infection, 12/29/2018 No Yes unspecified site Inactive Problems Resolved Problems Electronic Signature(s) Signed: 02/02/2019 5:54:40 PM By: Linton Ham  MD Entered By: Baltazar Najjarobson, Kinzley Savell on 02/02/2019 12:48:24 -------------------------------------------------------------------------------- Progress Note Details Patient Name: Date of Service: Neta EhlersWILLIAMS, Victorio J. 02/02/2019 11:15 AM Medical Record [id number] Patient Account Number: [id number] Date of Birth/Sex: Treating RN: [birth year] (64 y.o. M) Primary Care Provider: Nicoletta BaMCGOWEN, [name] Other Clinician: Referring Provider: Treating Provider/Extender:Yosselyn Tax, Lottie RaterMichael MCGOWEN, [name] Weeks in Treatment: 6 Subjective History of Present Illness (HPI) ADMISSION 12/22/2018 This is a 64 year old type II diabetic patient who was cared for in this clinic from 2012 through 2013 by Dr. Jimmey RalphParker for a wound in the same location on the right medial toe. I do not have records from this time however the patient states that he was treated with an advanced treatment product and a prolonged course of total contact casting. He had an MRI of the foot but is unaware whether he had osteomyelitis at that time. He states that sometime toward the beginning of September he noticed a blister in this area with surrounding callus. He saw his primary doctor he was given a course of Augmentin and was put in a surgical shoe. At one point it was felt to be healing nicely. He followed with his primary doctor  on 10/19 noted large areas of callus in the wound. Area. He has not been x-rayed or cultured. He has been using topical antibiotic ointment. Past medical history includes type 2 diabetes with polyneuropathy, whitecoat hypertension and hyperlipidemia ABI in our clinic was noncompressible on the right 12/24; culture I did last time showed MRSA. I started him on doxycycline 100 twice daily for 10 days on 12/21. His MRI is scheduled for January 1. I am still going to use silver alginate to this wound and we will see him back after the MRI in 10 days 01/12/2019 patient arrives with considerable amount of callus over the wound. his MRI is not done because of issues with the creatinine although this was normal in October. He is using a Darco forefoot offloading 1/14; his MRI showed a small plantar ulceration at the medial base of the great toe with adjacent soft tissue enhancement consistent with cellulitis but no evidence of underlying osteomyelitis. This is obviously relieving for a wound that initially probe to bone. This is a recurrence in the same area. We have been using collagen 1/21; the patient is tolerating the antibiotic I gave him last week doxycycline. We have been using collagen. The wound is come down in size still with 3 mm of depth. He is using a forefoot off loader 1/28; the patient has completed antibiotics. We have been using silver collagen. The wound has come in quite a bit. Objective Constitutional Patient is hypertensive.. Pulse regular and within target range for patient.Marland Kitchen. Respirations regular, non-labored and within target range.. Temperature is normal and within the target range for the patient.Marland Kitchen. Appears in no distress. Vitals Time Taken: 11:25 AM, Height: 72 in, Weight: 220 lbs, BMI: 29.8, Temperature: 98.1 F, Pulse: 96 bpm, Respiratory Rate: 18 breaths/min, Blood Pressure: 181/82 mmHg, Capillary Blood Glucose: 130 mg/dl. General Notes: glucose per pt report General Notes:  Wound exam; the area in question is on the medial part of the right great toe. Skin and callus removed with a #3 curette. There is a very small wound here. Probably still 2 mm in depth. Everything looks healthy Integumentary (Hair, Skin) Wound #3 status is Open. Original cause of wound was Blister. The wound is located on the Medial Toe Great. The wound measures 0.2cm length x 0.2cm width x 0.3cm depth; 0.031cm^2 area and 0.009cm^3  volume. There is Fat Layer (Subcutaneous Tissue) Exposed exposed. There is no tunneling noted, however, there is undermining starting at 12:00 and ending at 12:00 with a maximum distance of 0.2cm. There is a small amount of serosanguineous drainage noted. The wound margin is well defined and not attached to the wound base. There is large (67-100%) pink granulation within the wound bed. There is no necrotic tissue within the wound bed. Assessment Active Problems ICD-10 Type 2 diabetes mellitus with foot ulcer Non-pressure chronic ulcer of other part of right foot with necrosis of bone Methicillin resistant Staphylococcus aureus infection, unspecified site Procedures Wound #3 Pre-procedure diagnosis of Wound #3 is a Diabetic Wound/Ulcer of the Lower Extremity located on the Medial Toe Great .Severity of Tissue Pre Debridement is: Limited to breakdown of skin. There was a Selective/Open Wound Skin/Epidermis Debridement with a total area of 0.04 sq cm performed by Maxwell Caul., MD. With the following instrument(s): Curette to remove Viable and Non-Viable tissue/material. Material removed includes Callus, Skin: Dermis, and Skin: Epidermis after achieving pain control using Lidocaine 4% Topical Solution. A time out was conducted at 11:40, prior to the start of the procedure. A Minimum amount of bleeding was controlled with Pressure. The procedure was tolerated well with a pain level of 0 throughout and a pain level of 0 following the procedure. Post Debridement  Measurements: 0.2cm length x 0.2cm width x 0.2cm depth; 0.006cm^3 volume. Character of Wound/Ulcer Post Debridement is improved. Severity of Tissue Post Debridement is: Limited to breakdown of skin. Post procedure Diagnosis Wound #3: Same as Pre-Procedure Plan Follow-up Appointments: Return Appointment in 1 week. - Thursday Dressing Change Frequency: Wound #3 Medial Toe Great: Change dressing every day. Wound Cleansing: Wound #3 Medial Toe Great: May shower and wash wound with soap and water. Primary Wound Dressing: Wound #3 Medial Toe Great: Silver Collagen - moisten with hydrogel or KY jelly Secondary Dressing: Wound #3 Medial Toe Great: Kerlix/Rolled Gauze Dry Gauze Edema Control: Elevate legs to the level of the heart or above for 30 minutes daily and/or when sitting, a frequency of: - 3-4 times a day throughout the day. Other: - limit walking and standing while having the wound. Off-Loading: Wound #3 Medial Toe Great: Wedge shoe to: - front offloading wedge shoe use while walking and standing. Additional Orders / Instructions: Follow Nutritious Diet - increase protein and vegetables within diabetic diet. closely monitor blood glucose. 1. At this point the wound looks as though it is contracting. 2. Continue silver collagen 3. As this wound has improved I do not feel the need for a total contact cast at this point Electronic Signature(s) Signed: 02/02/2019 5:54:40 PM By: Baltazar Najjar MD Entered By: Baltazar Najjar on 02/02/2019 13:01:50 -------------------------------------------------------------------------------- SuperBill Details Patient Name: Date of Service: [name] 02/02/2019 Medical Record [id number] Patient Account Number: [id number] Date of Birth/Sex: Treating RN: Dec 06, 1955 (64 y.o. [name] Primary Care Provider: Nicoletta Ba Other Clinician: Referring Provider: Treating Provider/Extender:Rip Hawes, Lottie Rater,  [name] Weeks in Treatment: 6 Diagnosis Coding ICD-10 Codes Code Description E11.621 Type 2 diabetes mellitus with foot ulcer L97.514 Non-pressure chronic ulcer of other part of right foot with necrosis of bone A49.02 Methicillin resistant Staphylococcus aureus infection, unspecified site Facility Procedures CPT4 Code Description: [id number] - DEBRIDE WOUND 1ST 20 SQ CM OR < ICD-10 Diagnosis Description L97.514 Non-pressure chronic ulcer of other part of right foot wi E11.621 Type 2 diabetes mellitus with foot ulcer Modifier: th necrosis of Quantity: 1 bone Physician Procedures CPT4  Code Description: [id number] - WC PHYS DEBR WO ANESTH 20 SQ CM ICD-10 Diagnosis Description L97.514 Non-pressure chronic ulcer of other part of right foot wit E11.621 Type 2 diabetes mellitus with foot ulcer Modifier: h necrosis of Quantity: 1 bone Electronic Signature(s) Signed: 02/02/2019 5:54:40 PM By: Baltazar Najjar MD Entered By: Baltazar Najjar on 02/02/2019 13:04:08

## 2019-02-06 NOTE — Progress Notes (Addendum)
ETHEL, VERONICA ([id number]) Visit Report for 02/02/2019 Arrival Information Details Patient Name: Date of Service: Joel, [name] 02/02/2019 11:15 [name] Medical Record [id number] Patient Account Number: [id number] Date of Birth/Sex: Treating RN: 12/19/1955 (64 y.o. [name] Primary Care Genecis Veley: Nicoletta Ba Other Clinician: Referring Ky Rumple: Treating [place] Landgrebe/Extender:Robson, Lottie Rater, [name] Weeks in Treatment: 6 Visit Information History Since Last Visit Added or deleted any medications: No Patient Arrived: Ambulatory Any new allergies or adverse reactions: No Arrival Time: 11:20 Had a fall or experienced change in No Accompanied By: alone activities of daily living that may affect Transfer Assistance: None risk of falls: Patient Identification Verified: Yes Signs or symptoms of abuse/neglect since No Secondary Verification Process Yes last visito Completed: Hospitalized since last visit: No Patient Requires Transmission- No Implantable device outside of the clinic No Based Precautions: excluding Patient Has Alerts: Yes cellular tissue based products placed in the Patient Alerts: Right ABI: non center compress since last visit: Has Dressing in Place as Prescribed: Yes Has Footwear/Offloading in Place as Yes Prescribed: Right: Wedge Shoe Pain Present Now: No Electronic Signature(s) Signed: 02/06/2019 6:39:44 PM By: Zandra Abts RN, BSN Entered By: Zandra Abts on 02/02/2019 11:25:48 -------------------------------------------------------------------------------- Encounter Discharge Information Details Patient Name: Date of Service: [name] 02/02/2019 11:15 [name] Medical Record [id number] Patient Account Number: [id number] Date of Birth/Sex: Treating RN: 02-05-1955 (64 y.o. [name] Primary Care Maria Coin: Nicoletta Ba Other Clinician: Referring Phat Dalton: Treating [place] Serpe/Extender:Robson,  Lottie Rater, [name] Weeks in Treatment: 6 Encounter Discharge Information Items Post Procedure Vitals Discharge Condition: Stable Temperature (F): 98.1 Ambulatory Status: Ambulatory Pulse (bpm): 96 Discharge Destination: Home Respiratory Rate (breaths/min): 18 Transportation: Private Auto Blood Pressure (mmHg): 153/93 Accompanied By: alone Schedule Follow-up Appointment: Yes Clinical Summary of Care: Patient Declined Electronic Signature(s) Signed: 02/06/2019 6:39:44 PM By: Zandra Abts RN, BSN Entered By: Zandra Abts on 02/02/2019 11:57:23 -------------------------------------------------------------------------------- Lower Extremity Assessment Details Patient Name: Date of Service: [name] 02/02/2019 11:15 [name] Medical Record [id number] Patient Account Number: [id number] Date of Birth/Sex: Treating RN: 10/17/1955 (64 y.o. [name] Primary Care Daeshaun Specht: Nicoletta Ba Other Clinician: Referring Sarya Linenberger: Treating Adiah Guereca/Extender:Robson, Lottie Rater, [name] Weeks in Treatment: 6 Edema Assessment Assessed: [Left: No] [Right: No] Edema: [Left: N] [Right: o] Calf Left: Right: Point of Measurement: 35 cm From Medial Instep cm 39.4 cm Ankle Left: Right: Point of Measurement: 7 cm From Medial Instep cm 24 cm Vascular Assessment Pulses: Dorsalis Pedis Palpable: [Right:Yes] Electronic Signature(s) Signed: 02/06/2019 6:39:44 PM By: Zandra Abts RN, BSN Entered By: Zandra Abts on 02/02/2019 11:28:34 -------------------------------------------------------------------------------- Multi Wound Chart Details Patient Name: Date of Service: [name] 02/02/2019 11:15 [name] Medical Record [id number] Patient Account Number: [id number] Date of Birth/Sex: Treating RN: [birth year] (64 y.o. 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] Comorbid History: [3:Hypertension, Type II Diabetes, Neuropathy] [N/A:N/A] Date Acquired: [3:09/06/2018] [N/A:N/A] Weeks of Treatment: [3:6] [N/A:N/A] Wound Status: [3:Open] [N/A:N/A] Measurements L x W x D 0.2x0.2x0.3 [N/A:N/A] (cm) Area (cm) : [3:0.031] [N/A:N/A] Volume (cm) : [3:0.009] [N/A:N/A] % Reduction in Area: [3:96.50%] [N/A:N/A] % Reduction in Volume: 99.40% [N/A:N/A] Starting Position 1 12 (o'clock): Ending Position 1 [3:12] (o'clock): Maximum  Distance 1 [3:0.2] (cm): Undermining: [3:Yes] [N/A:N/A] Classification: [3:Grade 2] [N/A:N/A] Exudate Amount: [3:Small] [N/A:N/A] Exudate Type: [3:Serosanguineous] [N/A:N/A] Exudate Color: [3:red, brown] [N/A:N/A] Wound Margin: [3:Well defined, not attached N/A] Granulation Amount: [3:Large (67-100%)] [N/A:N/A] Granulation Quality: [3:Pink] [N/A:N/A] Necrotic Amount: [3:None Present (0%)] [N/A:N/A] Exposed Structures: [3:Fat Layer (Subcutaneous N/A Tissue) Exposed: Yes Fascia: No Tendon: No Muscle: No Joint: No Bone: No] Epithelialization: [3:Medium (34-66%)] [N/A:N/A] Debridement: [3:Debridement - Selective/Open Wound] [N/A:N/A] Pre-procedure [3:11:40] [N/A:N/A] Verification/Time Out Taken: Pain Control: [3:Lidocaine 4% Topical Solution] [N/A:N/A] Tissue Debrided: [3:Callus] [N/A:N/A] Level: [3:Skin/Epidermis] [N/A:N/A] Debridement Area (sq cm):0.04 [N/A:N/A] Instrument: [3:Curette] [N/A:N/A] Bleeding: [3:Minimum] [N/A:N/A] Hemostasis Achieved: [3:Pressure] [N/A:N/A] Procedural Pain: [3:0]  [N/A:N/A] Post Procedural Pain: [3:0] [N/A:N/A] Debridement Treatment Procedure was tolerated [N/A:N/A] Response: [3:well] Post Debridement [3:0.2x0.2x0.2] [N/A:N/A] Measurements L x W x D (cm) Post Debridement [3:0.006] [N/A:N/A] Volume: (cm) Procedures Performed: Debridement [N/A:N/A] Treatment Notes Wound #3 (Medial Toe Great) 1. Cleanse With Wound Cleanser 3. Primary Dressing Applied Collegen AG 4. Secondary Dressing Dry Gauze Roll Gauze 5. Secured With Tape 7. Footwear/Offloading device applied Wedge shoe Electronic Signature(s) Signed: 02/02/2019 5:54:40 PM By: Baltazar Najjar MD Entered By: Baltazar Najjar on 02/02/2019 12:48:35 -------------------------------------------------------------------------------- Multi-Disciplinary Care Plan Details Patient Name: Date of Service: [name]. 02/02/2019 11:15 [name] Medical Record [id number] Patient Account Number: [id number] Date of Birth/Sex: Treating RN: 11-11-1955 (64 y.o. [name] Primary Care Tatyana Biber: Nicoletta Ba Other Clinician: Referring Shamera Yarberry: Treating Devlynn Knoff/Extender:Robson, Lottie Rater, [name] Weeks in Treatment: 6 Active Inactive Wound/Skin Impairment Nursing Diagnoses: Knowledge deficit related to ulceration/compromised skin integrity Goals: Patient/caregiver will verbalize understanding of skin care regimen Date Initiated: 12/22/2018 Target Resolution Date: 04/07/2019 Goal Status: Active Interventions: Assess patient/caregiver ability to obtain necessary supplies Assess patient/caregiver ability to perform ulcer/skin care regimen upon admission and as needed Assess ulceration(s) every visit Provide education on ulcer and skin care Treatment Activities: Skin care regimen initiated : 12/22/2018 Topical wound management initiated : 12/22/2018 Notes: Electronic Signature(s) Signed: 02/02/2019 6:05:55 PM By: Shawn Stall Entered By: Shawn Stall on 02/02/2019  11:40:09 -------------------------------------------------------------------------------- Pain Assessment Details Patient Name: Date of Service: Joel, [name] 02/02/2019 11:15 [name] Medical Record [id number] Patient Account Number: [id number] Date of Birth/Sex: Treating RN: August 28, 1955 (64 y.o. [name] Primary Care Fairy Ashlock: Nicoletta Ba Other Clinician: Referring Renaye Janicki: Treating Nastassja Witkop/Extender:Robson, Lottie Rater, [name] Weeks in Treatment: 6 Active Problems Location of Pain Severity and Description of Pain Patient Has Paino No Site Locations Pain Management and Medication Current Pain Management: Electronic Signature(s) Signed: 02/06/2019 6:39:44 PM By: Zandra Abts RN, BSN Entered By: Zandra Abts on 02/02/2019 11:21:53 -------------------------------------------------------------------------------- Patient/Caregiver Education Details [name] 1/28/2021andnbsp11:15 Patient Name: Date of Service: [name] Medical Record Patient Account Number: [id number] Number: Treating RN: Shawn Stall Date of Birth/Gender: Mar 07, 1955 (63 y.o. M) Other Clinician: Primary Care Physician:MCGOWEN, [name] Treating Baltazar Najjar Referring Physician: Physician/Extender: Santo Held in Treatment: 6 Education Assessment Education Provided To: Patient Education Topics Provided Wound/Skin Impairment: Handouts: Skin Care Do's and Dont's Methods: Explain/Verbal Responses: Reinforcements needed Electronic Signature(s) Signed: 02/02/2019 6:05:55 PM By: Shawn Stall Entered By: Shawn Stall on 02/02/2019 11:40:35 -------------------------------------------------------------------------------- Wound Assessment Details Patient Name: Date of Service: Joel, [name] 02/02/2019 11:15 [name] Medical Record [id number] Patient Account Number: [id number] Date of Birth/Sex: Treating RN: Oct 03, 1955 (64 y.o. M) Primary Care  Airiana Elman: Nicoletta Ba Other Clinician: Referring Kellen Hover: Treating Aseel Truxillo/Extender:Robson, Lottie Rater, [name] Weeks in Treatment: 6 Wound Status Wound Number: 3 Primary Diabetic Wound/Ulcer of the Lower Etiology: Extremity Wound Location: Toe Great - Medial Wound  Status: Open Wounding Event: Blister Comorbid Hypertension, Type II Diabetes, Date Acquired: 09/06/2018 History: Neuropathy Weeks Of Treatment: 6 Clustered Wound: No Photos Wound Measurements Length: (cm) 0.2 % Reduction in A Width: (cm) 0.2 % Reduction in V Depth: (cm) 0.3 Epithelializatio Area: (cm) 0.031 Tunneling: Volume: (cm) 0.009 Undermining: Starting Posi Ending Positi Maximum Dista rea: 96.5% olume: 99.4% n: Medium (34-66%) No Yes tion (o'clock): 12 on (o'clock): 12 nce: (cm) 0.2 Wound Description Classification: Grade 2 Foul Odor After Wound Margin: Well defined, not attached Slough/Fibrino Exudate Amount: Small Exudate Type: Serosanguineous Exudate Color: red, brown Wound Bed Granulation Amount: Large (67-100%) Granulation Quality: Pink Fascia Exposed: Necrotic Amount: None Present (0%) Fat Layer (Subcu Tendon Exposed: Muscle Exposed: Joint Exposed: Bone Exposed: Treatment Notes Wound #3 (Medial Toe Great) 1. Cleanse With Wound Cleanser 3. Primary Dressing Applied Collegen AG 4. Secondary Dressing Dry Gauze Roll Gauze 5. Secured With Tape 7. Footwear/Offloading device applied Wedge shoe Cleansing: No No Exposed Structure No taneous Tissue) Exposed: Yes No No No No Electronic Signature(s) Signed: 02/10/2019 4:37:46 PM By: Mikeal Hawthorne EMT/HBOT Previous Signature: 02/06/2019 6:39:44 PM Version By: Levan Hurst RN, BSN Entered By: Mikeal Hawthorne on 02/10/2019 16:14:42 -------------------------------------------------------------------------------- Vitals Details Patient Name: Date of Service: [name] 02/02/2019 11:15 [name] Medical Record  [id number] Patient Account Number: [id number] Date of Birth/Sex: Treating RN: 28-Feb-1955 (64 y.o. Janyth Contes Primary Care Arminda Foglio: Shawnie Dapper Other Clinician: Referring Chloe Flis: Treating Trishia Cuthrell/Extender:Robson, [name], [name] Weeks in Treatment: 6 Vital Signs Time Taken: 11:25 Temperature (F): 98.1 Height (in): 72 Pulse (bpm): 96 Weight (lbs): 220 Respiratory Rate (breaths/min): 18 Body Mass Index (BMI): 29.8 Blood Pressure (mmHg): 181/82 Capillary Blood Glucose (mg/dl): 130 Reference Range: 80 - 120 mg / dl Notes glucose per pt report Electronic Signature(s) Signed: 02/06/2019 6:39:44 PM By: Levan Hurst RN, BSN Entered By: Levan Hurst on 02/02/2019 11:28:27

## 2019-02-07 ENCOUNTER — Encounter: Payer: BC Managed Care – PPO | Admitting: Family Medicine

## 2019-02-09 ENCOUNTER — Encounter (HOSPITAL_BASED_OUTPATIENT_CLINIC_OR_DEPARTMENT_OTHER): Payer: BC Managed Care – PPO | Attending: Internal Medicine | Admitting: Internal Medicine

## 2019-02-09 ENCOUNTER — Other Ambulatory Visit (HOSPITAL_COMMUNITY)
Admit: 2019-02-09 | Discharge: 2019-02-09 | Disposition: A | Discharge status: Home / Self Care | Payer: BC Managed Care – PPO | Source: Ambulatory Visit | Attending: Internal Medicine | Admitting: Internal Medicine

## 2019-02-09 ENCOUNTER — Other Ambulatory Visit: Payer: Self-pay

## 2019-02-09 DIAGNOSIS — E11621 Type 2 diabetes mellitus with foot ulcer: Secondary | ICD-10-CM | POA: Insufficient documentation

## 2019-02-09 DIAGNOSIS — B957 Other staphylococcus as the cause of diseases classified elsewhere: Secondary | ICD-10-CM | POA: Insufficient documentation

## 2019-02-09 DIAGNOSIS — L97514 Non-pressure chronic ulcer of other part of right foot with necrosis of bone: Secondary | ICD-10-CM | POA: Diagnosis not present

## 2019-02-09 NOTE — Progress Notes (Addendum)
BURLEIGH, BROCKMANN ([id number]) Visit Report for 02/09/2019 Debridement Details Patient Name: Joel, [name] Date of Service: 02/09/2019 9:30 AM Medical Record [id number] Patient Account Number: [id number] Date of Birth/Sex: Treating RN: 31-Aug-1955 (64 y.o. M) Primary Care Provider: Nicoletta Ba Other Clinician: Referring Provider: Treating Provider/Extender:Ermel Verne, Lottie Rater, [name] Weeks in Treatment: 7 Debridement Performed for Wound #3 Medial Toe Great Assessment: Performed By: Physician Maxwell Caul., MD Debridement Type: Debridement Severity of Tissue Pre Limited to breakdown of skin Debridement: Level of Consciousness (Pre- Awake and Alert procedure): Pre-procedure Verification/Time Out Taken: Yes - 10:30 Start Time: 10:31 Pain Control: Lidocaine 4% Topical Solution Total Area Debrided (L x W): 0.4 (cm) x 0.4 (cm) = 0.16 (cm) Tissue and other material Viable, Non-Viable, Callus, Subcutaneous, Skin: Dermis , Fibrin/Exudate debrided: Level: Skin/Subcutaneous Tissue Debridement Description: Excisional Instrument: Curette Specimen: Swab, Number of Specimens Taken: 1 Bleeding: Minimum Hemostasis Achieved: Pressure End Time: 10:37 Procedural Pain: 0 Post Procedural Pain: 0 Response to Treatment: Procedure was tolerated well Level of Consciousness Awake and Alert (Post-procedure): Post Debridement Measurements of Total Wound Length: (cm) 0.3 Width: (cm) 0.2 Depth: (cm) 0.2 Volume: (cm) 0.009 Character of Wound/Ulcer Post Improved Debridement: Severity of Tissue Post Debridement: Limited to breakdown of skin Post Procedure Diagnosis Same as Pre-procedure Electronic Signature(s) Signed: 02/09/2019 5:50:02 PM By: Baltazar Najjar MD Entered By: Baltazar Najjar on 02/09/2019 10:47:40 -------------------------------------------------------------------------------- HPI Details Patient Name: Date of Service: [name] 02/09/2019  9:30 AM Medical Record [id number] Patient Account Number: [id number] Date of Birth/Sex: Treating RN: 1955-01-23 (64 y.o. M) Primary Care Provider: Nicoletta Ba Other Clinician: Referring Provider: Treating Provider/Extender:Latesha Chesney, Lottie Rater, [name] Weeks in Treatment: 7 History of Present Illness HPI Description: ADMISSION 12/22/2018 This is a 64 year old type II diabetic patient who was cared for in this clinic from 2012 through 2013 by Dr. Jimmey Ralph for a wound in the same location on the right medial toe. I do not have records from this time however the patient states that he was treated with an advanced treatment product and a prolonged course of total contact casting. He had an MRI of the foot but is unaware whether he had osteomyelitis at that time. He states that sometime toward the beginning of September he noticed a blister in this area with surrounding callus. He saw his primary doctor he was given a course of Augmentin and was put in a surgical shoe. At one point it was felt to be healing nicely. He followed with his primary doctor on 10/19 noted large areas of callus in the wound. Area. He has not been x-rayed or cultured. He has been using topical antibiotic ointment. Past medical history includes type 2 diabetes with polyneuropathy, whitecoat hypertension and hyperlipidemia ABI in our clinic was noncompressible on the right 12/24; culture I did last time showed MRSA. I started him on doxycycline 100 twice daily for 10 days on 12/21. His MRI is scheduled for January 1. I am still going to use silver alginate to this wound and we will see him back after the MRI in 10 days 01/12/2019 patient arrives with considerable amount of callus over the wound. his MRI is not done because of issues with the creatinine although this was normal in October. He is using a Darco forefoot offloading 1/14; his MRI showed a small plantar ulceration at the medial base of the great toe with  adjacent soft tissue enhancement consistent with cellulitis but no evidence of underlying osteomyelitis. This is obviously relieving for a wound that initially  probe to bone. This is a recurrence in the same area. We have been using collagen 1/21; the patient is tolerating the antibiotic I gave him last week doxycycline. We have been using collagen. The wound is come down in size still with 3 mm of depth. He is using a forefoot off loader 1/28; the patient has completed antibiotics. We have been using silver collagen. The wound has come in quite a bit. 2/4; the patient arrived in clinic today with the silver collagen somewhat adherent to the wound. After the intake nurse remove the dressing there was some purulence which she cultured. Electronic Signature(s) Signed: 02/09/2019 5:50:02 PM By: Baltazar Najjar MD Entered By: Baltazar Najjar on 02/09/2019 10:51:02 -------------------------------------------------------------------------------- Physical Exam Details Patient Name: Date of Service: [name] 02/09/2019 9:30 AM Medical Record [id number] Patient Account Number: [id number] Date of Birth/Sex: Treating RN: [birth year] (64 y.o. M) Primary Care Provider: Nicoletta Ba Other Clinician: Referring Provider: Treating Provider/Extender:Orvin Netter, Lottie Rater, [name] Weeks in Treatment: 7 Constitutional Patient is hypertensive.. Pulse regular and within target range for patient.Marland Kitchen Respirations regular, non-labored and within target range.. Temperature is normal and within the target range for the patient.Marland Kitchen Appears in no distress. Notes Wound exam; the area in question had a lot of callus and skin around the margins of the wound which was could not really see the totality of. Using a #3 curette I removed callus skin and some subcutaneous debris from the wound surface this cleans up quite nicely and debriding this I did not really see anything that looked  particularly infectious. I washed the wound off with Anasept and gauze. Electronic Signature(s) Signed: 02/09/2019 5:50:02 PM By: Baltazar Najjar MD Entered By: Baltazar Najjar on 02/09/2019 10:51:54 -------------------------------------------------------------------------------- Physician Orders Details Patient Name: Date of Service: [name], [name] 02/09/2019 9:30 AM Medical Record [id number] Patient Account Number: [id number] Date of Birth/Sex: Treating RN: September 20, 1955 (64 y.o. [name] Primary Care Provider: Nicoletta Ba Other Clinician: Referring Provider: Treating Provider/Extender:Jocee Kissick, Lottie Rater, [name] Weeks in Treatment: 7 Verbal / Phone Orders: No Diagnosis Coding ICD-10 Coding Code Description E11.621 Type 2 diabetes mellitus with foot ulcer L97.514 Non-pressure chronic ulcer of other part of right foot with necrosis of bone A49.02 Methicillin resistant Staphylococcus aureus infection, unspecified site Follow-up Appointments Return Appointment in 1 week. - Thursday Dressing Change Frequency Wound #3 Medial Toe Great Change dressing every day. Wound Cleansing Wound #3 Medial Toe Great May shower and wash wound with soap and water. Primary Wound Dressing Wound #3 Medial Toe Great Calcium Alginate with Silver Secondary Dressing Wound #3 Medial Toe Great Kerlix/Rolled Gauze Dry Gauze Edema Control Elevate legs to the level of the heart or above for 30 minutes daily and/or when sitting, a frequency of: - 3-4 times a day throughout the day. Other: - limit walking and standing while having the wound. Off-Loading Wound #3 Medial Toe Great Wedge shoe to: - front offloading wedge shoe use while walking and standing. Additional Orders / Instructions Follow Nutritious Diet - increase protein and vegetables within diabetic diet. closely monitor blood glucose. Laboratory Bacteria identified in Unspecified specimen by Aerobe culture (MICRO) -  great toe wound. LOINC Code: 634-6 Convenience Name: Areobic culture-specimen not specified Electronic Signature(s) Signed: 02/13/2019 9:45:43 AM By: Shawn Stall Signed: 02/13/2019 6:01:37 PM By: Baltazar Najjar MD Previous Signature: 02/09/2019 5:50:02 PM Version By: Baltazar Najjar MD Previous Signature: 02/09/2019 5:56:18 PM Version By: Shawn Stall Entered By: Shawn Stall on 02/13/2019 09:45:11 -------------------------------------------------------------------------------- Problem List Details Patient Name: Date of Service:  [name], [name] 02/09/2019 9:30 AM Medical Record [id number] Patient Account Number: [id number] Date of Birth/Sex: Treating RN: [birth year] (64 y.o. [name] Primary Care Provider: Nicoletta Ba Other Clinician: Referring Provider: Treating Provider/Extender:Kimie Pidcock, Lottie Rater, [name] Weeks in Treatment: 7 Active Problems ICD-10 Evaluated Encounter Evaluated Encounter Code Description Active Date Today Diagnosis E11.621 Type 2 diabetes mellitus with foot ulcer 12/22/2018 No Yes L97.514 Non-pressure chronic ulcer of other part of right foot 12/22/2018 No Yes with necrosis of bone A49.02 Methicillin resistant Staphylococcus aureus infection, 12/29/2018 No Yes unspecified site Inactive Problems Resolved Problems Electronic Signature(s) Signed: 02/09/2019 5:50:02 PM By: Baltazar Najjar MD Entered By: Baltazar Najjar on 02/09/2019 10:47:13 -------------------------------------------------------------------------------- Progress Note Details Patient Name: Date of Service: [name] 02/09/2019 9:30 AM Medical Record [id number] Patient Account Number: [id number] Date of Birth/Sex: Treating RN: 1955/06/07 (64 y.o. M) Primary Care Provider: Nicoletta Ba Other Clinician: Referring Provider: Treating Provider/Extender:Diedre Maclellan, Lottie Rater, [name] Weeks in Treatment: 7 Subjective History of Present Illness  (HPI) ADMISSION 12/22/2018 This is a 64 year old type II diabetic patient who was cared for in this clinic from 2012 through 2013 by Dr. Jimmey Ralph for a wound in the same location on the right medial toe. I do not have records from this time however the patient states that he was treated with an advanced treatment product and a prolonged course of total contact casting. He had an MRI of the foot but is unaware whether he had osteomyelitis at that time. He states that sometime toward the beginning of September he noticed a blister in this area with surrounding callus. He saw his primary doctor he was given a course of Augmentin and was put in a surgical shoe. At one point it was felt to be healing nicely. He followed with his primary doctor on 10/19 noted large areas of callus in the wound. Area. He has not been x-rayed or cultured. He has been using topical antibiotic ointment. Past medical history includes type 2 diabetes with polyneuropathy, whitecoat hypertension and hyperlipidemia ABI in our clinic was noncompressible on the right 12/24; culture I did last time showed MRSA. I started him on doxycycline 100 twice daily for 10 days on 12/21. His MRI is scheduled for January 1. I am still going to use silver alginate to this wound and we will see him back after the MRI in 10 days 01/12/2019 patient arrives with considerable amount of callus over the wound. his MRI is not done because of issues with the creatinine although this was normal in October. He is using a Darco forefoot offloading 1/14; his MRI showed a small plantar ulceration at the medial base of the great toe with adjacent soft tissue enhancement consistent with cellulitis but no evidence of underlying osteomyelitis. This is obviously relieving for a wound that initially probe to bone. This is a recurrence in the same area. We have been using collagen 1/21; the patient is tolerating the antibiotic I gave him last week doxycycline. We  have been using collagen. The wound is come down in size still with 3 mm of depth. He is using a forefoot off loader 1/28; the patient has completed antibiotics. We have been using silver collagen. The wound has come in quite a bit. 2/4; the patient arrived in clinic today with the silver collagen somewhat adherent to the wound. After the intake nurse remove the dressing there was some purulence which she cultured. Objective Constitutional Patient is hypertensive.. Pulse regular and within target range for patient.Marland Kitchen Respirations regular, non-labored  and within target range.. Temperature is normal and within the target range for the patient.Marland Kitchen Appears in no distress. Vitals Time Taken: 9:59 AM, Height: 72 in, Weight: 220 lbs, BMI: 29.8, Temperature: 97.7 F, Pulse: 84 bpm, Respiratory Rate: 18 breaths/min, Blood Pressure: 197/92 mmHg, Capillary Blood Glucose: 150 mg/dl. General Notes: glucose per pt report General Notes: Wound exam; the area in question had a lot of callus and skin around the margins of the wound which was could not really see the totality of. Using a #3 curette I removed callus skin and some subcutaneous debris from the wound surface this cleans up quite nicely and debriding this I did not really see anything that looked particularly infectious. I washed the wound off with Anasept and gauze. Integumentary (Hair, Skin) Wound #3 status is Open. Original cause of wound was Blister. The wound is located on the Medial Toe Great. The wound measures 0.3cm length x 0.2cm width x 0.2cm depth; 0.047cm^2 area and 0.009cm^3 volume. There is Fat Layer (Subcutaneous Tissue) Exposed exposed. There is no tunneling or undermining noted. There is a small amount of purulent drainage noted. The wound margin is well defined and not attached to the wound base. There is large (67-100%) pink, pale granulation within the wound bed. There is no necrotic tissue within the wound bed. Assessment Active  Problems ICD-10 Type 2 diabetes mellitus with foot ulcer Non-pressure chronic ulcer of other part of right foot with necrosis of bone Methicillin resistant Staphylococcus aureus infection, unspecified site Procedures Wound #3 Pre-procedure diagnosis of Wound #3 is a Diabetic Wound/Ulcer of the Lower Extremity located on the Medial Toe Great .Severity of Tissue Pre Debridement is: Limited to breakdown of skin. There was a Excisional Skin/Subcutaneous Tissue Debridement with a total area of 0.16 sq cm performed by Maxwell Caul., MD. With the following instrument(s): Curette to remove Viable and Non-Viable tissue/material. Material removed includes Callus, Subcutaneous Tissue, Skin: Dermis, and Fibrin/Exudate after achieving pain control using Lidocaine 4% Topical Solution. 1 specimen was taken by a Swab and sent to the lab per facility protocol. A time out was conducted at 10:30, prior to the start of the procedure. A Minimum amount of bleeding was controlled with Pressure. The procedure was tolerated well with a pain level of 0 throughout and a pain level of 0 following the procedure. Post Debridement Measurements: 0.3cm length x 0.2cm width x 0.2cm depth; 0.009cm^3 volume. Character of Wound/Ulcer Post Debridement is improved. Severity of Tissue Post Debridement is: Limited to breakdown of skin. Post procedure Diagnosis Wound #3: Same as Pre-Procedure Plan Follow-up Appointments: Return Appointment in 1 week. - Thursday Dressing Change Frequency: Wound #3 Medial Toe Great: Change dressing every day. Wound Cleansing: Wound #3 Medial Toe Great: May shower and wash wound with soap and water. Primary Wound Dressing: Wound #3 Medial Toe Great: Calcium Alginate with Silver Secondary Dressing: Wound #3 Medial Toe Great: Kerlix/Rolled Gauze Dry Gauze Edema Control: Elevate legs to the level of the heart or above for 30 minutes daily and/or when sitting, a frequency of: - 3-4 times  a day throughout the day. Other: - limit walking and standing while having the wound. Off-Loading: Wound #3 Medial Toe Great: Wedge shoe to: - front offloading wedge shoe use while walking and standing. Additional Orders / Instructions: Follow Nutritious Diet - increase protein and vegetables within diabetic diet. closely monitor blood glucose. Laboratory ordered were: Areobic culture-specimen not specified - great toe wound. 1. I change the primary dressing to silver alginate  2. Specimen of purulence on the wound surface sent for CandS but no empiric antibiotics 3. I am going to see if the patient requires a total contact cast next week to close this. The wound is small but with some relative depth. 4. As mentioned no obvious infection Electronic Signature(s) Signed: 02/13/2019 9:45:43 AM By: Deon Pilling Signed: 02/13/2019 6:01:37 PM By: Linton Ham MD Previous Signature: 02/09/2019 5:50:02 PM Version By: Linton Ham MD Entered By: Deon Pilling on 02/13/2019 09:45:26 -------------------------------------------------------------------------------- SuperBill Details Patient Name: Date of Service: [name] 02/09/2019 Medical Record [id number] Patient Account Number: [id number] Date of Birth/Sex: Treating RN: 28-Jul-1955 (64 y.o. Hessie Diener Primary Care Provider: Shawnie Dapper Other Clinician: Referring Provider: Treating Provider/Extender:Kenzleigh Sedam, [name], [name] Weeks in Treatment: 7 Diagnosis Coding ICD-10 Codes Code Description E11.621 Type 2 diabetes mellitus with foot ulcer L97.514 Non-pressure chronic ulcer of other part of right foot with necrosis of bone A49.02 Methicillin resistant Staphylococcus aureus infection, unspecified site Facility Procedures CPT4 Code Description: [id number] - DEB SUBQ TISSUE 20 SQ CM/< ICD-10 Diagnosis Description L97.514 Non-pressure chronic ulcer of other part of right foot wit E11.621 Type 2 diabetes  mellitus with foot ulcer Modifier: h necrosis of Quantity: 1 bone Physician Procedures CPT4 Code Description: [id number] - WC PHYS SUBQ TISS 20 SQ CM ICD-10 Diagnosis Description L97.514 Non-pressure chronic ulcer of other part of right foot wit E11.621 Type 2 diabetes mellitus with foot ulcer Modifier: h necrosis of Quantity: 1 bone Electronic Signature(s) Signed: 02/09/2019 5:50:02 PM By: Linton Ham MD Entered By: Linton Ham on 02/09/2019 10:53:00

## 2019-02-11 LAB — AEROBIC CULTURE W GRAM STAIN (SUPERFICIAL SPECIMEN)

## 2019-02-14 NOTE — Progress Notes (Signed)
Joel, [name] ([id number]) Visit Report for 02/09/2019 Arrival Information Details Patient Name: Date of Service: [name], [name] 02/09/2019 9:30 AM Medical Record [id number] Patient Account Number: [id number] Date of Birth/Sex: Treating RN: 10/11/1955 (64 y.o. Elizebeth Koller Primary Care Dayami Taitt: Nicoletta Ba Other Clinician: Referring Monserrath Junio: Treating Johnte Portnoy/Extender:Robson, Lottie Rater, [name] Weeks in Treatment: 7 Visit Information History Since Last Visit Added or deleted any medications: No Patient Arrived: Ambulatory Any new allergies or adverse reactions: No Arrival Time: 09:59 Had a fall or experienced change in No Accompanied By: alone activities of daily living that may affect Transfer Assistance: None risk of falls: Patient Identification Verified: Yes Signs or symptoms of abuse/neglect since No Secondary Verification Process Yes last visito Completed: Hospitalized since last visit: No Patient Requires Transmission- No Implantable device outside of the clinic No Based Precautions: excluding Patient Has Alerts: Yes cellular tissue based products placed in the Patient Alerts: Right ABI: non center compress since last visit: Has Dressing in Place as Prescribed: Yes Has Footwear/Offloading in Place as Yes Prescribed: Right: Wedge Shoe Pain Present Now: No Electronic Signature(s) Signed: 02/14/2019 5:30:42 PM By: Zandra Abts RN, BSN Entered By: Zandra Abts on 02/09/2019 09:59:31 -------------------------------------------------------------------------------- Encounter Discharge Information Details Patient Name: Date of Service: [name] 02/09/2019 9:30 AM Medical Record [id number] Patient Account Number: [id number] Date of Birth/Sex: Treating RN: 1955-08-28 (64 y.o. Damaris Schooner Primary Care Yittel Emrich: Nicoletta Ba Other Clinician: Referring Dae Antonucci: Treating Daaiyah Baumert/Extender:Robson,  Lottie Rater, [name] Weeks in Treatment: 7 Encounter Discharge Information Items Post Procedure Vitals Discharge Condition: Stable Temperature (F): 97.7 Ambulatory Status: Ambulatory Pulse (bpm): 84 Discharge Destination: Home Respiratory Rate (breaths/min): 18 Transportation: Private Auto Blood Pressure (mmHg): 197/92 Accompanied By: self Schedule Follow-up Appointment: Yes Clinical Summary of Care: Patient Declined Electronic Signature(s) Signed: 02/09/2019 5:40:52 PM By: Zenaida Deed RN, BSN Entered By: Zenaida Deed on 02/09/2019 10:54:57 -------------------------------------------------------------------------------- Lower Extremity Assessment Details Patient Name: Date of Service: Joel, [name] 02/09/2019 9:30 AM Medical Record [id number] Patient Account Number: [id number] Date of Birth/Sex: Treating RN: [birth year] (64 y.o. Elizebeth Koller Primary Care Othelia Riederer: Nicoletta Ba Other Clinician: Referring Malcolm Quast: Treating Sharryn Belding/Extender:Robson, Lottie Rater, [name] Weeks in Treatment: 7 Edema Assessment Assessed: [Left: No] [Right: No] Edema: [Left: N] [Right: o] Calf Left: Right: Point of Measurement: 35 cm From Medial Instep cm 39.4 cm Ankle Left: Right: Point of Measurement: 7 cm From Medial Instep cm 24 cm Vascular Assessment Pulses: Dorsalis Pedis Palpable: [Right:Yes] Electronic Signature(s) Signed: 02/14/2019 5:30:42 PM By: Zandra Abts RN, BSN Entered By: Zandra Abts on 02/09/2019 10:00:30 -------------------------------------------------------------------------------- Multi Wound Chart Details Patient Name: Date of Service: [name] 02/09/2019 9:30 AM Medical Record [id number] Patient Account Number: [id number] Date of Birth/Sex: Treating RN: [birth year] (64 y.o. 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] Comorbid History: [3:Hypertension, Type II Diabetes, Neuropathy] [N/A:N/A] Date Acquired: [3:09/06/2018] [N/A:N/A] Weeks of Treatment: [3:7] [N/A:N/A] Wound Status: [3:Open] [N/A:N/A] Measurements L x W x D 0.3x0.2x0.2 [N/A:N/A] (cm) Area (cm) : [3:0.047] [N/A:N/A] Volume (cm) : [3:0.009] [N/A:N/A] % Reduction in Area: [3:94.70%] [N/A:N/A] % Reduction in Volume: 99.40% [N/A:N/A] Classification: [3:Grade 2] [N/A:N/A] Exudate Amount: [3:Small] [N/A:N/A] Exudate Type: [3:Purulent] [  N/A:N/A] Exudate Color: [3:yellow, brown, green] [N/A:N/A] Wound Margin: [3:Well defined, not attached N/A] Granulation Amount: [3:Large (67-100%)] [N/A:N/A] Granulation Quality: [3:Pink, Pale] [N/A:N/A] Necrotic Amount: [3:None Present (0%)] [N/A:N/A] Exposed Structures: [3:Fat Layer (Subcutaneous N/A Tissue) Exposed: Yes Fascia: No Tendon: No Muscle: No Joint: No Bone: No] Epithelialization: [3:Medium (34-66%)] [N/A:N/A] Debridement: [3:Debridement - Selective/Open Wound] [N/A:N/A] Pre-procedure [3:10:30] [N/A:N/A] Verification/Time Out Taken: Pain Control: [3:Lidocaine 4% Topical Solution] [N/A:N/A] Tissue Debrided: [3:Callus] [N/A:N/A] Level: [3:Skin/Dermis] [N/A:N/A] Debridement Area (sq cm):0.16 [N/A:N/A] Instrument: [3:Curette] [N/A:N/A] Specimen: [3:Swab] [N/A:N/A] Number of Specimens [3:1] [N/A:N/A] Taken: Bleeding: [3:Minimum] [N/A:N/A] Hemostasis Achieved: [3:Pressure] [N/A:N/A] Procedural Pain: [3:0] [N/A:N/A] Post Procedural Pain: [3:0] [N/A:N/A] Debridement  Treatment Procedure was tolerated [N/A:N/A] Response: [3:well] Post Debridement [3:0.3x0.2x0.2] [N/A:N/A] Measurements L x W x D (cm) Post Debridement [3:0.009] [N/A:N/A] Volume: (cm) Procedures Performed: Debridement [N/A:N/A] Treatment Notes Electronic Signature(s) Signed: 02/09/2019 5:50:02 PM By: Baltazar Najjar MD Entered By: Baltazar Najjar on 02/09/2019 10:47:21 -------------------------------------------------------------------------------- Multi-Disciplinary Care Plan Details Patient Name: Date of Service: [name]. 02/09/2019 9:30 AM Medical Record [id number] Patient Account Number: [id number] Date of Birth/Sex: Treating RN: 1955-08-04 (64 y.o. [name] Primary Care Veryl Abril: Nicoletta Ba Other Clinician: Referring Trenisha Lafavor: Treating Johnmichael Melhorn/Extender:Robson, Lottie Rater, [name] Weeks in Treatment: 7 Active Inactive Wound/Skin Impairment Nursing Diagnoses: Knowledge deficit related to ulceration/compromised skin integrity Goals: Patient/caregiver will verbalize understanding of skin care regimen Date Initiated: 12/22/2018 Target Resolution Date: 04/07/2019 Goal Status: Active Interventions: Assess patient/caregiver ability to obtain necessary supplies Assess patient/caregiver ability to perform ulcer/skin care regimen upon admission and as needed Assess ulceration(s) every visit Provide education on ulcer and skin care Treatment Activities: Skin care regimen initiated : 12/22/2018 Topical wound management initiated : 12/22/2018 Notes: Electronic Signature(s) Signed: 02/09/2019 5:56:18 PM By: Shawn Stall Entered By: Shawn Stall on 02/09/2019 10:16:41 -------------------------------------------------------------------------------- Pain Assessment Details Patient Name: Date of Service: LISTER, BRIZZI 02/09/2019 9:30 AM Medical Record [id number] Patient Account Number: [id number] Date of Birth/Sex: Treating  RN: 04-Mar-1955 (64 y.o. Elizebeth Koller Primary Care Christ Fullenwider: Nicoletta Ba Other Clinician: Referring Cashlynn Yearwood: Treating Jeoffrey Eleazer/Extender:Robson, Lottie Rater, [name] Weeks in Treatment: 7 Active Problems Location of Pain Severity and Description of Pain Patient Has Paino No Site Locations Pain Management and Medication Current Pain Management: Electronic Signature(s) Signed: 02/14/2019 5:30:42 PM By: Zandra Abts RN, BSN Entered By: Zandra Abts on 02/09/2019 10:00:23 -------------------------------------------------------------------------------- Patient/Caregiver Education Details Patient Name: Date of Service: [name] 2/4/2021andnbsp9:30 AM Medical Record [phone] Patient Account Number: [id number] Date of Birth/Gender: Treating RN: 26-Jul-1955 (63 y.o. [name] Primary Care Physician: Nicoletta Ba Other Clinician: Referring Physician: Treating Physician/Extender:Robson, Lottie Rater, [name] Weeks in Treatment: 7 Education Assessment Education Provided To: Patient Education Topics Provided Wound/Skin Impairment: Handouts: Skin Care Do's and Dont's Methods: Explain/Verbal Responses: Reinforcements needed Electronic Signature(s) Signed: 02/09/2019 5:56:18 PM By: Shawn Stall Entered By: Shawn Stall on 02/09/2019 10:16:50 -------------------------------------------------------------------------------- Wound Assessment Details Patient Name: Date of Service: JACOBIE, STAMEY 02/09/2019 9:30 AM Medical Record [id number] Patient Account Number: [id number] Date of Birth/Sex: Treating RN: [birth year] (64 y.o. M) Primary Care Krrish Freund: Nicoletta Ba Other Clinician: Referring Marium Ragan: Treating Jarrah Babich/Extender:Robson, Lottie Rater, [name] Weeks in Treatment: 7 Wound Status Wound Number: 3 Primary Diabetic Wound/Ulcer of the Lower Etiology: Extremity Wound Location: Toe Great - Medial Wound Status:  Open Wounding Event: Blister Comorbid Hypertension, Type II Diabetes, Date Acquired: 09/06/2018 History: Neuropathy Weeks Of Treatment: 7 Clustered Wound: No Photos Wound Measurements Length: (cm) 0.3 Width: (cm) 0.2 Depth: (cm) 0.2 Area: (cm) 0.047 Volume: (cm) 0.009 Wound  Description Classification: Grade 2 Wound Margin: Well defined, not attached Exudate Amount: Small Exudate Type: Purulent Exudate Color: yellow, brown, green Wound Bed Granulation Amount: Large (67-100%) Granulation Quality: Pink, Pale Necrotic Amount: None Present (0%) ter Cleansing: No no No Exposed Structure ed: No ubcutaneous Tissue) Exposed: Yes ed: No ed: No d: No : No % Reduction in Area: 94.7% % Reduction in Volume: 99.4% Epithelialization: Medium (34-66%) Tunneling: No Undermining: No Foul Odor Af Slough/Fibri Fascia Expos Fat Layer (S Tendon Expos Muscle Expos Joint Expose Bone Exposed Electronic Signature(s) Signed: 02/09/2019 4:25:19 PM By: Mikeal Hawthorne EMT/HBOT Entered By: Mikeal Hawthorne on 02/09/2019 16:00:10 -------------------------------------------------------------------------------- Vitals Details Patient Name: Date of Service: [name] 02/09/2019 9:30 AM Medical Record [id number] Patient Account Number: [id number] Date of Birth/Sex: Treating RN: 1955-06-07 (63 y.o. Janyth Contes Primary Care Leilanie Rauda: Shawnie Dapper Other Clinician: Referring Amilyah Nack: Treating Samai Corea/Extender:Robson, [name], [name] Weeks in Treatment: 7 Vital Signs Time Taken: 09:59 Temperature (F): 97.7 Height (in): 72 Pulse (bpm): 84 Weight (lbs): 220 Respiratory Rate (breaths/min): 18 Body Mass Index (BMI): 29.8 Blood Pressure (mmHg): 197/92 Capillary Blood Glucose (mg/dl): 150 Reference Range: 80 - 120 mg / dl Notes glucose per pt report Electronic Signature(s) Signed: 02/14/2019 5:30:42 PM By: Levan Hurst RN, BSN Entered By: Levan Hurst on  02/09/2019 10:08:13

## 2019-02-16 ENCOUNTER — Encounter (HOSPITAL_BASED_OUTPATIENT_CLINIC_OR_DEPARTMENT_OTHER): Payer: BC Managed Care – PPO | Admitting: Internal Medicine

## 2019-02-16 ENCOUNTER — Other Ambulatory Visit: Payer: Self-pay

## 2019-02-16 DIAGNOSIS — E11621 Type 2 diabetes mellitus with foot ulcer: Secondary | ICD-10-CM | POA: Diagnosis not present

## 2019-02-17 NOTE — Progress Notes (Signed)
RICE, WALSH ([id number]) Visit Report for 02/16/2019 HPI Details Patient Name: Date of Service: Joel, [name] 02/16/2019 9:30 AM Medical Record [id number] Patient Account Number: [id number] Date of Birth/Sex: Treating RN: Sep 10, 1955 (64 y.o. [name] Primary Care Provider: Nicoletta Ba Other Clinician: Referring Provider: Treating Provider/Extender:Mariacristina Aday, Lottie Rater, [name] Weeks in Treatment: 8 History of Present Illness HPI Description: ADMISSION 12/22/2018 This is a 64 year old type II diabetic patient who was cared for in this clinic from 2012 through 2013 by Dr. Jimmey Ralph for a wound in the same location on the right medial toe. I do not have records from this time however the patient states that he was treated with an advanced treatment product and a prolonged course of total contact casting. He had an MRI of the foot but is unaware whether he had osteomyelitis at that time. He states that sometime toward the beginning of September he noticed a blister in this area with surrounding callus. He saw his primary doctor he was given a course of Augmentin and was put in a surgical shoe. At one point it was felt to be healing nicely. He followed with his primary doctor on 10/19 noted large areas of callus in the wound. Area. He has not been x-rayed or cultured. He has been using topical antibiotic ointment. Past medical history includes type 2 diabetes with polyneuropathy, whitecoat hypertension and hyperlipidemia ABI in our clinic was noncompressible on the right 12/24; culture I did last time showed MRSA. I started him on doxycycline 100 twice daily for 10 days on 12/21. His MRI is scheduled for January 1. I am still going to use silver alginate to this wound and we will see him back after the MRI in 10 days 01/12/2019 patient arrives with considerable amount of callus over the wound. his MRI is not done because of issues with the creatinine although  this was normal in October. He is using a Darco forefoot offloading 1/14; his MRI showed a small plantar ulceration at the medial base of the great toe with adjacent soft tissue enhancement consistent with cellulitis but no evidence of underlying osteomyelitis. This is obviously relieving for a wound that initially probe to bone. This is a recurrence in the same area. We have been using collagen 1/21; the patient is tolerating the antibiotic I gave him last week doxycycline. We have been using collagen. The wound is come down in size still with 3 mm of depth. He is using a forefoot off loader 1/28; the patient has completed antibiotics. We have been using silver collagen. The wound has come in quite a bit. 2/4; the patient arrived in clinic today with the silver collagen somewhat adherent to the wound. After the intake nurse remove the dressing there was some purulence which she cultured. 2/11; we are using silver alginate. Culture I did last time showed staph epidermidis abundant amount. Although staph epidermidis is not a usual clinical pathogen I did put him on trimethoprim sulfamethoxazole 1 p.o. twice daily for 7 days. He is tolerating this well. He comes in today with the wound looking a lot better. He did have a twisting fall and wondered whether he hurt his ankle. He certainly fractured forefoot off loader Electronic Signature(s) Signed: 02/17/2019 1:06:39 PM By: Baltazar Najjar MD Entered By: Baltazar Najjar on 02/16/2019 10:03:05 -------------------------------------------------------------------------------- Physical Exam Details Patient Name: Date of Service: [name] 02/16/2019 9:30 AM Medical Record [id number] Patient Account Number: [id number] Date of Birth/Sex: Treating RN: [birth year] (64 y.o. M) Elesa Hacker, Yvonne Kendall  Primary Care Provider: Nicoletta Ba Other Clinician: Referring Provider: Treating Provider/Extender:Shataya Winkles, Lottie Rater, [name] Weeks in  Treatment: 8 Constitutional Pulse regular and within target range for patient.. Temperature is normal and within the target range for the patient.Marland Kitchen Appears in no distress. Musculoskeletal He has good range of motion about the ankle no focal tenderness.. Notes Wound exam; the area in question a lot better this week after last week's debridement. Does not appear to be any probing depth there is no purulence this almost looks like it is closed over. Electronic Signature(s) Signed: 02/17/2019 1:06:39 PM By: Baltazar Najjar MD Entered By: Baltazar Najjar on 02/16/2019 10:03:58 -------------------------------------------------------------------------------- Physician Orders Details Patient Name: Date of Service: Joel, [name] 02/16/2019 9:30 AM Medical Record [id number] Patient Account Number: [id number] Date of Birth/Sex: Treating RN: September 17, 1955 (65 y.o. [name] Primary Care Provider: Nicoletta Ba Other Clinician: Referring Provider: Treating Provider/Extender:Saladin Petrelli, Lottie Rater, [name] Weeks in Treatment: 8 Verbal / Phone Orders: No Diagnosis Coding ICD-10 Coding Code Description E11.621 Type 2 diabetes mellitus with foot ulcer L97.514 Non-pressure chronic ulcer of other part of right foot with necrosis of bone A49.02 Methicillin resistant Staphylococcus aureus infection, unspecified site Follow-up Appointments Return Appointment in 1 week. - Thursday Dressing Change Frequency Wound #3 Medial Toe Great Change dressing every day. Wound Cleansing Wound #3 Medial Toe Great May shower and wash wound with soap and water. Primary Wound Dressing Wound #3 Medial Toe Great Calcium Alginate with Silver Secondary Dressing Wound #3 Medial Toe Great Kerlix/Rolled Gauze Dry Gauze Edema Control Elevate legs to the level of the heart or above for 30 minutes daily and/or when sitting, a frequency of: - 3-4 times a day throughout the day. Other: - limit walking  and standing while having the wound. Off-Loading Wound #3 Medial Toe Great Wedge shoe to: - front offloading wedge shoe use while walking and standing. Additional Orders / Instructions Follow Nutritious Diet - increase protein and vegetables within diabetic diet. closely monitor blood glucose. Electronic Signature(s) Signed: 02/16/2019 5:57:11 PM By: Shawn Stall Signed: 02/17/2019 1:06:39 PM By: Baltazar Najjar MD Entered By: Shawn Stall on 02/16/2019 09:56:46 -------------------------------------------------------------------------------- Problem List Details Patient Name: Date of Service: [name] 02/16/2019 9:30 AM Medical Record [id number] Patient Account Number: [id number] Date of Birth/Sex: Treating RN: 11/18/1955 (64 y.o. [name] Primary Care Provider: Nicoletta Ba Other Clinician: Referring Provider: Treating Provider/Extender:Shakina Choy, Lottie Rater, [name] Weeks in Treatment: 8 Active Problems ICD-10 Evaluated Encounter Code Description Active Date Today Diagnosis E11.621 Type 2 diabetes mellitus with foot ulcer 12/22/2018 No Yes L97.514 Non-pressure chronic ulcer of other part of right foot 12/22/2018 No Yes with necrosis of bone A49.02 Methicillin resistant Staphylococcus aureus infection, 12/29/2018 No Yes unspecified site Inactive Problems Resolved Problems Electronic Signature(s) Signed: 02/17/2019 1:06:39 PM By: Baltazar Najjar MD Entered By: Baltazar Najjar on 02/16/2019 10:01:39 -------------------------------------------------------------------------------- Progress Note Details Patient Name: Date of Service: [name] 02/16/2019 9:30 AM Medical Record [id number] Patient Account Number: [id number] Date of Birth/Sex: Treating RN: 08/19/1955 (64 y.o. [name] Primary Care Provider: Nicoletta Ba Other Clinician: Referring Provider: Treating Provider/Extender:Tacoya Altizer, Lottie Rater,  [name] Weeks in Treatment: 8 Subjective History of Present Illness (HPI) ADMISSION 12/22/2018 This is a 64 year old type II diabetic patient who was cared for in this clinic from 2012 through 2013 by Dr. Jimmey Ralph for a wound in the same location on the right medial toe. I do not have records from this time however the patient states that he was treated with an advanced treatment  product and a prolonged course of total contact casting. He had an MRI of the foot but is unaware whether he had osteomyelitis at that time. He states that sometime toward the beginning of September he noticed a blister in this area with surrounding callus. He saw his primary doctor he was given a course of Augmentin and was put in a surgical shoe. At one point it was felt to be healing nicely. He followed with his primary doctor on 10/19 noted large areas of callus in the wound. Area. He has not been x-rayed or cultured. He has been using topical antibiotic ointment. Past medical history includes type 2 diabetes with polyneuropathy, whitecoat hypertension and hyperlipidemia ABI in our clinic was noncompressible on the right 12/24; culture I did last time showed MRSA. I started him on doxycycline 100 twice daily for 10 days on 12/21. His MRI is scheduled for January 1. I am still going to use silver alginate to this wound and we will see him back after the MRI in 10 days 01/12/2019 patient arrives with considerable amount of callus over the wound. his MRI is not done because of issues with the creatinine although this was normal in October. He is using a Darco forefoot offloading 1/14; his MRI showed a small plantar ulceration at the medial base of the great toe with adjacent soft tissue enhancement consistent with cellulitis but no evidence of underlying osteomyelitis. This is obviously relieving for a wound that initially probe to bone. This is a recurrence in the same area. We have been using collagen 1/21; the patient  is tolerating the antibiotic I gave him last week doxycycline. We have been using collagen. The wound is come down in size still with 3 mm of depth. He is using a forefoot off loader 1/28; the patient has completed antibiotics. We have been using silver collagen. The wound has come in quite a bit. 2/4; the patient arrived in clinic today with the silver collagen somewhat adherent to the wound. After the intake nurse remove the dressing there was some purulence which she cultured. 2/11; we are using silver alginate. Culture I did last time showed staph epidermidis abundant amount. Although staph epidermidis is not a usual clinical pathogen I did put him on trimethoprim sulfamethoxazole 1 p.o. twice daily for 7 days. He is tolerating this well. He comes in today with the wound looking a lot better. He did have a twisting fall and wondered whether he hurt his ankle. He certainly fractured forefoot off loader Objective Constitutional Pulse regular and within target range for patient.. Temperature is normal and within the target range for the patient.Marland Kitchen Appears in no distress. Vitals Time Taken: 9:29 AM, Height: 72 in, Weight: 220 lbs, BMI: 29.8, Temperature: 98.6 F, Pulse: 92 bpm, Respiratory Rate: 18 breaths/min, Capillary Blood Glucose: 132 mg/dl. Musculoskeletal He has good range of motion about the ankle no focal tenderness.. General Notes: Wound exam; the area in question a lot better this week after last week's debridement. Does not appear to be any probing depth there is no purulence this almost looks like it is closed over. Integumentary (Hair, Skin) Wound #3 status is Open. Original cause of wound was Blister. The wound is located on the Medial Toe Great. The wound measures 0.2cm length x 0.2cm width x 0.2cm depth; 0.031cm^2 area and 0.006cm^3 volume. There is Fat Layer (Subcutaneous Tissue) Exposed exposed. There is no tunneling noted, however, there is undermining starting at 12:00 and  ending at 12:00 with a  maximum distance of 0.1cm. There is a small amount of serosanguineous drainage noted. The wound margin is well defined and not attached to the wound base. There is large (67-100%) red granulation within the wound bed. There is no necrotic tissue within the wound bed. Assessment Active Problems ICD-10 Type 2 diabetes mellitus with foot ulcer Non-pressure chronic ulcer of other part of right foot with necrosis of bone Methicillin resistant Staphylococcus aureus infection, unspecified site Plan Follow-up Appointments: Return Appointment in 1 week. - Thursday Dressing Change Frequency: Wound #3 Medial Toe Great: Change dressing every day. Wound Cleansing: Wound #3 Medial Toe Great: May shower and wash wound with soap and water. Primary Wound Dressing: Wound #3 Medial Toe Great: Calcium Alginate with Silver Secondary Dressing: Wound #3 Medial Toe Great: Kerlix/Rolled Gauze Dry Gauze Edema Control: Elevate legs to the level of the heart or above for 30 minutes daily and/or when sitting, a frequency of: - 3-4 times a day throughout the day. Other: - limit walking and standing while having the wound. Off-Loading: Wound #3 Medial Toe Great: Wedge shoe to: - front offloading wedge shoe use while walking and standing. Additional Orders / Instructions: Follow Nutritious Diet - increase protein and vegetables within diabetic diet. closely monitor blood glucose. 1. I am continue with silver alginate 2. Another forefoot offloading boot will be necessary 3. There is no evidence of a significant injury to the right ankle 4. In view of the clinical improvement this week I see no need for a total contact cast 5. I have asked him to complete the antibiotics I am not sure whether this represented a true pathogen or not Electronic Signature(s) Signed: 02/17/2019 1:06:39 PM By: Linton Ham MD Entered By: Linton Ham on 02/16/2019  10:05:03 -------------------------------------------------------------------------------- SuperBill Details Patient Name: Date of Service: [name] 02/16/2019 Medical Record [id number] Patient Account Number: [id number] Date of Birth/Sex: Treating RN: Dec 03, 1955 (64 y.o. Hessie Diener Primary Care Provider: Shawnie Dapper Other Clinician: Referring Provider: Treating Provider/Extender:Xyler Terpening, [name], [name] Weeks in Treatment: 8 Diagnosis Coding ICD-10 Codes Code Description E11.621 Type 2 diabetes mellitus with foot ulcer L97.514 Non-pressure chronic ulcer of other part of right foot with necrosis of bone A49.02 Methicillin resistant Staphylococcus aureus infection, unspecified site Facility Procedures CPT4 Code: [id number] Description: 99213 - WOUND CARE VISIT-LEV 3 EST PT Modifier: 1 Quantity: Physician Procedures CPT4 Code Description: [id number] 83419 - WC PHYS LEVEL 3 - EST PT ICD-10 Diagnosis Description E11.621 Type 2 diabetes mellitus with foot ulcer L97.514 Non-pressure chronic ulcer of other part of right foot A49.02 Methicillin resistant Staphylococcus  aureus infection, Modifier: with necrosis of unspecified site Quantity: 1 bone Electronic Signature(s) Signed: 02/17/2019 1:06:39 PM By: Linton Ham MD Entered By: Linton Ham on 02/16/2019 10:05:21

## 2019-02-23 ENCOUNTER — Encounter (HOSPITAL_BASED_OUTPATIENT_CLINIC_OR_DEPARTMENT_OTHER): Payer: BC Managed Care – PPO | Admitting: Internal Medicine

## 2019-03-01 ENCOUNTER — Encounter: Payer: Self-pay | Admitting: Family Medicine

## 2019-03-01 ENCOUNTER — Ambulatory Visit (INDEPENDENT_AMBULATORY_CARE_PROVIDER_SITE_OTHER): Payer: BC Managed Care – PPO | Admitting: Family Medicine

## 2019-03-01 ENCOUNTER — Other Ambulatory Visit: Payer: Self-pay

## 2019-03-01 VITALS — BP 144/90 | HR 89 | Temp 98.4°F | Resp 16 | Ht 72.0 in | Wt 244.0 lb

## 2019-03-01 DIAGNOSIS — Z23 Encounter for immunization: Secondary | ICD-10-CM

## 2019-03-01 DIAGNOSIS — Z125 Encounter for screening for malignant neoplasm of prostate: Secondary | ICD-10-CM | POA: Diagnosis not present

## 2019-03-01 DIAGNOSIS — Z1211 Encounter for screening for malignant neoplasm of colon: Secondary | ICD-10-CM | POA: Diagnosis not present

## 2019-03-01 DIAGNOSIS — Z0001 Encounter for general adult medical examination with abnormal findings: Secondary | ICD-10-CM | POA: Diagnosis not present

## 2019-03-01 DIAGNOSIS — E1149 Type 2 diabetes mellitus with other diabetic neurological complication: Secondary | ICD-10-CM

## 2019-03-01 DIAGNOSIS — E7800 Pure hypercholesterolemia, unspecified: Secondary | ICD-10-CM | POA: Diagnosis not present

## 2019-03-01 DIAGNOSIS — I10 Essential (primary) hypertension: Secondary | ICD-10-CM | POA: Diagnosis not present

## 2019-03-01 DIAGNOSIS — R030 Elevated blood-pressure reading, without diagnosis of hypertension: Secondary | ICD-10-CM

## 2019-03-01 DIAGNOSIS — Z0000 Encounter for general adult medical examination without abnormal findings: Secondary | ICD-10-CM

## 2019-03-01 LAB — CBC WITH DIFFERENTIAL/PLATELET
Basophils Absolute: 0 10*3/uL (ref 0.0–0.1)
Basophils Relative: 0.6 % (ref 0.0–3.0)
Eosinophils Absolute: 0.1 10*3/uL (ref 0.0–0.7)
Eosinophils Relative: 2.2 % (ref 0.0–5.0)
HCT: 40.6 % (ref 39.0–52.0)
Hemoglobin: 13.4 g/dL (ref 13.0–17.0)
Lymphocytes Relative: 24.3 % (ref 12.0–46.0)
Lymphs Abs: 1.5 10*3/uL (ref 0.7–4.0)
MCHC: 33 g/dL (ref 30.0–36.0)
MCV: 85.5 fl (ref 78.0–100.0)
Monocytes Absolute: 0.7 10*3/uL (ref 0.1–1.0)
Monocytes Relative: 10.9 % (ref 3.0–12.0)
Neutro Abs: 3.8 10*3/uL (ref 1.4–7.7)
Neutrophils Relative %: 62 % (ref 43.0–77.0)
Platelets: 272 10*3/uL (ref 150.0–400.0)
RBC: 4.75 Mil/uL (ref 4.22–5.81)
RDW: 14.8 % (ref 11.5–15.5)
WBC: 6.2 10*3/uL (ref 4.0–10.5)

## 2019-03-01 LAB — LIPID PANEL
Cholesterol: 211 mg/dL — ABNORMAL HIGH (ref 0–200)
HDL: 53.2 mg/dL (ref >39.00)
LDL Cholesterol: 138 mg/dL — ABNORMAL HIGH (ref 0–99)
NonHDL: 157.3
Total CHOL/HDL Ratio: 4
Triglycerides: 97 mg/dL (ref 0.0–149.0)
VLDL: 19.4 mg/dL (ref 0.0–40.0)

## 2019-03-01 LAB — COMPREHENSIVE METABOLIC PANEL
ALT: 22 U/L (ref 0–53)
AST: 22 U/L (ref 0–37)
Albumin: 4.6 g/dL (ref 3.5–5.2)
Alkaline Phosphatase: 50 U/L (ref 39–117)
BUN: 20 mg/dL (ref 6–23)
CO2: 27 mEq/L (ref 19–32)
Calcium: 10 mg/dL (ref 8.4–10.5)
Chloride: 100 mEq/L (ref 96–112)
Creatinine, Ser: 0.89 mg/dL (ref 0.40–1.50)
GFR: 86.21 mL/min (ref >60.00)
Glucose, Bld: 182 mg/dL — ABNORMAL HIGH (ref 70–99)
Potassium: 4.4 mEq/L (ref 3.5–5.1)
Sodium: 137 mEq/L (ref 135–145)
Total Bilirubin: 0.5 mg/dL (ref 0.2–1.2)
Total Protein: 7.3 g/dL (ref 6.0–8.3)

## 2019-03-01 LAB — TSH: TSH: 1.35 u[IU]/mL (ref 0.35–4.50)

## 2019-03-01 LAB — PSA: PSA: 6.01 ng/mL — ABNORMAL HIGH (ref 0.10–4.00)

## 2019-03-01 MED ORDER — LISINOPRIL 10 MG PO TABS: 10.0000 mg | ORAL_TABLET | Freq: Every day | ORAL | 0 refills | Status: DC

## 2019-03-01 NOTE — Addendum Note (Signed)
Addended by: Emi Holes D on: 03/01/2019 11:00 AM   Modules accepted: Orders

## 2019-03-01 NOTE — Progress Notes (Addendum)
Office Note 03/01/2019  CC:  Chief Complaint  Patient presents with  . Annual Exam    pt is fasting   HPI:  [name]. is a 64 y.o. White male who is here for annual health maintenance exam and f/u DM, HLD. He has long hx of white coat HTN. Last visit 10/24/18, A/P as of that visit: "1) DM 2, hx of poor control and noncompliance.  Control as of 02/2018 wasn't too bad. HbA1c today. Urine microalb/cr and BMET today.  2) HLD: tolerating statin->he admits he usually takes this for a month or so and gets off it b/c of gluteal/hamstring myalgias (which resolve when he gets off statin).  He has been back on atorva for a month or so now.  3) R great toe ulcer: improving. He has large callus where he got skin graft. Hard to tell what pairing this down might reveal, but I told him I don't want to get that aggressive with this type of situation.  I recommended he return to wound care clinic to see what they would do but he persisted in his report that he thinks things are better and this is not needed at this time. He wants to continue topical zinc prep and we'll get him a new post op shoe today for off loading. He would get much better if he could REALLY offload it for a week or so but he says he just can't do this. Signs/symptoms to call or return for were reviewed and pt expressed understanding."  Interim hx:  Hba1c last visit 8.5%.  I recommended he start insulin but he declined. No acute complaints, reports feeling well. Last few months he says glucs better, only says 130s, says no blurring vision and no polyuria or polydipsia. R great toe ulcer on bottom of great toe, has been improving with treatments at wound care center x 3-4 mo, offloading very good lately.  He had MRI that showed no osteo, dx'd with MRSA in the wound, took doxy. No pain in feet and denies signif numbness.    Past Medical History:  Diagnosis Date  . Colon cancer screening    03/2014 iFOB  negative.  . Diabetes mellitus with complication (HCC) 09/12/10   Has mild DPN.  HbA1c 12.6% at the time of dx.  . Elevated blood pressure reading without diagnosis of hypertension 12/26/2010   White coat HTN  . 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  . Hyperlipidemia   . Increased prostate specific antigen (PSA) velocity 2016   ? due to cycling?  Pt chose repeat PSA instead of Biopsy and it showed lower PSA value when he had not been cycling prior to PSA check.  . Obesity, Class I, BMI 30-34.9   . Toe ulcer due to DM [facility]) fall 2012   Right great toe    Past Surgical History:  Procedure Laterality Date  . ROOT CANAL    . TONSILLECTOMY      Family History  Problem Relation Age of Onset  . Diabetes Maternal Grandmother        type 2  . Cancer Maternal Grandfather        all over  . Arthritis Paternal Grandmother   . Heart attack Paternal Grandfather        smoke  . Alcohol abuse Paternal Grandfather        smoker and drinker  . Hodgkin's lymphoma  Cousin   . Diabetes Father     Social History   Socioeconomic History  . Marital status: Married    Spouse name: Not on file  . Number of children: Not on file  . Years of education: Not on file  . Highest education level: Not on file  Occupational History  . Not on file  Tobacco Use  . Smoking status: Never Smoker  . Smokeless tobacco: Former Neurosurgeon    Types: Snuff  . Tobacco comment: Grizzly  Substance and Sexual Activity  . Alcohol use: No  . Drug use: No  . Sexual activity: Yes    Partners: Female  Other Topics Concern  . Not on file  Social History Narrative   Married, has 2 grown sons.     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  Also works in his [employer].   No formal exercise regimen but very active--NOT SEDENTARY.   No T/A/Ds.   Social Determinants of Health   Financial Resource Strain:   . Difficulty of Paying  Living Expenses: Not on file  Food Insecurity:   . Worried About Programme researcher, broadcasting/film/video in the Last Year: Not on file  . Ran Out of Food in the Last Year: Not on file  Transportation Needs:   . Lack of Transportation (Medical): Not on file  . Lack of Transportation (Non-Medical): Not on file  Physical Activity:   . Days of Exercise per Week: Not on file  . Minutes of Exercise per Session: Not on file  Stress:   . Feeling of Stress : Not on file  Social Connections:   . Frequency of Communication with Friends and Family: Not on file  . Frequency of Social Gatherings with Friends and Family: Not on file  . Attends Religious Services: Not on file  . Active Member of Clubs or Organizations: Not on file  . Attends Banker Meetings: Not on file  . Marital Status: Not on file  Intimate Partner Violence:   . Fear of Current or Ex-Partner: Not on file  . Emotionally Abused: Not on file  . Physically Abused: Not on file  . Sexually Abused: Not on file    Outpatient Medications Prior to Visit  Medication Sig Dispense Refill  . B-D ULTRAFINE III SHORT PEN 31G X 8 MM MISC daily. use as directed    . Cyanocobalamin (VITAMIN B 12 PO) Take by mouth.    Marland Kitchen glucose blood (FREESTYLE LITE) test strip Use to check blood sugar twice daily as directed 100 each 5  . Insulin Pen Needle (B-D ULTRAFINE III SHORT PEN) 31G X 8 MM MISC USE AS DIRECTED ONCE DAILY 100 each 11  . liraglutide (VICTOZA) 18 MG/3ML SOPN TAKE 1.8 MG ONCE DAILY AS MAINTENANCE DOSE. 15 mL 3  . metFORMIN (GLUCOPHAGE) 500 MG tablet TAKE 2 TABLETS BY MOUTH TWICE DAILY WITH MEALS 360 tablet 0  . Multiple Vitamins-Minerals (CENTRUM PO) Take 1 tablet by mouth daily.      . pioglitazone (ACTOS) 45 MG tablet Take 1 tablet by mouth once daily 90 tablet 0  . Probiotic Product (PROBIOTIC-10 PO) Take 1 capsule by mouth daily.    Marland Kitchen atorvastatin (LIPITOR) 40 MG tablet Take 1 tablet (40 mg total) by mouth daily. (Patient not taking: Reported  on 03/01/2019) 90 tablet 3  . EPINEPHrine 0.3 mg/0.3 mL IJ SOAJ injection Inject 0.3 mLs (0.3 mg total) into the muscle once. (Patient not taking: Reported on 03/01/2019)  2 Device 1  . Lancet Devices (B-D LANCET DEVICE) MISC Use as directed to check blood sugars 2 times per day 60 each 5   No facility-administered medications prior to visit.    Allergies  Allergen Reactions  . Other     Peanuts - choking     ROS Review of Systems  Constitutional: Negative for appetite change, chills, fatigue and fever.  HENT: Negative for congestion, dental problem, ear pain and sore throat.   Eyes: Negative for discharge, redness and visual disturbance.  Respiratory: Negative for cough, chest tightness, shortness of breath and wheezing.   Cardiovascular: Negative for chest pain, palpitations and leg swelling.  Gastrointestinal: Negative for abdominal pain, blood in stool, diarrhea, nausea and vomiting.  Genitourinary: Negative for difficulty urinating, dysuria, flank pain, frequency, hematuria and urgency.  Musculoskeletal: Negative for arthralgias, back pain, joint swelling, myalgias and neck stiffness.  Skin: Negative for pallor and rash.  Neurological: Negative for dizziness, speech difficulty, weakness and headaches.  Hematological: Negative for adenopathy. Does not bruise/bleed easily.  Psychiatric/Behavioral: Negative for confusion and sleep disturbance. The patient is not nervous/anxious.     PE;  Manual bp at end of office visit was 144/90 Blood pressure (!) 184/108, pulse 89, temperature 98.4 F (36.9 C), temperature source Temporal, resp. rate 16, height 6' (1.829 m), weight 244 lb (110.7 kg), SpO2 99 %. Body mass index is 33.09 kg/m.  Gen: Alert, well appearing.  Patient is oriented to person, place, time, and situation. AFFECT: pleasant, lucid thought and speech. ENT: Ears: EACs clear, normal epithelium.  TMs with good light reflex and landmarks bilaterally.  Eyes: no injection,  icteris, swelling, or exudate.  EOMI, PERRLA. Nose: no drainage or turbinate edema/swelling.  No injection or focal lesion.  Mouth: lips without lesion/swelling.  Oral mucosa pink and moist.  Dentition intact and without obvious caries or gingival swelling.  Oropharynx without erythema, exudate, or swelling.  Neck: supple/nontender.  No LAD, mass, or TM.  Carotid pulses 2+ bilaterally, without bruits. CV: RRR, no m/r/g.   LUNGS: CTA bilat, nonlabored resps, good aeration in all lung fields. ABD: soft, NT, ND, BS normal.  No hepatospenomegaly or mass.  No bruits. EXT: no clubbing or cyanosis.  He has 1+ pitting edema bilat LLs to ankles, with diffuse freckling changes. Musculoskeletal: no joint swelling, erythema, warmth, or tenderness.  ROM of all joints intact. Skin - no sores or suspicious lesions or rashes or color changes Foot exam - no swelling or tender areas of erythema.  He has a superficial dry ulcer w/out erythema on bottom of R great toe. Color and temperature is normal. Sensation impaired to monofilament testing on toes 1-3 on each foot.  Pre-ulcerative callus on tip of 2nd toe L foot. Peripheral pulses are palpable. Toenails are normal.  Rectal: pt declined  Pertinent labs:  Lab Results  Component Value Date   TSH 1.34 11/03/2017   Lab Results  Component Value Date   WBC 7.4 11/03/2017   HGB 13.6 11/03/2017   HCT 40.9 11/03/2017   MCV 86.8 11/03/2017   PLT 279.0 11/03/2017   Lab Results  Component Value Date   CREATININE 0.79 10/24/2018   BUN 19 10/24/2018   NA 137 10/24/2018   K 4.9 10/24/2018   CL 100 10/24/2018   CO2 29 10/24/2018   Lab Results  Component Value Date   ALT 20 10/24/2018   AST 20 10/24/2018   ALKPHOS 72 10/24/2018   BILITOT 0.5 10/24/2018   Lab  Results  Component Value Date   CHOL 131 10/24/2018   Lab Results  Component Value Date   HDL 54.20 10/24/2018   Lab Results  Component Value Date   LDLCALC 66 10/24/2018   Lab Results   Component Value Date   TRIG 52.0 10/24/2018   Lab Results  Component Value Date   CHOLHDL 2 10/24/2018   Lab Results  Component Value Date   PSA 3.73 11/03/2017   PSA 3.0 08/10/2016   PSA 2.72 08/27/2014   Lab Results  Component Value Date   HGBA1C 8.5 (H) 10/24/2018    ASSESSMENT AND PLAN:   1) DM 2, historically poorly controlled. Has not been exercising, mainly due to toe ulcer prob last 4 mo. Diet has been improved last few months. Home gluc monitoring info sparse but seems improved. Feet exam today c/w past exams showing impairment in sensation in toes 1-3 each foot. Improving R great toe ulcer, ongoing mgmt with wound care center (Dr. Leanord Hawking). He has had very mild microalbuminuria.  Starting lisinopril 10 mg today, see #2 below. Hba1c today. Pneumovax booster today. Encouraged him to get covid 19 vaccine. He is due for annual eye exam, states he is going to schedule this.  2) HTN: we've felt like this has been exclusively white coat syndrome in the past but he now reports home monitoring showing 130s-150s systolic. Start lisinopril 10mg  qd and continue bp/hr monitoring and recheck 3-4 wks in office. Therapeutic expectations and side effect profile of medication discussed today.  Patient's questions answered.  3) Health maintenance exam: Reviewed age and gender appropriate health maintenance issues (prudent diet, regular exercise, health risks of tobacco and excessive alcohol, use of seatbelts, fire alarms in home, use of sunscreen).  Also reviewed age and gender appropriate health screening as well as vaccine recommendations. Vaccines: needs pneumovax booster-->given today.  Needs shingrix #2-->given today.  Otherwise UTD. Labs: fasting HP, A1c, PSA. Prostate ca screening: DRE declined by pt today ,getting PSA today. Colon ca screening: has never had colonoscopy.  iFOB neg 2016.  Repeat this now.  An After Visit Summary was printed and given to the  patient.  FOLLOW UP:  Return for 3-4 weeks in person f/u HTN, get BMET.  Signed:  2017, MD           03/01/2019

## 2019-03-01 NOTE — Patient Instructions (Signed)

## 2019-03-02 ENCOUNTER — Encounter (HOSPITAL_BASED_OUTPATIENT_CLINIC_OR_DEPARTMENT_OTHER): Payer: BC Managed Care – PPO | Admitting: Internal Medicine

## 2019-03-02 DIAGNOSIS — E11621 Type 2 diabetes mellitus with foot ulcer: Secondary | ICD-10-CM | POA: Diagnosis not present

## 2019-03-02 LAB — HEMOGLOBIN A1C: Hgb A1c MFr Bld: 8.3 % — ABNORMAL HIGH (ref 4.6–6.5)

## 2019-03-02 NOTE — Progress Notes (Signed)
ROCKEY, GUARINO ([id number]) Visit Report for 03/02/2019 HPI Details Patient Name: Date of Service: [name], [name] 03/02/2019 9:30 AM Medical Record [id number] Patient Account Number: [id number] Date of Birth/Sex: Treating RN: 1955-08-20 (64 y.o. [name] Primary Care Provider: Nicoletta Ba Other Clinician: Referring Provider: Treating Provider/Extender:Dwayne Begay, Lottie Rater, [name] Weeks in Treatment: 10 History of Present Illness HPI Description: ADMISSION 12/22/2018 This is a 64 year old type II diabetic patient who was cared for in this clinic from 2012 through 2013 by Dr. Jimmey Ralph for a wound in the same location on the right medial toe. I do not have records from this time however the patient states that he was treated with an advanced treatment product and a prolonged course of total contact casting. He had an MRI of the foot but is unaware whether he had osteomyelitis at that time. He states that sometime toward the beginning of September he noticed a blister in this area with surrounding callus. He saw his primary doctor he was given a course of Augmentin and was put in a surgical shoe. At one point it was felt to be healing nicely. He followed with his primary doctor on 10/19 noted large areas of callus in the wound. Area. He has not been x-rayed or cultured. He has been using topical antibiotic ointment. Past medical history includes type 2 diabetes with polyneuropathy, whitecoat hypertension and hyperlipidemia ABI in our clinic was noncompressible on the right 12/24; culture I did last time showed MRSA. I started him on doxycycline 100 twice daily for 10 days on 12/21. His MRI is scheduled for January 1. I am still going to use silver alginate to this wound and we will see him back after the MRI in 10 days 01/12/2019 patient arrives with considerable amount of callus over the wound. his MRI is not done because of issues with the creatinine although  this was normal in October. He is using a Darco forefoot offloading 1/14; his MRI showed a small plantar ulceration at the medial base of the great toe with adjacent soft tissue enhancement consistent with cellulitis but no evidence of underlying osteomyelitis. This is obviously relieving for a wound that initially probe to bone. This is a recurrence in the same area. We have been using collagen 1/21; the patient is tolerating the antibiotic I gave him last week doxycycline. We have been using collagen. The wound is come down in size still with 3 mm of depth. He is using a forefoot off loader 1/28; the patient has completed antibiotics. We have been using silver collagen. The wound has come in quite a bit. 2/4; the patient arrived in clinic today with the silver collagen somewhat adherent to the wound. After the intake nurse remove the dressing there was some purulence which she cultured. 2/11; we are using silver alginate. Culture I did last time showed staph epidermidis abundant amount. Although staph epidermidis is not a usual clinical pathogen I did put him on trimethoprim sulfamethoxazole 1 p.o. twice daily for 7 days. He is tolerating this well. He comes in today with the wound looking a lot better. He did have a twisting fall and wondered whether he hurt his ankle. He certainly fractured forefoot off loader 2/25; the patient's wound is closed he has completed his antibiotics. Areas on the right medial toe. We talked about diabetic shoes with custom insert he is going to get his primary doctor to write this. We advised offloading with foam and gauze daily Electronic Signature(s) Signed: 03/02/2019 5:41:09 PM  By: Baltazar Najjar MD Entered By: Baltazar Najjar on 03/02/2019 11:12:03 -------------------------------------------------------------------------------- Physical Exam Details Patient Name: Date of Service: [name], [name] 03/02/2019 9:30 AM Medical Record [id number]  Patient Account Number: [id number] Date of Birth/Sex: Treating RN: [birth year] (64 y.o. [name] Primary Care Provider: Nicoletta Ba Other Clinician: Referring Provider: Treating Provider/Extender:Less Woolsey, Lottie Rater, [name] Weeks in Treatment: 10 Constitutional Patient is hypertensive.. Pulse regular and within target range for patient.Marland Kitchen Respirations regular, non-labored and within target range.. Temperature is normal and within the target range for the patient.Marland Kitchen Appears in no distress. Notes Wound exam; the area in question is closed. This is a recurrent problem for this man although not recently was previously in this clinic 7 or 8 years ago. There is no surrounding infection Electronic Signature(s) Signed: 03/02/2019 5:41:09 PM By: Baltazar Najjar MD Entered By: Baltazar Najjar on 03/02/2019 11:12:53 -------------------------------------------------------------------------------- Physician Orders Details Patient Name: Date of Service: [name] 03/02/2019 9:30 AM Medical Record [id number] Patient Account Number: [id number] Date of Birth/Sex: Treating RN: May 08, 1955 (64 y.o. [name] Primary Care Provider: Nicoletta Ba Other Clinician: Referring Provider: Treating Provider/Extender:Ragen Laver, Lottie Rater, [name] Weeks in Treatment: 10 Verbal / Phone Orders: No Diagnosis Coding ICD-10 Coding Code Description E11.621 Type 2 diabetes mellitus with foot ulcer L97.514 Non-pressure chronic ulcer of other part of right foot with necrosis of bone A49.02 Methicillin resistant Staphylococcus aureus infection, unspecified site Discharge From [facility] Services Discharge from Wound Care Center - call if any future wound care needs. Secondary Dressing Other: - pad the right great toe well for protection for the next 2 weeks. Off-Loading Other: - patient to have primary doctor prescribe for diabetic shoes. Electronic Signature(s) Signed: 03/02/2019  5:41:09 PM By: Baltazar Najjar MD Signed: 03/02/2019 6:07:18 PM By: Shawn Stall Entered By: Shawn Stall on 03/02/2019 10:45:06 -------------------------------------------------------------------------------- Problem List Details Patient Name: Date of Service: [name], [name] 03/02/2019 9:30 AM Medical Record [id number] Patient Account Number: [id number] Date of Birth/Sex: Treating RN: [birth year] (64 y.o. [name] Primary Care Provider: Nicoletta Ba Other Clinician: Referring Provider: Treating Provider/Extender:Jammie Troup, Lottie Rater, [name] Weeks in Treatment: 10 Active Problems ICD-10 Evaluated Encounter Code Description Active Date Today Diagnosis E11.621 Type 2 diabetes mellitus with foot ulcer 12/22/2018 No Yes L97.514 Non-pressure chronic ulcer of other part of right foot 12/22/2018 No Yes with necrosis of bone A49.02 Methicillin resistant Staphylococcus aureus infection, 12/29/2018 No Yes unspecified site Inactive Problems Resolved Problems Electronic Signature(s) Signed: 03/02/2019 5:41:09 PM By: Baltazar Najjar MD Entered By: Baltazar Najjar on 03/02/2019 11:07:33 -------------------------------------------------------------------------------- Progress Note Details Patient Name: Date of Service: [name] 03/02/2019 9:30 AM Medical Record [id number] Patient Account Number: [id number] Date of Birth/Sex: Treating RN: [birth year] (64 y.o. [name] Primary Care Provider: Nicoletta Ba Other Clinician: Referring Provider: Treating Provider/Extender:Danaria Larsen, Lottie Rater, [name] Weeks in Treatment: 10 Subjective History of Present Illness (HPI) ADMISSION 12/22/2018 This is a 64 year old type II diabetic patient who was cared for in this clinic from 2012 through 2013 by Dr. Jimmey Ralph for a wound in the same location on the right medial toe. I do not have records from this time however the patient states that he was  treated with an advanced treatment product and a prolonged course of total contact casting. He had an MRI of the foot but is unaware whether he had osteomyelitis at that time. He states that sometime toward the beginning of September he noticed a blister in this area with surrounding callus. He saw his primary doctor he was  given a course of Augmentin and was put in a surgical shoe. At one point it was felt to be healing nicely. He followed with his primary doctor on 10/19 noted large areas of callus in the wound. Area. He has not been x-rayed or cultured. He has been using topical antibiotic ointment. Past medical history includes type 2 diabetes with polyneuropathy, whitecoat hypertension and hyperlipidemia ABI in our clinic was noncompressible on the right 12/24; culture I did last time showed MRSA. I started him on doxycycline 100 twice daily for 10 days on 12/21. His MRI is scheduled for January 1. I am still going to use silver alginate to this wound and we will see him back after the MRI in 10 days 01/12/2019 patient arrives with considerable amount of callus over the wound. his MRI is not done because of issues with the creatinine although this was normal in October. He is using a Darco forefoot offloading 1/14; his MRI showed a small plantar ulceration at the medial base of the great toe with adjacent soft tissue enhancement consistent with cellulitis but no evidence of underlying osteomyelitis. This is obviously relieving for a wound that initially probe to bone. This is a recurrence in the same area. We have been using collagen 1/21; the patient is tolerating the antibiotic I gave him last week doxycycline. We have been using collagen. The wound is come down in size still with 3 mm of depth. He is using a forefoot off loader 1/28; the patient has completed antibiotics. We have been using silver collagen. The wound has come in quite a bit. 2/4; the patient arrived in clinic today with the  silver collagen somewhat adherent to the wound. After the intake nurse remove the dressing there was some purulence which she cultured. 2/11; we are using silver alginate. Culture I did last time showed staph epidermidis abundant amount. Although staph epidermidis is not a usual clinical pathogen I did put him on trimethoprim sulfamethoxazole 1 p.o. twice daily for 7 days. He is tolerating this well. He comes in today with the wound looking a lot better. He did have a twisting fall and wondered whether he hurt his ankle. He certainly fractured forefoot off loader 2/25; the patient's wound is closed he has completed his antibiotics. Areas on the right medial toe. We talked about diabetic shoes with custom insert he is going to get his primary doctor to write this. We advised offloading with foam and gauze daily Objective Constitutional Patient is hypertensive.. Pulse regular and within target range for patient.Marland Kitchen Respirations regular, non-labored and within target range.. Temperature is normal and within the target range for the patient.Marland Kitchen Appears in no distress. Vitals Time Taken: 9:55 AM, Height: 72 in, Weight: 220 lbs, BMI: 29.8, Temperature: 98.2 F, Pulse: 90 bpm, Respiratory Rate: 18 breaths/min, Blood Pressure: 180/99 mmHg, Capillary Blood Glucose: 135 mg/dl. General Notes: patient stated CBG was 135 General Notes: Wound exam; the area in question is closed. This is a recurrent problem for this man although not recently was previously in this clinic 7 or 8 years ago. There is no surrounding infection Integumentary (Hair, Skin) Wound #3 status is Healed - Epithelialized. Original cause of wound was Blister. The wound is located on the Medial Toe Great. The wound measures 0cm length x 0cm width x 0cm depth; 0cm^2 area and 0cm^3 volume. There is no tunneling or undermining noted. There is a none present amount of drainage noted. The wound margin is well defined and not attached to  the wound  base. There is no granulation within the wound bed. There is no necrotic tissue within the wound bed. Assessment Active Problems ICD-10 Type 2 diabetes mellitus with foot ulcer Non-pressure chronic ulcer of other part of right foot with necrosis of bone Methicillin resistant Staphylococcus aureus infection, unspecified site Plan [facility]charge From [facility] Services: Discharge from Wound Care Center - call if any future wound care needs. Secondary Dressing: Other: - pad the right great toe well for protection for the next 2 weeks. Off-Loading: Other: - patient to have primary doctor prescribe for diabetic shoes. 1. The patient can be discharged from the wound care center 2. We advised him to keep this area padded using foam and gauze 3. We will get a primary physician prescription for diabetic shoes with custom inserts Electronic Signature(s) Signed: 03/02/2019 5:41:09 PM By: Baltazar Najjar MD Entered By: Baltazar Najjar on 03/02/2019 11:13:30 -------------------------------------------------------------------------------- SuperBill Details Patient Name: Date of Service: [name] 03/02/2019 Medical Record [id number] Patient Account Number: [id number] Date of Birth/Sex: Treating RN: Nov 15, 1955 (64 y.o. [name] Primary Care Provider: Nicoletta Ba Other Clinician: Referring Provider: Treating Provider/Extender:Demaryius Imran, Lottie Rater, [name] Weeks in Treatment: 10 Diagnosis Coding ICD-10 Codes Code Description E11.621 Type 2 diabetes mellitus with foot ulcer L97.514 Non-pressure chronic ulcer of other part of right foot with necrosis of bone A49.02 Methicillin resistant Staphylococcus aureus infection, unspecified site Facility Procedures CPT4 Code: [id number] Description: 99213 - WOUND CARE VISIT-LEV 3 EST PT Modifier: Quantity: 1 Physician Procedures CPT4 Code Description: [id number] - WC PHYS LEVEL 2 - EST PT ICD-10 Diagnosis Description E11.621 Type  2 diabetes mellitus with foot ulcer L97.514 Non-pressure chronic ulcer of other part of right foot wit Modifier: h necrosis of Quantity: 1 bone Electronic Signature(s) Signed: 03/02/2019 5:41:09 PM By: Baltazar Najjar MD Entered By: Baltazar Najjar on 03/02/2019 11:14:00

## 2019-03-03 NOTE — Progress Notes (Signed)
[name], Joel ([id number]) Visit Report for 03/02/2019 Arrival Information Details Patient Name: Date of Service: Joel, [name] 03/02/2019 9:30 AM Medical Record [id number] Patient Account Number: [id number] Date of Birth/Sex: Treating RN: 1955-11-29 (64 y.o. Katherina Right Primary Care Kallista Pae: Nicoletta Ba Other Clinician: Referring Kelsea Mousel: Treating Eann Cleland/Extender:Robson, Lottie Rater, [name] Weeks in Treatment: 10 Visit Information History Since Last Visit Added or deleted any medications: No Patient Arrived: Ambulatory Any new allergies or adverse reactions: No Arrival Time: 09:58 Had a fall or experienced change in No Accompanied By: self activities of daily living that may affect Transfer Assistance: None risk of falls: Patient Identification Verified: Yes Signs or symptoms of abuse/neglect No Secondary Verification Process Yes since last visito Completed: Hospitalized since last visit: No Patient Requires Transmission- No Implantable device outside of the clinic No Based Precautions: excluding Patient Has Alerts: Yes cellular tissue based products placed in Patient Alerts: Right ABI: non the center compress since last visit: Has Dressing in Place as Prescribed: Yes Has Footwear/Offloading in Place as Yes Prescribed: Left: Other:front offloader Pain Present Now: No Electronic Signature(s) Signed: 03/03/2019 5:17:01 PM By: Cherylin Mylar Entered By: Cherylin Mylar on 03/02/2019 09:58:31 -------------------------------------------------------------------------------- Clinic Level of Care Assessment Details Patient Name: Date of Service: [name], [name] 03/02/2019 9:30 AM Medical Record [id number] Patient Account Number: [id number] Date of Birth/Sex: Treating RN: 1955/05/17 (64 y.o. [name] Primary Care Anothy Bufano: Nicoletta Ba Other Clinician: Referring Rashelle Ireland: Treating  Keiyon Plack/Extender:Robson, Lottie Rater, [name] Weeks in Treatment: 10 Clinic Level of Care Assessment Items TOOL 4 Quantity Score X - Use when only an EandM is performed on FOLLOW-UP visit 1 0 ASSESSMENTS - Nursing Assessment / Reassessment X - Reassessment of Co-morbidities (includes updates in patient status) 1 10 X - Reassessment of Adherence to Treatment Plan 1 5 ASSESSMENTS - Wound and Skin Assessment / Reassessment X - Simple Wound Assessment / Reassessment - one wound 1 5 []  - Complex Wound Assessment / Reassessment - multiple wounds 0 []  - Dermatologic / Skin Assessment (not related to wound area) 0 ASSESSMENTS - Focused Assessment X - Circumferential Edema Measurements - multi extremities 1 5 X - Nutritional Assessment / Counseling / Intervention 1 10 []  - Lower Extremity Assessment (monofilament, tuning fork, pulses) 0 []  - Peripheral Arterial Disease Assessment (using hand held doppler) 0 ASSESSMENTS - Ostomy and/or Continence Assessment and Care []  - Incontinence Assessment and Management 0 []  - Ostomy Care Assessment and Management (repouching, etc.) 0 PROCESS - Coordination of Care X - Simple Patient / Family Education for ongoing care 1 15 []  - Complex (extensive) Patient / Family Education for ongoing care 0 X - Staff obtains , Records, Test Results / Process Orders 1 10 []  - Staff telephones HHA, Nursing Homes / Clarify orders / etc 0 []  - Routine Transfer to another Facility (non-emergent condition) 0 []  - Routine Hospital Admission (non-emergent condition) 0 []  - New Admissions / / Ordering NPWT, Apligraf, etc. 0 []  - Emergency Hospital Admission (emergent condition) 0 X - Simple Discharge Coordination 1 10 []  - Complex (extensive) Discharge Coordination 0 PROCESS - Special Needs []  - Pediatric / Minor Patient Management 0 []  - Isolation Patient Management 0 []  - Hearing / Language / Visual special needs 0 []  - Assessment of  Community assistance (transportation, D/C planning, etc.) 0 []  - Additional assistance / Altered mentation 0 []  - Support Surface(s) Assessment (bed, cushion, seat, etc.) 0 INTERVENTIONS - Wound Cleansing / Measurement X - Simple Wound Cleansing -  one wound 1 5 []  - Complex Wound Cleansing - multiple wounds 0 X - Wound Imaging (photographs - any number of wounds) 1 5 []  - Wound Tracing (instead of photographs) 0 X - Simple Wound Measurement - one wound 1 5 []  - Complex Wound Measurement - multiple wounds 0 INTERVENTIONS - Wound Dressings []  - Small Wound Dressing one or multiple wounds 0 []  - Medium Wound Dressing one or multiple wounds 0 []  - Large Wound Dressing one or multiple wounds 0 []  - Application of Medications - topical 0 []  - Application of Medications - injection 0 INTERVENTIONS - Miscellaneous []  - External ear exam 0 []  - Specimen Collection (cultures, biopsies, blood, body fluids, etc.) 0 []  - Specimen(s) / Culture(s) sent or taken to Lab for analysis 0 []  - Patient Transfer (multiple staff / Civil Service fast streamer / Similar devices) 0 []  - Simple Staple / Suture removal (25 or less) 0 []  - Complex Staple / Suture removal (26 or more) 0 []  - Hypo / Hyperglycemic Management (close monitor of Blood Glucose) 0 []  - Ankle / Brachial Index (ABI) - do not check if billed separately 0 X - Vital Signs 1 5 Has the patient been seen at the hospital within the last three years: Yes Total Score: 90 Level Of Care: New/Established - Level 3 Electronic Signature(s) Signed: 03/02/2019 6:07:18 PM By: Deon Pilling Entered By: Deon Pilling on 03/02/2019 10:52:58 -------------------------------------------------------------------------------- Encounter Discharge Information Details Patient Name: Date of Service: [name] 03/02/2019 9:30 AM Medical Record [id number] Patient Account Number: [id number] Date of Birth/Sex: Treating RN: 01-Sep-1955 (64 y.o. Hessie Diener Primary  Care Kimon Loewen: Shawnie Dapper Other Clinician: Referring Thunder [place]: Treating Ousman Dise/Extender:Robson, [name], [name] Weeks in Treatment: 10 Encounter Discharge Information Items Discharge Condition: Stable Ambulatory Status: Ambulatory Discharge Destination: Home Transportation: Private Auto Accompanied By: self Schedule Follow-up Appointment: No Clinical Summary of Care: Notes foam protection applied to right great toe healed area. Electronic Signature(s) Signed: 03/02/2019 6:07:18 PM By: Deon Pilling Entered By: Deon Pilling on 03/02/2019 10:54:29 -------------------------------------------------------------------------------- Lower Extremity Assessment Details Patient Name: Date of Service: [name], [name] 03/02/2019 9:30 AM Medical Record [id number] Patient Account Number: [id number] Date of Birth/Sex: Treating RN: 1955/07/17 (64 y.o. Marvis Repress Primary Care Tangala Wiegert: Shawnie Dapper Other Clinician: Referring Ruhaan Nordahl: Treating Kirby Cortese/Extender:Robson, [name], [name] Weeks in Treatment: 10 Edema Assessment Assessed: [Left: No] [Right: No] Edema: [Left: N] [Right: o] Calf Left: Right: Point of Measurement: 35 cm From Medial Instep cm 43 cm Ankle Left: Right: Point of Measurement: 7 cm From Medial Instep cm 24 cm Vascular Assessment Pulses: Dorsalis Pedis Palpable: [Right:Yes] Electronic Signature(s) Signed: 03/03/2019 5:17:01 PM By: Kela Millin Entered By: Kela Millin on 03/02/2019 10:00:20 -------------------------------------------------------------------------------- Multi Wound Chart Details Patient Name: Date of Service: [name] 03/02/2019 9:30 AM Medical Record [id number] Patient Account Number: [id number] Date of Birth/Sex: Treating RN: [birth year] (64 y.o. 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] Comorbid History: [3:Hypertension, Type II Diabetes, Neuropathy] [N/A:N/A] Date Acquired: [3:09/06/2018] [N/A:N/A] Weeks of Treatment: [3:10] [N/A:N/A] Wound Status: [3:Healed - Epithelialized] [N/A:N/A] Measurements L x W x D 0x0x0 [N/A:N/A] (cm) Area (cm) : [3:0] [  N/A:N/A] Volume (cm) : [3:0] [N/A:N/A] % Reduction in Area: [3:100.00%] [N/A:N/A] % Reduction in Volume: 100.00% [N/A:N/A] Classification: [3:Grade 2] [N/A:N/A] Exudate Amount: [3:None Present] [N/A:N/A] Wound Margin: [3:Well defined, not attached N/A] Granulation Amount: [3:None Present (0%)] [N/A:N/A] Necrotic Amount: [3:None Present (0%)] [N/A:N/A] Exposed Structures: [3:Fascia: No Fat Layer (Subcutaneous Tissue) Exposed: No Tendon: No Muscle: No Joint: No Bone: No Medium (34-66%)] [N/A:N/A N/A] Treatment Notes Electronic Signature(s) Signed: 03/02/2019 5:41:09 PM By: Baltazar Najjar MD Signed: 03/02/2019 6:07:18 PM By: Shawn Stall Entered By: Baltazar Najjar on 03/02/2019 11:10:50 -------------------------------------------------------------------------------- Multi-Disciplinary Care Plan Details Patient Name: Date of Service: [name]. 03/02/2019 9:30 AM Medical Record [id number] Patient Account Number: [id number] Date of Birth/Sex: Treating RN: [birth year] (64 y.o. [name] Primary Care Eleina Jergens: Nicoletta Ba Other Clinician: Referring Amando Chaput: Treating Trentyn Boisclair/Extender:Robson, Lottie Rater, [name] Weeks in Treatment: 10 Active  Inactive Electronic Signature(s) Signed: 03/02/2019 6:07:18 PM By: Shawn Stall Entered By: Shawn Stall on 03/02/2019 10:31:30 -------------------------------------------------------------------------------- Pain Assessment Details Patient Name: Date of Service: [name], [name] 03/02/2019 9:30 AM Medical Record [id number] Patient Account Number: [id number] Date of Birth/Sex: Treating RN: March 21, 1955 (64 y.o. Katherina Right Primary Care Camelia Stelzner: Nicoletta Ba Other Clinician: Referring Marysol Wellnitz: Treating Anuja Manka/Extender:Robson, Lottie Rater, [name] Weeks in Treatment: 10 Active Problems Location of Pain Severity and Description of Pain Patient Has Paino No Site Locations Pain Management and Medication Current Pain Management: Electronic Signature(s) Signed: 03/03/2019 5:17:01 PM By: Cherylin Mylar Entered By: Cherylin Mylar on 03/02/2019 09:59:44 -------------------------------------------------------------------------------- Patient/Caregiver Education Details Howie Ill 2/25/2021andnbsp9:30 Patient Name: Date of Service: J. AM Medical Record Patient Account Number: [id number] Number: Treating RN: Shawn Stall Date of Birth/Gender: [birth year] (63 y.o. M) Other Clinician: Primary Care Physician: Tedd Sias Referring Physician: Physician/Extender: Santo Held in Treatment: 10 Education Assessment Education Provided To: Patient Education Topics Provided Wound/Skin Impairment: Handouts: Skin Care Do's and Dont's Methods: Explain/Verbal Responses: Reinforcements needed Electronic Signature(s) Signed: 03/02/2019 6:07:18 PM By: Shawn Stall Entered By: Shawn Stall on 03/02/2019 10:31:52 -------------------------------------------------------------------------------- Wound Assessment Details Patient Name: Date of Service: [name], [name] 03/02/2019 9:30 AM Medical Record  [id number] Patient Account Number: [id number] Date of Birth/Sex: Treating RN: August 22, 1955 (64 y.o. [name] Primary Care Devaris Quirk: Nicoletta Ba Other Clinician: Referring Aveya Beal: Treating Kamariyah Timberlake/Extender:Robson, Lottie Rater, [name] Weeks in Treatment: 10 Wound Status Wound Number: 3 Primary Diabetic Wound/Ulcer of the Lower Etiology: Extremity Wound Location: Toe Great - Medial Wound Status: Healed - Epithelialized Wounding Event: Blister Comorbid Hypertension, Type II Diabetes, Date Acquired: 09/06/2018 History: Neuropathy Weeks Of Treatment: 10 Clustered Wound: No Photos Wound Measurements Length: (cm) 0 % Reduction Width: (cm) 0 % Reduction Depth: (cm) 0 Epitheliali Area: (cm) 0 Tunneling: Volume: (cm) 0 Underminin Wound Description Classification: Grade 2 Foul Odor A Wound Margin: Well defined, not attached Slough/Fibr Exudate Amount: None Present Wound Bed Granulation Amount: None Present (0%) Necrotic Amount: None Present (0%) Fascia Expo Fat Layer ( Tendon Expo Muscle Expo Joint Expos Bone Expose fter Cleansing: No ino No Exposed Structure sed: No Subcutaneous Tissue) Exposed: No sed: No sed: No ed: No d: No in Area: 100% in Volume: 100% zation: Medium (34-66%) No g: No Electronic Signature(s) Signed: 03/02/2019 5:25:06 PM By: Benjaman Kindler EMT/HBOT Signed: 03/02/2019 6:07:18 PM By: Shawn Stall Entered By: Benjaman Kindler on 03/02/2019 14:21:15 -------------------------------------------------------------------------------- Vitals Details Patient Name: Date of Service: [name] 03/02/2019 9:30 AM Medical Record [id number] Patient Account Number: [id number] Date of Birth/Sex: Treating RN: [birth year] (64 y.o. Katherina Right Primary  Care Nellene Courtois: Nicoletta Ba Other Clinician: Referring Evadean Sproule: Treating Trevonte Ashkar/Extender:Robson, Lottie Rater, [name] Weeks in Treatment: 10 Vital Signs Time  Taken: 09:55 Temperature (F): 98.2 Height (in): 72 Pulse (bpm): 90 Weight (lbs): 220 Respiratory Rate (breaths/min): 18 Body Mass Index (BMI): 29.8 Blood Pressure (mmHg): 180/99 Capillary Blood Glucose (mg/dl): 144 Reference Range: 80 - 120 mg / dl Notes patient stated CBG was 135 Electronic Signature(s) Signed: 03/03/2019 5:17:01 PM By: Cherylin Mylar Entered By: Cherylin Mylar on 03/02/2019 09:59:36

## 2019-03-07 ENCOUNTER — Encounter: Payer: Self-pay | Admitting: Family Medicine

## 2019-03-14 ENCOUNTER — Other Ambulatory Visit: Payer: Self-pay | Admitting: Family Medicine

## 2019-03-14 ENCOUNTER — Telehealth: Payer: Self-pay

## 2019-03-14 NOTE — Telephone Encounter (Signed)
He has to go to the location and request the inserts, then they will fax paperwork to me to fill out so insurance will pay for this.  In other words, it is not as simple as me just sending a rx in.-thx

## 2019-03-14 NOTE — Telephone Encounter (Signed)
Patient was advised of PCP recommendations. 

## 2019-03-14 NOTE — Telephone Encounter (Signed)
Pt's last visit was 03/01/19 for routine f/u and following up on 03/22/19. Please advise, thanks.

## 2019-03-14 NOTE — Telephone Encounter (Signed)
Patient requesting diabetic inserts for shoes. Please call patient with any questions at [phone].  He says he thought the place to get them at is on [address] in [place]. Please clarify location.

## 2019-03-22 ENCOUNTER — Ambulatory Visit: Payer: BC Managed Care – PPO | Admitting: Family Medicine

## 2019-03-22 DIAGNOSIS — Z0289 Encounter for other administrative examinations: Secondary | ICD-10-CM

## 2019-03-22 NOTE — Progress Notes (Deleted)
OFFICE VISIT  03/22/2019   CC: No chief complaint on file.  HPI:    Patient is a 64 y.o. Caucasian male who presents for 3 week f/u DM 2 and HTN. A/P as of last visit: "1) DM 2, historically poorly controlled. Has not been exercising, mainly due to toe ulcer prob last 4 mo. Diet has been improved last few months. Home gluc monitoring info sparse but seems improved. Feet exam today c/w past exams showing impairment in sensation in toes 1-3 each foot. Improving R great toe ulcer, ongoing mgmt with wound care center (Dr. Dellia Nims). He has had very mild microalbuminuria.  Starting lisinopril 10 mg today, see #2 below. Hba1c today. Pneumovax booster today. Encouraged him to get covid 19 vaccine. He is due for annual eye exam, states he is going to schedule this.  2) HTN: we've felt like this has been exclusively white coat syndrome in the past but he now reports home monitoring showing [id number] systolic. Start lisinopril 10mg  qd and continue bp/hr monitoring and recheck 3-4 wks in office. Therapeutic expectations and side effect profile of medication discussed today.  Patient's questions answered.  3) Health maintenance exam: Reviewed age and gender appropriate health maintenance issues (prudent diet, regular exercise, health risks of tobacco and excessive alcohol, use of seatbelts, fire alarms in home, use of sunscreen).  Also reviewed age and gender appropriate health screening as well as vaccine recommendations. Vaccines: needs pneumovax booster-->given today.  Needs shingrix #2-->given today.  Otherwise UTD. Labs: fasting HP, A1c, PSA. Prostate ca screening: DRE declined by pt today ,getting PSA today. Colon ca screening: has never had colonoscopy.  iFOB neg 2016.  Repeat this now.   Return for 3-4 weeks in person f/u HTN, get BMET."   Interim hx: LABS showed PSA slightly up again (he still does road cycling), LDL was up, A1c was up. Decided to hold off on inc statin at this  time. Plan to repeat PSA after he stops cycling for 6 mo. Need to discuss addition of daily long acting insulin today, an idea he has been resistant to in the past---he is currently on victoza 1.8 qd, metformin 1000 mg bid, and actos 45 mg qd.  Started him on lisinopril last visit for HTN-->***   Past Medical History:  Diagnosis Date  . Colon cancer screening    03/2014 iFOB negative.  . Diabetes mellitus with complication ([place]) 01/06/70   Has mild DPN.  HbA1c 12.6% at the time of dx.  . Elevated blood pressure reading without diagnosis of hypertension 12/26/2010   White coat HTN  . 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  . Hyperlipidemia   . Increased prostate specific antigen (PSA) velocity 2016   ? due to cycling?  Pt chose repeat PSA instead of Biopsy and it showed lower PSA value when he had not been cycling prior to PSA check.  . Obesity, Class I, BMI 30-34.9   . Toe ulcer due to DM [facility]) fall 2012   Right great toe    Past Surgical History:  Procedure Laterality Date  . ROOT CANAL    . TONSILLECTOMY      Outpatient Medications Prior to Visit  Medication Sig Dispense Refill  . atorvastatin (LIPITOR) 40 MG tablet Take 1 tablet (40 mg total) by mouth daily. (Patient not taking: Reported on 03/01/2019) 90 tablet 3  . B-D ULTRAFINE III SHORT PEN 31G X 8 MM  MISC daily. use as directed    . Cyanocobalamin (VITAMIN B 12 PO) Take by mouth.    . EPINEPHrine 0.3 mg/0.3 mL IJ SOAJ injection Inject 0.3 mLs (0.3 mg total) into the muscle once. (Patient not taking: Reported on 03/01/2019) 2 Device 1  . glucose blood (FREESTYLE LITE) test strip Use to check blood sugar twice daily as directed 100 each 5  . Insulin Pen Needle (B-D ULTRAFINE III SHORT PEN) 31G X 8 MM MISC USE AS DIRECTED ONCE DAILY 100 each 11  . Lancet Devices (B-D LANCET DEVICE) MISC Use as directed to check blood sugars 2 times per day 60 each 5  .  liraglutide (VICTOZA) 18 MG/3ML SOPN TAKE 1.8 MG ONCE DAILY AS MAINTENANCE DOSE. 15 mL 3  . lisinopril (ZESTRIL) 10 MG tablet Take 1 tablet (10 mg total) by mouth daily. 30 tablet 0  . metFORMIN (GLUCOPHAGE) 500 MG tablet TAKE 2 TABLETS BY MOUTH TWICE DAILY WITH MEALS 360 tablet 0  . Multiple Vitamins-Minerals (CENTRUM PO) Take 1 tablet by mouth daily.      . pioglitazone (ACTOS) 45 MG tablet Take 1 tablet by mouth once daily 90 tablet 0  . Probiotic Product (PROBIOTIC-10 PO) Take 1 capsule by mouth daily.     No facility-administered medications prior to visit.    Allergies  Allergen Reactions  . Other     Peanuts - choking     ROS As per HPI  PE: There were no vitals taken for this visit. ***  LABS:  Lab Results  Component Value Date   TSH 1.35 03/01/2019   Lab Results  Component Value Date   WBC 6.2 03/01/2019   HGB 13.4 03/01/2019   HCT 40.6 03/01/2019   MCV 85.5 03/01/2019   PLT 272.0 03/01/2019   Lab Results  Component Value Date   CREATININE 0.89 03/01/2019   BUN 20 03/01/2019   NA 137 03/01/2019   K 4.4 03/01/2019   CL 100 03/01/2019   CO2 27 03/01/2019   Lab Results  Component Value Date   ALT 22 03/01/2019   AST 22 03/01/2019   ALKPHOS 50 03/01/2019   BILITOT 0.5 03/01/2019   Lab Results  Component Value Date   CHOL 211 (H) 03/01/2019   Lab Results  Component Value Date   HDL 53.20 03/01/2019   Lab Results  Component Value Date   LDLCALC 138 (H) 03/01/2019   Lab Results  Component Value Date   TRIG 97.0 03/01/2019   Lab Results  Component Value Date   CHOLHDL 4 03/01/2019   Lab Results  Component Value Date   PSA 6.01 (H) 03/01/2019   PSA 3.73 11/03/2017   PSA 3.0 08/10/2016   Lab Results  Component Value Date   HGBA1C 8.3 (H) 03/01/2019   IMPRESSION AND PLAN:  No problem-specific Assessment & Plan notes found for this encounter.   An After Visit Summary was printed and given to the patient.  FOLLOW UP: No follow-ups  on file.  Signed:  Santiago Bumpers, MD           03/22/2019

## 2019-04-03 ENCOUNTER — Other Ambulatory Visit: Payer: Self-pay | Admitting: Family Medicine

## 2019-04-07 ENCOUNTER — Other Ambulatory Visit: Payer: Self-pay | Admitting: Family Medicine

## 2019-04-10 ENCOUNTER — Ambulatory Visit: Payer: BC Managed Care – PPO | Admitting: Family Medicine

## 2019-04-10 ENCOUNTER — Other Ambulatory Visit: Payer: Self-pay

## 2019-04-10 MED ORDER — LISINOPRIL 10 MG PO TABS: 10.0000 mg | ORAL_TABLET | Freq: Every day | ORAL | 0 refills | Status: DC

## 2019-04-11 ENCOUNTER — Encounter: Payer: Self-pay | Admitting: Family Medicine

## 2019-04-11 ENCOUNTER — Ambulatory Visit: Payer: BC Managed Care – PPO | Admitting: Family Medicine

## 2019-04-11 ENCOUNTER — Other Ambulatory Visit: Payer: Self-pay

## 2019-04-11 VITALS — BP 158/90 | HR 90 | Temp 98.4°F | Resp 16 | Ht 72.0 in | Wt 246.4 lb

## 2019-04-11 DIAGNOSIS — I10 Essential (primary) hypertension: Secondary | ICD-10-CM | POA: Diagnosis not present

## 2019-04-11 MED ORDER — LISINOPRIL 20 MG PO TABS: 20.0000 mg | ORAL_TABLET | Freq: Every day | ORAL | 1 refills | Status: DC

## 2019-04-11 NOTE — Progress Notes (Signed)
OFFICE VISIT  04/11/2019   CC:  Chief Complaint  Patient presents with  . Follow-up    hypertension, pt is not fasting    HPI:    Patient is a 64 y.o. Caucasian male who presents for f/u HTN. A/P as of last visit: ") DM 2, historically poorly controlled. Has not been exercising, mainly due to toe ulcer prob last 4 mo. Diet has been improved last few months. Home gluc monitoring info sparse but seems improved. Feet exam today c/w past exams showing impairment in sensation in toes 1-3 each foot. Improving R great toe ulcer, ongoing mgmt with wound care center (Dr. Leanord Hawking). He has had very mild microalbuminuria.  Starting lisinopril 10 mg today, see #2 below. Hba1c today. Pneumovax booster today. Encouraged him to get covid 19 vaccine. He is due for annual eye exam, states he is going to schedule this.  2) HTN: we've felt like this has been exclusively white coat syndrome in the past but he now reports home monitoring showing 130s-150s systolic. Start lisinopril 10mg  qd and continue bp/hr monitoring and recheck 3-4 wks in office. Therapeutic expectations and side effect profile of medication discussed today.  Patient's questions answered.  3) Health maintenance exam: Reviewed age and gender appropriate health maintenance issues (prudent diet, regular exercise, health risks of tobacco and excessive alcohol, use of seatbelts, fire alarms in home, use of sunscreen).  Also reviewed age and gender appropriate health screening as well as vaccine recommendations. Vaccines: needs pneumovax booster-->given today.  Needs shingrix #2-->given today.  Otherwise UTD. Labs: fasting HP, A1c, PSA. Prostate ca screening: DRE declined by pt today ,getting PSA today. Colon ca screening: has never had colonoscopy.  iFOB neg 2016.  Repeat this now."  Interim hx: Again, I recommended starting insulin after most recent A1c but he declined. Toe is healed, has been released by Dr. 2017.  Started him  on lisinopril last visit for HTN (but he also has mild microalbuminuria). Home bp's consistently 150-160s/90s.  HR typically 70s.  PSA was up a little but he cycles, has hx of increased PSA velocity that he saw urology for and they felt it was related to his cycling, PSA f/u when not cycling had dropped.   Past Medical History:  Diagnosis Date  . Colon cancer screening    03/2014 iFOB negative.  . Diabetes mellitus with complication (HCC) 09/12/10   Has mild DPN.  HbA1c 12.6% at the time of dx.  . Elevated blood pressure reading without diagnosis of hypertension 12/26/2010   White coat HTN  . 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  . Hyperlipidemia   . Increased prostate specific antigen (PSA) velocity 2016   ? due to cycling?  Pt chose repeat PSA instead of Biopsy and it showed lower PSA value when he had not been cycling prior to PSA check.  . Obesity, Class I, BMI 30-34.9   . Toe ulcer due to DM [facility]) fall 2012   Right great toe    Past Surgical History:  Procedure Laterality Date  . ROOT CANAL    . TONSILLECTOMY      Outpatient Medications Prior to Visit  Medication Sig Dispense Refill  . B-D ULTRAFINE III SHORT PEN 31G X 8 MM MISC daily. use as directed    . Cyanocobalamin (VITAMIN B 12 PO) Take by mouth.    2013 glucose blood (FREESTYLE LITE) test strip Use to check  blood sugar twice daily as directed 100 each 5  . Insulin Pen Needle (B-D ULTRAFINE III SHORT PEN) 31G X 8 MM MISC USE AS DIRECTED ONCE DAILY 100 each 11  . Lancet Devices (B-D LANCET DEVICE) MISC Use as directed to check blood sugars 2 times per day 60 each 5  . liraglutide (VICTOZA) 18 MG/3ML SOPN INJECT 1.8 MG SUBCUTANEOUSLY ONCE DAILY 15 mL 0  . metFORMIN (GLUCOPHAGE) 500 MG tablet TAKE 2 TABLETS BY MOUTH TWICE DAILY WITH MEALS 360 tablet 0  . Multiple Vitamins-Minerals (CENTRUM PO) Take 1 tablet by mouth daily.      . pioglitazone (ACTOS) 45 MG  tablet Take 1 tablet by mouth once daily 90 tablet 0  . Probiotic Product (PROBIOTIC-10 PO) Take 1 capsule by mouth daily.    Marland Kitchen lisinopril (ZESTRIL) 10 MG tablet Take 1 tablet (10 mg total) by mouth daily. 30 tablet 0  . atorvastatin (LIPITOR) 40 MG tablet Take 1 tablet (40 mg total) by mouth daily. (Patient not taking: Reported on 03/01/2019) 90 tablet 3  . EPINEPHrine 0.3 mg/0.3 mL IJ SOAJ injection Inject 0.3 mLs (0.3 mg total) into the muscle once. (Patient not taking: Reported on 03/01/2019) 2 Device 1   No facility-administered medications prior to visit.    Allergies  Allergen Reactions  . Other     Peanuts - choking     ROS As per HPI  PE: Blood pressure (!) 158/90, pulse 90, temperature 98.4 F (36.9 C), temperature source Temporal, resp. rate 16, height 6' (1.829 m), weight 246 lb 6.4 oz (111.8 kg), SpO2 97 %. Body mass index is 33.42 kg/m.  Gen: Alert, well appearing.  Patient is oriented to person, place, time, and situation. AFFECT: pleasant, lucid thought and speech. CV: RRR, no m/r/g.   LUNGS: CTA bilat, nonlabored resps, good aeration in all lung fields. EXT: no clubbing or cyanosis.  no edema.    LABS:  Lab Results  Component Value Date   TSH 1.35 03/01/2019   Lab Results  Component Value Date   WBC 6.2 03/01/2019   HGB 13.4 03/01/2019   HCT 40.6 03/01/2019   MCV 85.5 03/01/2019   PLT 272.0 03/01/2019   Lab Results  Component Value Date   CREATININE 0.89 03/01/2019   BUN 20 03/01/2019   NA 137 03/01/2019   K 4.4 03/01/2019   CL 100 03/01/2019   CO2 27 03/01/2019   Lab Results  Component Value Date   ALT 22 03/01/2019   AST 22 03/01/2019   ALKPHOS 50 03/01/2019   BILITOT 0.5 03/01/2019   Lab Results  Component Value Date   CHOL 211 (H) 03/01/2019   Lab Results  Component Value Date   HDL 53.20 03/01/2019   Lab Results  Component Value Date   LDLCALC 138 (H) 03/01/2019   Lab Results  Component Value Date   TRIG 97.0 03/01/2019    Lab Results  Component Value Date   CHOLHDL 4 03/01/2019   Lab Results  Component Value Date   PSA 6.01 (H) 03/01/2019   PSA 3.73 11/03/2017   PSA 3.0 08/10/2016   Lab Results  Component Value Date   HGBA1C 8.3 (H) 03/01/2019    IMPRESSION AND PLAN:  1) Uncontrolled HTN.  Tolerating lisinopril well but bp still far from goal. We'll go up in small increments: increased dose to 20mg  qd today.  Now can exercise more b/c the off-loading boot is off.  An After Visit Summary was printed and given  to the patient.  FOLLOW UP: Return in about 4 weeks (around 05/09/2019) for f/u HTN.  Signed:  Santiago Bumpers, MD           04/11/2019

## 2019-04-19 NOTE — Progress Notes (Signed)
FADI, MENTER ([id number]) Visit Report for 02/16/2019 Arrival Information Details Patient Name: Date of Service: [name], [name] Medical Record [id number] Patient Account Number: [id number] Date of Birth/Sex: Treating RN: 01-15-1955 (64 y.o. Lorette Ang, Meta.Reding Primary Care Trygve Thal: Shawnie Dapper Other Clinician: Referring Mirna Sutcliffe: Treating Pierre Cumpton/Extender:Robson, [name], [name] Weeks in Treatment: 8 Visit Information History Since Last Visit Added or deleted any medications: No Patient Arrived: Ambulatory Any new allergies or adverse reactions: No Arrival Time: 09:29 Had a fall or experienced change in No Accompanied By: self activities of daily living that may affect Transfer Assistance: None risk of falls: Patient Identification Verified: Yes Signs or symptoms of abuse/neglect since last No Secondary Verification Process Yes visito Completed: Hospitalized since last visit: No Patient Requires Transmission- No Implantable device outside of the clinic excluding No Based Precautions: cellular tissue based products placed in the center Patient Has Alerts: Yes since last visit: Patient Alerts: Right ABI: non Has Dressing in Place as Prescribed: Yes compress Pain Present Now: Yes Electronic Signature(s) Signed: 04/19/2019 9:23:26 [name] By: Sandre Kitty Entered By: Sandre Kitty on 02/16/2019 09:29:27 -------------------------------------------------------------------------------- Clinic Level of Care Assessment Details Patient Name: Date of Service: [name], [name] Medical Record [id number] Patient Account Number: [id number] Date of Birth/Sex: Treating RN: [birth year] (64 y.o. Hessie Diener Primary Care Kamila Broda: Shawnie Dapper Other Clinician: Referring Belvin Gauss: Treating Sherlene Rickel/Extender:Robson, [name], [name] Weeks in Treatment: 8 Clinic Level of Care Assessment  Items TOOL 4 Quantity Score X - Use when only an EandM is performed on FOLLOW-UP visit 1 0 ASSESSMENTS - Nursing Assessment / Reassessment X - Reassessment of Co-morbidities (includes updates in patient status) 1 10 X - Reassessment of Adherence to Treatment Plan 1 5 ASSESSMENTS - Wound and Skin Assessment / Reassessment X - Simple Wound Assessment / Reassessment - one wound 1 5 []  - Complex Wound Assessment / Reassessment - multiple wounds 0 X - Dermatologic / Skin Assessment (not related to wound area) 1 10 ASSESSMENTS - Focused Assessment X - Circumferential Edema Measurements - multi extremities 1 5 X - Nutritional Assessment / Counseling / Intervention 1 10 []  - Lower Extremity Assessment (monofilament, tuning fork, pulses) 0 []  - Peripheral Arterial Disease Assessment (using hand held doppler) 0 ASSESSMENTS - Ostomy and/or Continence Assessment and Care []  - Incontinence Assessment and Management 0 []  - Ostomy Care Assessment and Management (repouching, etc.) 0 PROCESS - Coordination of Care X - Simple Patient / Family Education for ongoing care 1 15 []  - Complex (extensive) Patient / Family Education for ongoing care 0 X - Staff obtains Programmer, systems, Records, Test Results / Process Orders 1 10 []  - Staff telephones HHA, Nursing Homes / Clarify orders / etc 0 []  - Routine Transfer to another Facility (non-emergent condition) 0 []  - Routine Hospital Admission (non-emergent condition) 0 []  - New Admissions / Biomedical engineer / Ordering NPWT, Apligraf, etc. 0 []  - Emergency Hospital Admission (emergent condition) 0 X - Simple Discharge Coordination 1 10 []  - Complex (extensive) Discharge Coordination 0 PROCESS - Special Needs []  - Pediatric / Minor Patient Management 0 []  - Isolation Patient Management 0 []  - Hearing / Language / Visual special needs 0 []  - Assessment of Community assistance (transportation, D/C planning, etc.) 0 []  - Additional assistance / Altered mentation  0 []  - Support Surface(s) Assessment (bed, cushion, seat, etc.) 0 INTERVENTIONS - Wound Cleansing / Measurement X - Simple Wound Cleansing - one wound 1 5 []  - Complex Wound Cleansing -  multiple wounds 0 X - Wound Imaging (photographs - any number of wounds) 1 5 []  - Wound Tracing (instead of photographs) 0 X - Simple Wound Measurement - one wound 1 5 []  - Complex Wound Measurement - multiple wounds 0 INTERVENTIONS - Wound Dressings X - Small Wound Dressing one or multiple wounds 1 10 []  - Medium Wound Dressing one or multiple wounds 0 []  - Large Wound Dressing one or multiple wounds 0 []  - Application of Medications - topical 0 []  - Application of Medications - injection 0 INTERVENTIONS - Miscellaneous []  - External ear exam 0 []  - Specimen Collection (cultures, biopsies, blood, body fluids, etc.) 0 []  - Specimen(s) / Culture(s) sent or taken to Lab for analysis 0 []  - Patient Transfer (multiple staff / / Similar devices) 0 []  - Simple Staple / Suture removal (25 or less) 0 []  - Complex Staple / Suture removal (26 or more) 0 []  - Hypo / Hyperglycemic Management (close monitor of Blood Glucose) 0 []  - Ankle / Brachial Index (ABI) - do not check if billed separately 0 X - Vital Signs 1 5 Has the patient been seen at the hospital within the last three years: Yes Total Score: 110 Level Of Care: New/Established - Level 3 Electronic Signature(s) Signed: 02/16/2019 5:57:11 PM By: Entered By: on 02/16/2019 09:57:22 -------------------------------------------------------------------------------- Encounter Discharge Information Details Patient Name: Date of Service: 02/16/2019 9:30 [name] Medical Record Patient Account Number: Date of Birth/Sex: Treating RN: 05-22-1955 (64 y.o. Primary Care Anani Gu: Other Clinician: Referring Leeanne Butters: Treating  Jader Desai/Extender:Robson, , [name] Weeks in Treatment: 8 Encounter Discharge Information Items Discharge Condition: Stable Ambulatory Status: Ambulatory Discharge Destination: Home Transportation: Private Auto Accompanied By: self Schedule Follow-up Appointment: Yes Clinical Summary of Care: Patient Declined Notes BP 183/92 Electronic Signature(s) Signed: 02/17/2019 5:30:09 PM By: 04/16/2019 RN, BSN Entered By: Shawn Stall on 02/16/2019 10:18:37 -------------------------------------------------------------------------------- Lower Extremity Assessment Details Patient Name: Date of Service: [name], [name] Medical Record 04/16/2019 Patient Account Number: [id number] Date of Birth/Sex: Treating RN: Nov 08, 1955 (64 y.o. 64 Primary Care Drena Ham: Damaris Schooner Other Clinician: Referring Early Ord: Treating Ammarie Matsuura/Extender:Robson, Nicoletta Ba, [name] Weeks in Treatment: 8 Edema Assessment Assessed: [Left: No] [Right: No] Edema: [Left: N] [Right: o] Calf Left: Right: Point of Measurement: 35 cm From Medial Instep cm 43.7 cm Ankle Left: Right: Point of Measurement: 7 cm From Medial Instep cm 25.4 cm Vascular Assessment Pulses: Dorsalis Pedis Palpable: [Right:Yes] Electronic Signature(s) Signed: 02/17/2019 5:30:09 PM By: 04/17/2019 RN, BSN Entered By: Zenaida Deed on 02/16/2019 09:45:42 -------------------------------------------------------------------------------- Multi Wound Chart Details Patient Name: Date of Service: 04/16/2019 02/16/2019 9:30 [name] Medical Record 04/16/2019 Patient Account Number: [id number] Date of Birth/Sex: Treating RN: December 02, 1955 (64 y.o. 64, Damaris Schooner Primary Care Sanaai Doane: Nicoletta Ba Other Clinician: Referring Timiyah Romito: Treating Pablo Stauffer/Extender:Robson, Lottie Rater, [name] Weeks in Treatment: 8 Vital Signs Height(in): 72 Capillary Blood  132 Glucose(mg/dl): Weight(lbs): 04/17/2019 Pulse(bpm): 92 Body Mass Index(BMI): 30 Blood Pressure(mmHg): Temperature(F): 98.6 Respiratory 18 Rate(breaths/min): Photos: [3:No Photos] [N/A:N/A] Wound Location: [3:Toe Great - Medial] [N/A:N/A] Wounding Event: [3:Blister] [N/A:N/A] Primary Etiology: [3:Diabetic Wound/Ulcer of the N/A Lower Extremity] Comorbid History: [3:Hypertension, Type II Diabetes, Neuropathy] [N/A:N/A] Date Acquired: [3:09/06/2018] [N/A:N/A] Weeks of Treatment: [3:8] [N/A:N/A] Wound Status: [3:Open] [N/A:N/A] Measurements L x W x D 0.2x0.2x0.2 [N/A:N/A] (cm) Area (cm) : [3:0.031] [N/A:N/A] Volume (cm) : [3:0.006] [N/A:N/A] % Reduction in Area: [3:96.50%] [N/A:N/A] %  Reduction in Volume: 99.60% [N/A:N/A] Starting Position 1 12 (o'clock): Ending Position 1 [3:12] (o'clock): Maximum Distance 1 [3:0.1] (cm): Undermining: [3:Yes] [N/A:N/A] Classification: [3:Grade 2] [N/A:N/A] Exudate Amount: [3:Small] [N/A:N/A] Exudate Type: [3:Serosanguineous] [N/A:N/A] Exudate Color: [3:red, brown] [N/A:N/A] Wound Margin: [3:Well defined, not attached] [N/A:N/A] Granulation Amount: [3:Large (67-100%)] [N/A:N/A] Granulation Quality: [3:Red] [N/A:N/A] Necrotic Amount: [3:None Present (0%)] [N/A:N/A] Exposed Structures: [3:Fat Layer (Subcutaneous Tissue) Exposed: Yes Fascia: No Tendon: No Muscle: No Joint: No Bone: No Medium (34-66%)] [N/A:N/A N/A] Treatment Notes Electronic Signature(s) Signed: 02/16/2019 5:57:11 PM By: Shawn Stall Signed: 02/17/2019 1:06:39 PM By: Baltazar Najjar MD Entered By: Baltazar Najjar on 02/16/2019 10:01:46 -------------------------------------------------------------------------------- Multi-Disciplinary Care Plan Details Patient Name: Date of Service: Joel Ehlers. 02/16/2019 9:30 [name] Medical Record [id number] Patient Account Number: [id number] Date of Birth/Sex: Treating RN: Oct 11, 1955 (64 y.o. [name] Primary Care  Kade Demicco: Nicoletta Ba Other Clinician: Referring Tyrell Seifer: Treating Talena Neira/Extender:Robson, Lottie Rater, [name] Weeks in Treatment: 8 Active Inactive Wound/Skin Impairment Nursing Diagnoses: Knowledge deficit related to ulceration/compromised skin integrity Goals: Patient/caregiver will verbalize understanding of skin care regimen Date Initiated: 12/22/2018 Target Resolution Date: 04/07/2019 Goal Status: Active Interventions: Assess patient/caregiver ability to obtain necessary supplies Assess patient/caregiver ability to perform ulcer/skin care regimen upon admission and as needed Assess ulceration(s) every visit Provide education on ulcer and skin care Treatment Activities: Skin care regimen initiated : 12/22/2018 Topical wound management initiated : 12/22/2018 Notes: Electronic Signature(s) Signed: 02/16/2019 5:57:11 PM By: Shawn Stall Entered By: Shawn Stall on 02/16/2019 09:55:11 -------------------------------------------------------------------------------- Pain Assessment Details Patient Name: Date of Service: [name], [name] Medical Record [id number] Patient Account Number: [id number] Date of Birth/Sex: Treating RN: August 26, 1955 (64 y.o. [name] Primary Care Charon Smedberg: Nicoletta Ba Other Clinician: Referring Audris Speaker: Treating Lj Miyamoto/Extender:Robson, Lottie Rater, [name] Weeks in Treatment: 8 Active Problems Location of Pain Severity and Description of Pain Patient Has Paino Yes Site Locations Rate the pain. Current Pain Level: 3 Pain Management and Medication Current Pain Management: Electronic Signature(s) Signed: 02/16/2019 5:57:11 PM By: Shawn Stall Signed: 04/19/2019 9:23:26 [name] By: Karl Ito Entered By: Karl Ito on 02/16/2019 09:30:01 -------------------------------------------------------------------------------- Patient/Caregiver Education Details Howie Ill  2/11/2021andnbsp9:30 Patient Name: Date of Service: J. [name] Medical Record Patient Account Number: [id number] Number: Treating RN: Shawn Stall Date of Birth/Gender: 09/02/1955 (63 y.o. M) Other Clinician: Primary Care Physician: Tedd Sias Referring Physician: Physician/Extender: Santo Held in Treatment: 8 Education Assessment Education Provided To: Patient Education Topics Provided Wound/Skin Impairment: Handouts: Skin Care Do's and Dont's Methods: Explain/Verbal Responses: Reinforcements needed Electronic Signature(s) Signed: 02/16/2019 5:57:11 PM By: Shawn Stall Entered By: Shawn Stall on 02/16/2019 09:55:27 -------------------------------------------------------------------------------- Wound Assessment Details Patient Name: Date of Service: [name], ARPS 02/16/2019 9:30 [name] Medical Record [id number] Patient Account Number: [id number] Date of Birth/Sex: Treating RN: May 11, 1955 (64 y.o. [name] Primary Care Court Gracia: Nicoletta Ba Other Clinician: Referring Dalten Ambrosino: Treating Shanina Kepple/Extender:Robson, Lottie Rater, [name] Weeks in Treatment: 8 Wound Status Wound Number: 3 Primary Diabetic Wound/Ulcer of the Lower Etiology: Extremity Wound Location: Medial Toe Great Wound Status: Open Wounding Event: Blister Comorbid Hypertension, Type II Diabetes, Date Acquired: 09/06/2018 History: Neuropathy Weeks Of Treatment: 8 Clustered Wound: No Photos Wound Measurements Length: (cm) 0.2 Width: (cm) 0.2 Depth: (cm) 0.2 Area: (cm) 0.031 Volume: (cm) 0.006 % Reduction in Area: 96.5% % Reduction in Volume: 99.6% Epithelialization: Medium (34-66%) Tunneling: No Undermining: Yes Starting Position (o'clock): 12 Ending Position (o'clock): 12 Maximum Distance: (cm) 0.1 Wound Description Classification: Grade 2 Foul Odor A  Wound Margin: Well defined, not attached Slough/Fibr Exudate Amount:  Small Exudate Type: Serosanguineous Exudate Color: red, brown Wound Bed Granulation Amount: Large (67-100%) Granulation Quality: Red Fascia Expos Necrotic Amount: None Present (0%) Fat Layer (S Tendon Expos Muscle Expos Joint Expose Bone Exposed fter Cleansing: No ino No Exposed Structure ed: No ubcutaneous Tissue) Exposed: Yes ed: No ed: No d: No : No Electronic Signature(s) Signed: 02/16/2019 4:30:47 PM By: Benjaman Kindler EMT/HBOT Signed: 02/16/2019 5:57:11 PM By: Shawn Stall Entered By: Benjaman Kindler on 02/16/2019 13:27:30 -------------------------------------------------------------------------------- Vitals Details Patient Name: Date of Service: [name] Medical Record [id number] Patient Account Number: [id number] Date of Birth/Sex: Treating RN: 02/08/1955 (63 y.o. [name] Primary Care Scheryl Sanborn: Nicoletta Ba Other Clinician: Referring Vennie Salsbury: Treating Aradhana Gin/Extender:Robson, Lottie Rater, [name] Weeks in Treatment: 8 Vital Signs Time Taken: 09:29 Temperature (F): 98.6 Height (in): 72 Pulse (bpm): 92 Weight (lbs): 220 Respiratory Rate (breaths/min): 18 Body Mass Index (BMI): 29.8 Capillary Blood Glucose (mg/dl): 854 Reference Range: 80 - 120 mg / dl Electronic Signature(s) Signed: 04/19/2019 9:23:26 [name] By: Karl Ito Entered By: Karl Ito on 02/16/2019 09:29:49

## 2019-04-30 ENCOUNTER — Other Ambulatory Visit: Payer: Self-pay | Admitting: Family Medicine

## 2019-05-09 ENCOUNTER — Ambulatory Visit: Payer: BC Managed Care – PPO | Admitting: Family Medicine

## 2019-05-09 DIAGNOSIS — Z0289 Encounter for other administrative examinations: Secondary | ICD-10-CM

## 2019-05-09 NOTE — Progress Notes (Deleted)
OFFICE VISIT  05/09/2019   CC: No chief complaint on file.  HPI:    Patient is a 64 y.o. Caucasian male who presents for 1 mo f/u uncontrolled HTN in the context of comorbid DM 2 which is poorly controlled and with complications. Last visit we up-titrated his lisinopril from 10 to 20mg  qd.  INTERIM HX: ***   Past Medical History:  Diagnosis Date  . Colon cancer screening    03/2014 iFOB negative.  . Diabetes mellitus with complication (HCC) 09/12/10   Has mild DPN.  HbA1c 12.6% at the time of dx.  . Elevated blood pressure reading without diagnosis of hypertension 12/26/2010   White coat HTN  . 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  . Hyperlipidemia   . Increased prostate specific antigen (PSA) velocity 2016   ? due to cycling?  Pt chose repeat PSA instead of Biopsy and it showed lower PSA value when he had not been cycling prior to PSA check.  . Obesity, Class I, BMI 30-34.9   . Toe ulcer due to DM [facility]) fall 2012   Right great toe    Past Surgical History:  Procedure Laterality Date  . ROOT CANAL    . TONSILLECTOMY      Outpatient Medications Prior to Visit  Medication Sig Dispense Refill  . atorvastatin (LIPITOR) 40 MG tablet Take 1 tablet (40 mg total) by mouth daily. (Patient not taking: Reported on 03/01/2019) 90 tablet 3  . B-D ULTRAFINE III SHORT PEN 31G X 8 MM MISC daily. use as directed    . Cyanocobalamin (VITAMIN B 12 PO) Take by mouth.    . EPINEPHrine 0.3 mg/0.3 mL IJ SOAJ injection Inject 0.3 mLs (0.3 mg total) into the muscle once. (Patient not taking: Reported on 03/01/2019) 2 Device 1  . glucose blood (FREESTYLE LITE) test strip Use to check blood sugar twice daily as directed 100 each 5  . Insulin Pen Needle (B-D ULTRAFINE III SHORT PEN) 31G X 8 MM MISC USE AS DIRECTED ONCE DAILY 100 each 0  . Lancet Devices (B-D LANCET DEVICE) MISC Use as directed to check blood sugars 2 times per day 60  each 5  . liraglutide (VICTOZA) 18 MG/3ML SOPN INJECT 1.8 MG SUBCUTANEOUSLY ONCE DAILY 15 mL 0  . lisinopril (ZESTRIL) 20 MG tablet Take 1 tablet (20 mg total) by mouth daily. 30 tablet 1  . metFORMIN (GLUCOPHAGE) 500 MG tablet TAKE 2 TABLETS BY MOUTH TWICE DAILY WITH MEALS 360 tablet 0  . Multiple Vitamins-Minerals (CENTRUM PO) Take 1 tablet by mouth daily.      . pioglitazone (ACTOS) 45 MG tablet Take 1 tablet by mouth once daily 90 tablet 0  . Probiotic Product (PROBIOTIC-10 PO) Take 1 capsule by mouth daily.     No facility-administered medications prior to visit.    Allergies  Allergen Reactions  . Other     Peanuts - choking     ROS As per HPI  PE: There were no vitals taken for this visit. ***  LABS:    Chemistry      Component Value Date/Time   NA 137 03/01/2019 1033   K 4.4 03/01/2019 1033   CL 100 03/01/2019 1033   CO2 27 03/01/2019 1033   BUN 20 03/01/2019 1033   CREATININE 0.89 03/01/2019 1033   CREATININE 0.76 08/10/2016 1035      Component Value Date/Time   CALCIUM  10.0 03/01/2019 1033   ALKPHOS 50 03/01/2019 1033   AST 22 03/01/2019 1033   ALT 22 03/01/2019 1033   BILITOT 0.5 03/01/2019 1033     Lab Results  Component Value Date   HGBA1C 8.3 (H) 03/01/2019   Lab Results  Component Value Date   CHOL 211 (H) 03/01/2019   HDL 53.20 03/01/2019   LDLCALC 138 (H) 03/01/2019   LDLDIRECT 156.2 09/26/2012   TRIG 97.0 03/01/2019   CHOLHDL 4 03/01/2019   IMPRESSION AND PLAN:  No problem-specific Assessment & Plan notes found for this encounter.   An After Visit Summary was printed and given to the patient.  FOLLOW UP: No follow-ups on file.  Signed:  Crissie Sickles, MD           05/09/2019

## 2019-05-16 ENCOUNTER — Other Ambulatory Visit: Payer: Self-pay | Admitting: Family Medicine

## 2019-05-23 ENCOUNTER — Encounter (HOSPITAL_BASED_OUTPATIENT_CLINIC_OR_DEPARTMENT_OTHER): Payer: BC Managed Care – PPO | Attending: Internal Medicine | Admitting: Internal Medicine

## 2019-05-23 DIAGNOSIS — I10 Essential (primary) hypertension: Secondary | ICD-10-CM | POA: Diagnosis not present

## 2019-05-23 DIAGNOSIS — E11621 Type 2 diabetes mellitus with foot ulcer: Secondary | ICD-10-CM | POA: Diagnosis not present

## 2019-05-23 DIAGNOSIS — L97528 Non-pressure chronic ulcer of other part of left foot with other specified severity: Secondary | ICD-10-CM | POA: Diagnosis present

## 2019-05-23 DIAGNOSIS — E1142 Type 2 diabetes mellitus with diabetic polyneuropathy: Secondary | ICD-10-CM | POA: Insufficient documentation

## 2019-05-23 DIAGNOSIS — E785 Hyperlipidemia, unspecified: Secondary | ICD-10-CM | POA: Diagnosis not present

## 2019-05-23 NOTE — Progress Notes (Signed)
[name], [name] ([id number]) Visit Report for 05/23/2019 Abuse/Suicide Risk Screen Details Patient Name: Date of Service: [name] J. 05/23/2019 8:00 A M Medical Record Number: [id number] Patient Account Number: [id number] Date of Birth/Sex: Treating RN: 1955/02/24 (65 y.o. [name] Primary Care [name] [name] Weeks in Treatment: 0 Abuse/Suicide Risk Screen Items Answer ABUSE RISK SCREEN: Has anyone close to you tried to hurt or harm you recentlyo No Do you feel uncomfortable with anyone in your familyo No Has anyone forced you do things that you didnt want to doo No Electronic Signature(s) Signed: 05/23/2019 5:27:28 PM By: Baruch Gouty RN, BSN Entered By: Baruch Gouty on 05/23/2019 51:76:16 -------------------------------------------------------------------------------- Activities of Daily Living Details Patient Name: Date of Service: [name], [name] 05/23/2019 8:00 A M Medical Record Number: [id number] Patient Account Number: [id number] Date of Birth/Sex: Treating RN: 1956-01-04 (64 y.o. [name] Primary Care Dorlene Footman: [name] Joel Clinician: Referring Deng Kemler: Treating Amabel Stmarie/Extender: Leonides Schanz, [name] Weeks in Treatment: 0 Activities of Daily Living Items Answer Activities of Daily Living (Please select one for each item) Drive Automobile Completely Able T Medications ake Completely Able Use T elephone Completely Able Care for Appearance Completely Able Use T oilet Completely Able Bath / Shower Completely Able Dress Self Completely Able Feed Self Completely Able Walk Completely Able Get In / Out Bed Completely Able Housework Completely Able Prepare Meals Completely [place] Completely Able Shop for Self Completely Able Electronic Signature(s) Signed: 05/23/2019 5:27:28 PM  By: Baruch Gouty RN, BSN Entered By: Baruch Gouty on 05/23/2019 94:85:46 -------------------------------------------------------------------------------- Education Screening Details Patient Name: Date of Service: Joel Bishop [place], [name] 05/23/2019 8:00 A M Medical Record Number: [id number] Patient Account Number: [id number] Date of Birth/Sex: Treating RN: [birth year] (64 y.o. [name] Primary Care Abigail Marsiglia: [name] Joel Clinician: Referring Ayden Apodaca: Treating Darneisha Windhorst/Extender: Leonides Schanz, [name] Weeks in Treatment: 0 Primary Learner Assessed: Patient Learning Preferences/Education Level/Primary Language Learning Preference: Explanation, Demonstration, Printed Material Highest Education Level: College or Above Preferred Language: English Cognitive Barrier Language Barrier: No Translator Needed: No Memory Deficit: No Emotional Barrier: No Cultural/Religious Beliefs Affecting Medical Care: No Physical Barrier Impaired Vision: No Impaired Hearing: No Decreased Hand dexterity: No Knowledge/Comprehension Knowledge Level: High Comprehension Level: High Ability to understand written instructions: High Ability to understand verbal instructions: High Motivation Anxiety Level: Calm Cooperation: Cooperative Education Importance: Acknowledges Need Interest in Health Problems: Asks Questions Perception: Coherent Willingness to Engage in Self-Management High Activities: Readiness to Engage in Self-Management High Activities: Electronic Signature(s) Signed: 05/23/2019 5:27:28 PM By: Baruch Gouty RN, BSN Entered By: Baruch Gouty on 05/23/2019 81:82:99 -------------------------------------------------------------------------------- Fall Risk Assessment Details Patient Name: Date of Service: Joel Bishop [place], [name] 05/23/2019 8:00 A M Medical Record Number: [id number] Patient Account Number: [id number] Date of Birth/Sex: Treating  RN: [birth year] (64 y.o. [name] Primary Care Wednesday Ericsson: [name] Joel Clinician: Referring Gaylen Pereira: Treating Rakim Moone/Extender: Leonides Schanz, [name] Weeks in Treatment: 0 Fall Risk Assessment Items Have you had 2 or more falls in the last 12 monthso 0 No Have you had any fall that resulted in injury in the last 12 monthso 0 No FALLS RISK SCREEN History of falling - immediate or within 3 months 0 No Secondary diagnosis (Do you have 2 or more medical diagnoseso) 0 No Ambulatory aid None/bed rest/wheelchair/nurse 0 Yes Crutches/cane/walker 0 No Furniture 0 No  Intravenous therapy Access/Saline/Heparin Lock 0 No Gait/Transferring Normal/ bed rest/ wheelchair 0 Yes Weak (short steps with or without shuffle, stooped but able to lift head while walking, may seek 0 No support from furniture) Impaired (short steps with shuffle, may have difficulty arising from chair, head down, impaired 0 No balance) Mental Status Oriented to own ability 0 Yes Electronic Signature(s) Signed: 05/23/2019 5:27:28 PM By: Zenaida Deed RN, BSN Entered By: Zenaida Deed on 05/23/2019 08:28:07 -------------------------------------------------------------------------------- Foot Assessment Details Patient Name: Date of Service: Joel Blalock [place], [name] 05/23/2019 8:00 A M Medical Record Number: [id number] Patient Account Number: [id number] Date of Birth/Sex: Treating RN: [birth year] (65 y.o. [name] Primary Care Klynn Linnemann: Marney Setting, [name] Joel Clinician: Referring Crayton Savarese: Treating Kingjames Coury/Extender: Townsend Roger, [name] Weeks in Treatment: 0 Foot Assessment Items Site Locations + = Sensation present, - = Sensation absent, C = Callus, U = Ulcer R = Redness, W = Warmth, M = Maceration, PU = Pre-ulcerative lesion F = Fissure, S = Swelling, D = Dryness Assessment Right: Left: Joel Deformity: No No Prior Foot Ulcer: Yes No Prior Amputation: No  No Charcot Joint: Yes No Ambulatory Status: Ambulatory Without Help Gait: Steady Electronic Signature(s) Signed: 05/23/2019 5:27:28 PM By: Zenaida Deed RN, BSN Entered By: Zenaida Deed on 05/23/2019 08:30:56 -------------------------------------------------------------------------------- Nutrition Risk Screening Details Patient Name: Date of Service: Joel Blalock [place], [name] 05/23/2019 8:00 A M Medical Record Number: [id number] Patient Account Number: [id number] Date of Birth/Sex: Treating RN: [birth year] (64 y.o. [name] Primary Care Aretha Levi: Marney Setting, [name] Joel Clinician: Referring Ivar Domangue: Treating Marea Reasner/Extender: Baltazar Najjar [facility] WEN, [name] Weeks in Treatment: 0 Height (in): 72 Weight (lbs): 240 Body Mass Index (BMI): 32.5 Nutrition Risk Screening Items Score Screening NUTRITION RISK SCREEN: I have an illness or condition that made me change the kind and/or amount of food I eat 0 No I eat fewer than two meals per day 0 No I eat few fruits and vegetables, or milk products 0 No I have three or more drinks of beer, liquor or wine almost every day 0 No I have tooth or mouth problems that make it hard for me to eat 0 No I don't always have enough money to buy the food I need 0 No I eat alone most of the time 0 No I take three or more different prescribed or over-the-counter drugs a day 1 Yes Without wanting to, I have lost or gained 10 pounds in the last six months 0 No I am not always physically able to shop, cook and/or feed myself 0 No Nutrition Protocols Good Risk Protocol 0 No interventions needed Moderate Risk Protocol High Risk Proctocol Risk Level: Good Risk Score: 1 Electronic Signature(s) Signed: 05/23/2019 5:27:28 PM By: Zenaida Deed RN, BSN Entered By: Zenaida Deed on 05/23/2019 40:34:74

## 2019-05-25 ENCOUNTER — Encounter (HOSPITAL_BASED_OUTPATIENT_CLINIC_OR_DEPARTMENT_OTHER): Payer: BC Managed Care – PPO | Admitting: Internal Medicine

## 2019-05-25 NOTE — Progress Notes (Signed)
[name] ([id number]) Visit Report for 05/23/2019 Allergy List Details Patient Name: Date of Service: [name] [facility] J. 05/23/2019 8:00 A M Medical Record Number: [id number] Patient Account Number: [id number] Date of Birth/Sex: Treating RN: 11/30/1955 (64 y.o. Damaris Schooner Primary Care Provider: Marney Setting, [name] Other Clinician: Referring Provider: Treating Provider/Extender: Baltazar Najjar [facility] WEN, [name] Weeks in Treatment: 0 Allergies Active Allergies peanut Reaction: anaphylaxis Severity: Severe Allergy Notes Electronic Signature(s) Signed: 05/23/2019 5:27:28 PM By: Zenaida Deed RN, BSN Entered By: Zenaida Deed on 05/23/2019 08:22:03 -------------------------------------------------------------------------------- Arrival Information Details Patient Name: Date of Service: Joel Blalock [place], [name] 05/23/2019 8:00 A M Medical Record Number: [id number] Patient Account Number: [id number] Date of Birth/Sex: Treating RN: 1955/03/27 (64 y.o. Damaris Schooner Primary Care Provider: Marney Setting, [name] Other Clinician: Referring Provider: Treating Provider/Extender: Townsend Roger, [name] Weeks in Treatment: 0 Visit Information Patient Arrived: Ambulatory Arrival Time: 08:13 Accompanied By: self Transfer Assistance: None Patient Identification Verified: Yes Secondary Verification Process Completed: Yes Patient Requires Transmission-Based Precautions: No Patient Has Alerts: No History Since Last Visit Electronic Signature(s) Signed: 05/23/2019 5:27:28 PM By: Zenaida Deed RN, BSN Entered By: Zenaida Deed on 05/23/2019 08:20:11 -------------------------------------------------------------------------------- Clinic Level of Care Assessment Details Patient Name: Date of Service: [facility] MS, [name] 05/23/2019 8:00 A M Medical Record Number: [id number] Patient Account Number: [id number] Date of Birth/Sex: Treating RN: January 20, 1955 (63 y.o.  Judie Petit) Yevonne Pax Primary Care Provider: Marney Setting, [name] Other Clinician: Referring Provider: Treating Provider/Extender: Baltazar Najjar [facility] WEN, [name] Weeks in Treatment: 0 Clinic Level of Care Assessment Items TOOL 2 Quantity Score X- 1 0 Use when only an EandM is performed on the INITIAL visit ASSESSMENTS - Nursing Assessment / Reassessment X- 1 20 General Physical Exam (combine w/ comprehensive assessment (listed just below) when performed on new pt. evals) X- 1 25 Comprehensive Assessment (HX, ROS, Risk Assessments, Wounds Hx, etc.) ASSESSMENTS - Wound and Skin A ssessment / Reassessment []  - 0 Simple Wound Assessment / Reassessment - one wound []  - 0 Complex Wound Assessment / Reassessment - multiple wounds []  - 0 Dermatologic / Skin Assessment (not related to wound area) ASSESSMENTS - Ostomy and/or Continence Assessment and Care []  - 0 Incontinence Assessment and Management []  - 0 Ostomy Care Assessment and Management (repouching, etc.) PROCESS - Coordination of Care X - Simple Patient / Family Education for ongoing care 1 15 []  - 0 Complex (extensive) Patient / Family Education for ongoing care X- 1 10 Staff obtains , Records, T Results / Process Orders est []  - 0 Staff telephones HHA, Nursing Homes / Clarify orders / etc []  - 0 Routine Transfer to another Facility (non-emergent condition) []  - 0 Routine Hospital Admission (non-emergent condition) X- 1 15 New Admissions / / Ordering NPWT Apligraf, etc. , []  - 0 Emergency Hospital Admission (emergent condition) X- 1 10 Simple Discharge Coordination []  - 0 Complex (extensive) Discharge Coordination PROCESS - Special Needs []  - 0 Pediatric / Minor Patient Management []  - 0 Isolation Patient Management []  - 0 Hearing / Language / Visual special needs []  - 0 Assessment of Community assistance (transportation, D/C planning, etc.) []  - 0 Additional assistance / Altered  mentation []  - 0 Support Surface(s) Assessment (bed, cushion, seat, etc.) INTERVENTIONS - Wound Cleansing / Measurement []  - 0 Wound Imaging (photographs - any number of wounds) []  - 0 Wound Tracing (instead of photographs) []  - 0 Simple Wound Measurement - one wound []  - 0 Complex  Wound Measurement - multiple wounds []  - 0 Simple Wound Cleansing - one wound []  - 0 Complex Wound Cleansing - multiple wounds INTERVENTIONS - Wound Dressings []  - 0 Small Wound Dressing one or multiple wounds []  - 0 Medium Wound Dressing one or multiple wounds []  - 0 Large Wound Dressing one or multiple wounds []  - 0 Application of Medications - injection INTERVENTIONS - Miscellaneous []  - 0 External ear exam []  - 0 Specimen Collection (cultures, biopsies, blood, body fluids, etc.) []  - 0 Specimen(s) / Culture(s) sent or taken to Lab for analysis []  - 0 Patient Transfer (multiple staff / Civil Service fast streamer / Similar devices) []  - 0 Simple Staple / Suture removal (25 or less) []  - 0 Complex Staple / Suture removal (26 or more) []  - 0 Hypo / Hyperglycemic Management (close monitor of Blood Glucose) X- 1 15 Ankle / Brachial Index (ABI) - do not check if billed separately Has the patient been seen at the hospital within the last three years: Yes Total Score: 110 Level Of Care: New/Established - Level 3 Electronic Signature(s) Signed: 05/23/2019 5:13:38 PM By: Carlene Coria RN Entered By: Carlene Coria on 05/23/2019 13:02:13 -------------------------------------------------------------------------------- Encounter Discharge Information Details Patient Name: Date of Service: Joel Bishop [place], [place]. 05/23/2019 8:00 A M Medical Record Number: [id number] Patient Account Number: [id number] Date of Birth/Sex: Treating RN: July 04, 1955 (63 y.o. Marvis Repress Primary Care Provider: [name], [name] Other Clinician: Referring Provider: Treating Provider/Extender: Leonides Schanz, [name] Weeks  in Treatment: 0 Encounter Discharge Information Items Discharge Condition: Stable Ambulatory Status: Ambulatory Discharge Destination: Home Transportation: Private Auto Accompanied By: self Schedule Follow-up Appointment: Yes Clinical Summary of Care: Patient Declined Electronic Signature(s) Signed: 05/23/2019 5:47:21 PM By: Kela Millin Entered By: Kela Millin on 05/23/2019 09:28:17 -------------------------------------------------------------------------------- Lower Extremity Assessment Details Patient Name: Date of Service: Joel Bishop [place], [place]. 05/23/2019 8:00 A M Medical Record Number: [id number] Patient Account Number: [id number] Date of Birth/Sex: Treating RN: 08-06-1955 (64 y.o. [name] Primary Care Provider: [name], [name] Other Clinician: Referring Provider: Treating Provider/Extender: Leonides Schanz, [name] Weeks in Treatment: 0 Edema Assessment Assessed: [Left: No] [Right: No] Edema: [Left: N] [Right: o] Calf Left: Right: Point of Measurement: cm From Medial Instep 41.3 cm cm Ankle Left: Right: Point of Measurement: cm From Medial Instep 24 cm cm Vascular Assessment Pulses: Dorsalis Pedis Palpable: [Left:No] Posterior Tibial Palpable: [Left:Yes] Blood Pressure: Brachial: [Left:179] Notes left DP and PT pulses noncompressible Electronic Signature(s) Signed: 05/23/2019 5:27:28 PM By: Baruch Gouty RN, BSN Entered By: Baruch Gouty on 05/23/2019 08:44:20 -------------------------------------------------------------------------------- Multi Wound Chart Details Patient Name: Date of Service: Joel Bishop [place], [place]. 05/23/2019 8:00 A M Medical Record Number: [id number] Patient Account Number: [id number] Date of Birth/Sex: Treating RN: 1955-03-13 (63 y.o. Jerilynn Mages) Carlene Coria Primary Care Provider: [name], [name] Other Clinician: Referring Provider: Treating Provider/Extender: Leonides Schanz, [name] Weeks in  Treatment: 0 Vital Signs Height(in): 72 Capillary Blood Glucose(mg/dl): 150 Weight(lbs): 240 Pulse(bpm): 89 Body Mass Index(BMI): 33 Blood Pressure(mmHg): 179/86 Temperature(F): 98.2 Respiratory Rate(breaths/min): 18 Wound Assessments Treatment Notes Electronic Signature(s) Signed: 05/23/2019 5:13:38 PM By: Carlene Coria RN Signed: 05/25/2019 12:52:55 PM By: Linton Ham MD Entered By: Linton Ham on 05/23/2019 09:27:27 -------------------------------------------------------------------------------- Multi-Disciplinary Care Plan Details Patient Name: Date of Service: Joel Bishop MS, [place]. 05/23/2019 8:00 A M Medical Record Number: [id number] Patient Account Number: [id number] Date of Birth/Sex: Treating RN: 12/11/1955 (63 y.o. Jerilynn Mages) Carlene Coria Primary Care Provider: [name], [name]  Other Clinician: Referring Provider: Treating Provider/Extender: Townsend Roger, [name] Weeks in Treatment: 0 Active Inactive Electronic Signature(s) Signed: 05/23/2019 5:13:38 PM By: Yevonne Pax RN Entered By: Yevonne Pax on 05/23/2019 09:16:42 -------------------------------------------------------------------------------- Pain Assessment Details Patient Name: Date of Service: Joel Blalock [place], [name] 05/23/2019 8:00 A M Medical Record Number: [id number] Patient Account Number: [id number] Date of Birth/Sex: Treating RN: [birth year] (64 y.o. Damaris Schooner Primary Care Provider: Marney Setting, [name] Other Clinician: Referring Provider: Treating Provider/Extender: Townsend Roger, [name] Weeks in Treatment: 0 Active Problems Location of Pain Severity and Description of Pain Patient Has Paino No Site Locations Rate the pain. Current Pain Level: 0 Pain Management and Medication Current Pain Management: Electronic Signature(s) Signed: 05/23/2019 5:27:28 PM By: Zenaida Deed RN, BSN Entered By: Zenaida Deed on 05/23/2019  08:37:04 -------------------------------------------------------------------------------- Patient/Caregiver Education Details Patient Name: Date of Service: Joel Blalock [place], [place] Wenda Low 5/18/2021andnbsp8:00 A M Medical Record Number: [id number] Patient Account Number: [id number] Date of Birth/Gender: Treating RN: 05-Jun-1955 (63 y.o. Judie Petit) Yevonne Pax Primary Care Physician: Marney Setting, [name] Other Clinician: Referring Physician: Treating Physician/Extender: Townsend Roger, [name] Weeks in Treatment: 0 Education Assessment Education Provided To: Patient Education Topics Provided Wound/Skin Impairment: Methods: Explain/Verbal Responses: State content correctly Electronic Signature(s) Signed: 05/23/2019 5:13:38 PM By: Yevonne Pax RN Entered By: Yevonne Pax on 05/23/2019 09:13:09 -------------------------------------------------------------------------------- Vitals Details Patient Name: Date of Service: Joel Blalock [place], [name] 05/23/2019 8:00 A M Medical Record Number: [id number] Patient Account Number: [id number] Date of Birth/Sex: Treating RN: 15-Jul-1955 (64 y.o. Damaris Schooner Primary Care Provider: Marney Setting, [name] Other Clinician: Referring Provider: Treating Provider/Extender: Townsend Roger, [name] Weeks in Treatment: 0 Vital Signs Time Taken: 08:20 Temperature (F): 98.2 Height (in): 72 Pulse (bpm): 89 Source: Stated Respiratory Rate (breaths/min): 18 Weight (lbs): 240 Blood Pressure (mmHg): 179/86 Source: Stated Capillary Blood Glucose (mg/dl): 893 Body Mass Index (BMI): 32.5 Reference Range: 80 - 120 mg / dl Notes glucose per pt report this am Electronic Signature(s) Signed: 05/23/2019 5:27:28 PM By: Zenaida Deed RN, BSN Entered By: Zenaida Deed on 05/23/2019 08:21:40

## 2019-05-25 NOTE — Progress Notes (Signed)
GIOVONI, BUNCH ([id number]) Visit Report for 05/23/2019 HPI Details Patient Name: [name] 05/23/2019 8:00 A M Medical Record Number: [id number] Patient Account Number: [id number] Date of Birth/Sex: Treating RN: 1955/02/03 (64 y.o. Judie Petit) Yevonne Pax Primary Care Provider: Marney Setting, [name] Other Clinician: Referring Provider: Treating Provider/Extender: Townsend Roger, [name] Weeks in Treatment: 0 History of Present Illness HPI Description: ADMISSION 12/22/2018 This is a 64 year old type II diabetic patient who was cared for in this clinic from 2012 through 2013 by Dr. Jimmey Ralph for a wound in the same location on the right medial toe. I do not have records from this time however the patient states that he was treated with an advanced treatment product and a prolonged course of total contact casting. He had an MRI of the foot but is unaware whether he had osteomyelitis at that time. He states that sometime toward the beginning of September he noticed a blister in this area with surrounding callus. He saw his primary doctor he was given a course of Augmentin and was put in a surgical shoe. At one point it was felt to be healing nicely. He followed with his primary doctor on 10/19 noted large areas of callus in the wound. Area. He has not been x-rayed or cultured. He has been using topical antibiotic ointment. Past medical history includes type 2 diabetes with polyneuropathy, whitecoat hypertension and hyperlipidemia ABI in our clinic was noncompressible on the right 12/24; culture I did last time showed MRSA. I started him on doxycycline 100 twice daily for 10 days on 12/21. His MRI is scheduled for January 1. I am still going to use silver alginate to this wound and we will see him back after the MRI in 10 days 01/12/2019 patient arrives with considerable amount of callus over the wound. his MRI is not done because of issues with the creatinine although  this was normal in October. He is using a Darco forefoot offloading 1/14; his MRI showed a small plantar ulceration at the medial base of the great toe with adjacent soft tissue enhancement consistent with cellulitis but no evidence of underlying osteomyelitis. This is obviously relieving for a wound that initially probe to bone. This is a recurrence in the same area. We have been using collagen 1/21; the patient is tolerating the antibiotic I gave him last week doxycycline. We have been using collagen. The wound is come down in size still with 3 mm of depth. He is using a forefoot off loader 1/28; the patient has completed antibiotics. We have been using silver collagen. The wound has come in quite a bit. 2/4; the patient arrived in clinic today with the silver collagen somewhat adherent to the wound. After the intake nurse remove the dressing there was some purulence which she cultured. 2/11; we are using silver alginate. Culture I did last time showed staph epidermidis abundant amount. Although staph epidermidis is not a usual clinical pathogen I did put him on trimethoprim sulfamethoxazole 1 p.o. twice daily for 7 days. He is tolerating this well. He comes in today with the wound looking a lot better. He did have a twisting fall and wondered whether he hurt his ankle. He certainly fractured forefoot off loader 2/25; the patient's wound is closed he has completed his antibiotics. Areas on the right medial toe. We talked about diabetic shoes with custom insert he is going to get his primary doctor to write this. We advised offloading with foam and gauze  daily 05/23/2019 READMISSION This is a patient with diabetic neuropathy. We had him in the clinic earlier this year with a difficult wound on the plantar right great toe. This eventually closed over. He has been padding this in his shoes. When he left the clinic he was supposed to look at diabetic foot wear but I do not think he ever did. He tells  [place] that 3 weeks ago he noticed a thick blister on the tip of his left second toe. He was using waders. He is a fishing business. This was large over the tip of the toe encompassing the nail. It is dried up and he has a callus thick area on the tip of this toe. Notable for the fact that his second toes are much longer and slightly hammered at the DIP then the surrounding toes on both sides. Electronic Signature(s) Signed: 05/25/2019 12:52:55 PM By: Baltazar Najjar MD Entered By: Baltazar Najjar on 05/23/2019 09:30:21 -------------------------------------------------------------------------------- Physical Exam Details Patient Name: Date of Service: Joel Blalock MS, [name] 05/23/2019 8:00 A M Medical Record Number: [id number] Patient Account Number: [id number] Date of Birth/Sex: Treating RN: 08-Oct-1955 (64 y.o. Judie Petit) Yevonne Pax Primary Care Provider: Marney Setting, [name] Other Clinician: Referring Provider: Treating Provider/Extender: Townsend Roger, [name] Weeks in Treatment: 0 Constitutional Patient is hypertensive.Marland Kitchen Respirations regular, non-labored and within target range.. Temperature is normal and within the target range for the patient.Marland Kitchen Appears in no distress. Cardiovascular Pedal pulses present bilaterally.. Notes Wound exam; fortunately the area on the right great toe looks fairly healthy. The problem is the left second toe. Dried callus encompassing the entire toenail which is probably mycotic. This is thick and hard there is no open wound here. It looks like this area was contracting. I considered taking some of this off however I was worried I could cure it more of a problem then he had currently has. It is notable for the fact that the toe itself is almost a centimeter longer than his great toe and slightly hammered Electronic Signature(s) Signed: 05/25/2019 12:52:55 PM By: Baltazar Najjar MD Entered By: Baltazar Najjar on 05/23/2019  09:37:46 -------------------------------------------------------------------------------- Physician Orders Details Patient Name: Date of Service: Joel Blalock MS, [name] 05/23/2019 8:00 A M Medical Record Number: [id number] Patient Account Number: [id number] Date of Birth/Sex: Treating RN: 08-05-1955 (63 y.o. Melonie [place] Primary Care Provider: Marney Setting, [name] Other Clinician: Referring Provider: Treating Provider/Extender: Townsend Roger, [name] Weeks in Treatment: 0 Verbal / Phone Orders: No Diagnosis Coding [facility]charge [facility] - patient to see podiatrist , patient to pad area daily Consults Dermatology - [facility] , diabetic ortho shoes/ inserts Electronic Signature(s) Signed: 05/23/2019 5:13:38 PM By: Yevonne Pax RN Signed: 05/25/2019 12:52:55 PM By: Baltazar Najjar MD Entered By: Yevonne Pax on 05/23/2019 09:19:56 Prescription 05/23/2019 -------------------------------------------------------------------------------- Royetta Car MD Patient Name: Provider: [birth year] [id number] Date of Birth: NPI#Judie Petit [id number] Sex: DEA #: [phone] [id number] Phone #: License #: Eligha Bridegroom [facility] Patient Address: [address] [place], [place] [address] [address] [place], [place] [address] [phone] Allergies Name Name Reaction Severity peanut anaphylaxis Severe Provider's Orders Dermatology - [facility] , diabetic ortho shoes/ inserts Hand Signature: Date(s): Electronic Signature(s) Signed: 05/23/2019 5:13:38 PM By: Yevonne Pax RN Signed: 05/25/2019 12:52:55 PM By: Baltazar Najjar MD Entered By: Yevonne Pax on 05/23/2019 09:19:57 -------------------------------------------------------------------------------- Problem List Details Patient Name: Date of  Service: Joel Blalock MS, [name] 05/23/2019 8:00 A M Medical Record Number:  [id number] Patient Account Number: [id number] Date of Birth/Sex: Treating RN: August 21, 1955 (63 y.o. Judie Petit) Yevonne Pax Primary Care Provider: Marney Setting, [name] Other Clinician: Referring Provider: Treating Provider/Extender: Townsend Roger, [name] Weeks in Treatment: 0 Active Problems ICD-10 Encounter Code Description Active Date MDM Diagnosis E11.42 Type 2 diabetes mellitus with diabetic polyneuropathy 05/23/2019 No Yes L97.528 Non-pressure chronic ulcer of other part of left foot with other specified 05/23/2019 No Yes severity Inactive Problems Resolved Problems Electronic Signature(s) Signed: 05/25/2019 12:52:55 PM By: Baltazar Najjar MD Entered By: Baltazar Najjar on 05/23/2019 09:25:45 -------------------------------------------------------------------------------- Progress Note Details Patient Name: Date of Service: Joel Blalock MS, [name] 05/23/2019 8:00 A M Medical Record Number: [id number] Patient Account Number: [id number] Date of Birth/Sex: Treating RN: 1955/07/14 (63 y.o. Judie Petit) Yevonne Pax Primary Care Provider: Marney Setting, [name] Other Clinician: Referring Provider: Treating Provider/Extender: Townsend Roger, [name] Weeks in Treatment: 0 Subjective History of Present Illness (HPI) ADMISSION 12/22/2018 This is a 64 year old type II diabetic patient who was cared for in this clinic from 2012 through 2013 by Dr. Jimmey Ralph for a wound in the same location on the right medial toe. I do not have records from this time however the patient states that he was treated with an advanced treatment product and a prolonged course of total contact casting. He had an MRI of the foot but is unaware whether he had osteomyelitis at that time. He states that sometime toward the beginning of September he noticed a blister in this area with surrounding callus. He saw his primary doctor he was given a course of Augmentin and was put in a surgical shoe. At one point it was felt to be  healing nicely. He followed with his primary doctor on 10/19 noted large areas of callus in the wound. Area. He has not been x-rayed or cultured. He has been using topical antibiotic ointment. Past medical history includes type 2 diabetes with polyneuropathy, whitecoat hypertension and hyperlipidemia ABI in our clinic was noncompressible on the right 12/24; culture I did last time showed MRSA. I started him on doxycycline 100 twice daily for 10 days on 12/21. His MRI is scheduled for January 1. I am still going to use silver alginate to this wound and we will see him back after the MRI in 10 days 01/12/2019 patient arrives with considerable amount of callus over the wound. his MRI is not done because of issues with the creatinine although this was normal in October. He is using a Darco forefoot offloading 1/14; his MRI showed a small plantar ulceration at the medial base of the great toe with adjacent soft tissue enhancement consistent with cellulitis but no evidence of underlying osteomyelitis. This is obviously relieving for a wound that initially probe to bone. This is a recurrence in the same area. We have been using collagen 1/21; the patient is tolerating the antibiotic I gave him last week doxycycline. We have been using collagen. The wound is come down in size still with 3 mm of depth. He is using a forefoot off loader 1/28; the patient has completed antibiotics. We have been using silver collagen. The wound has come in quite a bit. 2/4; the patient arrived in clinic today with the silver collagen somewhat adherent to the wound. After the intake nurse remove the dressing there was some purulence which she cultured. 2/11; we are using silver alginate. Culture I did last time showed staph  epidermidis abundant amount. Although staph epidermidis is not a usual clinical pathogen I did put him on trimethoprim sulfamethoxazole 1 p.o. twice daily for 7 days. He is tolerating this well. He comes in  today with the wound looking a lot better. He did have a twisting fall and wondered whether he hurt his ankle. He certainly fractured forefoot off loader 2/25; the patient's wound is closed he has completed his antibiotics. Areas on the right medial toe. We talked about diabetic shoes with custom insert he is going to get his primary doctor to write this. We advised offloading with foam and gauze daily 05/23/2019 READMISSION This is a patient with diabetic neuropathy. We had him in the clinic earlier this year with a difficult wound on the plantar right great toe. This eventually closed over. He has been padding this in his shoes. When he left the clinic he was supposed to look at diabetic foot wear but I do not think he ever did. He tells us that 3 weeks ago he noticed a thick blister on the tip of his left second toe. He was using waders. He is a fishing business. This was large over the tip of the toe encompassing the nail. It is dried up and he has a callus thick area on the tip of this toe. Notable for the fact that his second toes are much longer and slightly hammered at the DIP then the surrounding toes on both sides. Patient History Information obtained from Patient. Allergies peanut (Severity: Severe, Reaction: anaphylaxis) Family History Cancer - Maternal Grandparents, Diabetes - Father, Heart Disease - Father,Paternal Grandparents, No family history of Hereditary Spherocytosis, Hypertension, Kidney Disease, Lung Disease, Seizures, Stroke, Thyroid Problems, Tuberculosis. Social History Never smoker, Marital Status - Married, Alcohol Use - Rarely, Drug Use - No History, Caffeine Use - Daily. Medical History Cardiovascular Patient has history of Hypertension Endocrine Patient has history of Type II Diabetes Denies history of Type I Diabetes Genitourinary Denies history of End Stage Renal Disease Integumentary (Skin) Denies history of History of Burn Neurologic Patient has  history of Neuropathy Psychiatric Denies history of Anorexia/bulimia, Confinement Anxiety Patient is treated with Oral Agents. Blood sugar is not tested. Medical A Surgical History Notes nd Cardiovascular hyperlipidemia Integumentary (Skin) previous wound to right great toe Review of Systems (ROS) Constitutional Symptoms (General Health) Denies complaints or symptoms of Fatigue, Fever, Chills, Marked Weight Change. Eyes Denies complaints or symptoms of Dry Eyes, Vision Changes, Glasses / Contacts. Ear/Nose/Mouth/Throat Denies complaints or symptoms of Chronic sinus problems or rhinitis. Respiratory Denies complaints or symptoms of Chronic or frequent coughs, Shortness of Breath. Cardiovascular Denies complaints or symptoms of Chest pain. Gastrointestinal Denies complaints or symptoms of Frequent diarrhea, Nausea, Vomiting. Genitourinary Denies complaints or symptoms of Frequent urination. Integumentary (Skin) Complains or has symptoms of Wounds - left 2nd toe. Musculoskeletal Denies complaints or symptoms of Muscle Pain, Muscle Weakness. Neurologic Complains or has symptoms of Numbness/parasthesias. Psychiatric Denies complaints or symptoms of Claustrophobia, Suicidal. Objective Constitutional Patient is hypertensive.Marland Kitchen. Respirations regular, non-labored and within target range.. Temperature is normal and within the target range for the patient.Marland Kitchen. Appears in no distress. Vitals Time Taken: 8:20 AM, Height: 72 in, Source: Stated, Weight: 240 lbs, Source: Stated, BMI: 32.5, Temperature: 98.2 F, Pulse: 89 bpm, Respiratory Rate: 18 breaths/min, Blood Pressure: 179/86 mmHg, Capillary Blood Glucose: 150 mg/dl. General Notes: glucose per pt report this am Cardiovascular Pedal pulses present bilaterally.. General Notes: Wound exam; fortunately the area on the right great toe looks  fairly healthy. ooThe problem is the left second toe. Dried callus encompassing the entire toenail  which is probably mycotic. This is thick and hard there is no open wound here. It looks like this area was contracting. I considered taking some of this off however I was worried I could cure it more of a problem then he had currently has. It is notable for the fact that the toe itself is almost a centimeter longer than his great toe and slightly hammered Assessment Active Problems ICD-10 Type 2 diabetes mellitus with diabetic polyneuropathy Non-pressure chronic ulcer of other part of left foot with other specified severity Plan Discharge From [facility] Services: Discharge from [facility] - patient to see podiatrist , patient to pad area daily Consults ordered were: Dermatology - [facility] , diabetic ortho shoes/ inserts 1. I referred [facility] to [facility] for consideration of custom shoes/inserts and orthotics 2. I did not attempt to remove the thick callus nail on the left second toe. I will leave this for podiatry if they think this is worthwhile. 3. He is clearly going to need diabetic foot wear. Very active man. Wounds on his feet will become more problematic if we do not address this issue Electronic Signature(s) Signed: 05/25/2019 12:52:55 PM By: Linton Ham MD Entered By: Linton Ham on 05/23/2019 09:38:53 -------------------------------------------------------------------------------- HxROS Details Patient Name: Date of Service: [name], Cool. 05/23/2019 8:00 A M Medical Record Number: [id number] Patient Account Number: [id number] Date of Birth/Sex: Treating RN: [birth year] (64 y.o. [name] Primary Care Provider: [name], [name] Other Clinician: Referring Provider: Treating Provider/Extender: Leonides Schanz, [name] Weeks in Treatment: 0 Information Obtained From Patient Constitutional Symptoms (General Health) Complaints and Symptoms: Negative for: Fatigue; Fever; Chills; Marked Weight Change Eyes Complaints and  Symptoms: Negative for: Dry Eyes; Vision Changes; Glasses / Contacts Ear/Nose/Mouth/Throat Complaints and Symptoms: Negative for: Chronic sinus problems or rhinitis Respiratory Complaints and Symptoms: Negative for: Chronic or frequent coughs; Shortness of Breath Cardiovascular Complaints and Symptoms: Negative for: Chest pain Medical History: Positive for: Hypertension Past Medical History Notes: hyperlipidemia Gastrointestinal Complaints and Symptoms: Negative for: Frequent diarrhea; Nausea; Vomiting Genitourinary Complaints and Symptoms: Negative for: Frequent urination Medical History: Negative for: End Stage Renal Disease Integumentary (Skin) Complaints and Symptoms: Positive for: Wounds - left 2nd toe Medical History: Negative for: History of Burn Past Medical History Notes: previous wound to right great toe Musculoskeletal Complaints and Symptoms: Negative for: Muscle Pain; Muscle Weakness Neurologic Complaints and Symptoms: Positive for: Numbness/parasthesias Medical History: Positive for: Neuropathy Psychiatric Complaints and Symptoms: Negative for: Claustrophobia; Suicidal Medical History: Negative for: Anorexia/bulimia; Confinement Anxiety Hematologic/Lymphatic Endocrine Medical History: Positive for: Type II Diabetes Negative for: Type I Diabetes Time with diabetes: 8 years Treated with: Oral agents Blood sugar tested every day: No Immunological Oncologic Immunizations Pneumococcal Vaccine: Received Pneumococcal Vaccination: No Implantable Devices None Family and Social History Cancer: Yes - Maternal Grandparents; Diabetes: Yes - Father; Heart Disease: Yes - Father,Paternal Grandparents; Hereditary Spherocytosis: No; Hypertension: No; Kidney Disease: No; Lung Disease: No; Seizures: No; Stroke: No; Thyroid Problems: No; Tuberculosis: No; Never smoker; Marital Status - Married; Alcohol Use: Rarely; Drug Use: No History; Caffeine Use: Daily;  Financial Concerns: No; Food, Clothing or Shelter Needs: No; Support System Lacking: No; Transportation Concerns: No Engineer, maintenance) Signed: 05/23/2019 5:27:28 PM By: Baruch Gouty RN, BSN Signed: 05/25/2019 12:52:55 PM By: Linton Ham MD Entered By: Baruch Gouty on 05/23/2019 08:26:07 -------------------------------------------------------------------------------- SuperBill Details Patient Name:  Date of Service: Dalene Carrow 05/23/2019 Medical Record Number: [id number] Patient Account Number: [id number] Date of Birth/Sex: Treating RN: 1955/09/23 (63 y.o. Judie Petit) Yevonne Pax Primary Care Provider: Marney Setting, [name] Other Clinician: Referring Provider: Treating Provider/Extender: Townsend Roger, [name] Weeks in Treatment: 0 Diagnosis Coding ICD-10 Codes Code Description E11.42 Type 2 diabetes mellitus with diabetic polyneuropathy L97.528 Non-pressure chronic ulcer of other part of left foot with other specified severity Facility Procedures CPT4 Code: [id number] Description: 99213 - WOUND CARE VISIT-LEV 3 EST PT Modifier: Quantity: 1 Physician Procedures : CPT4 Code Description Modifier [id number] 99213 - WC PHYS LEVEL 3 - EST PT ICD-10 Diagnosis Description E11.42 Type 2 diabetes mellitus with diabetic polyneuropathy L97.528 Non-pressure chronic ulcer of other part of left foot with other specified  severity Quantity: 1 Electronic Signature(s) Signed: 05/23/2019 5:13:38 PM By: Yevonne Pax RN Signed: 05/25/2019 12:52:55 PM By: Baltazar Najjar MD Entered By: Yevonne Pax on 05/23/2019 13:02:28

## 2019-06-04 ENCOUNTER — Other Ambulatory Visit: Payer: Self-pay | Admitting: Family Medicine

## 2019-06-07 ENCOUNTER — Other Ambulatory Visit: Payer: Self-pay | Admitting: Family Medicine

## 2019-06-08 ENCOUNTER — Other Ambulatory Visit: Payer: Self-pay | Admitting: Family Medicine

## 2019-06-12 ENCOUNTER — Telehealth: Payer: Self-pay | Admitting: Family Medicine

## 2019-06-12 ENCOUNTER — Telehealth: Payer: Self-pay

## 2019-06-12 NOTE — Telephone Encounter (Signed)
Looks like he was dismissed from [facility] - will not be able to accept transfer.  Katina Degree. Jimmey Ralph, MD 06/12/2019 3:52 PM

## 2019-06-12 NOTE — Telephone Encounter (Signed)
Pam is calling in on behalf of the patient, asking to do a TOC from Dr.McGowen to Dr.Parker as he was told from his pcp today that he would need to find another provider, and they felt as if they were taken serious.

## 2019-06-12 NOTE — Telephone Encounter (Signed)
Yes, I told patient he would need to find a new MD today and I have started the process of dismissing him from [facility]. Signed:  Santiago Bumpers, MD           06/12/2019

## 2019-06-12 NOTE — Telephone Encounter (Signed)
Patient walked in a little after I spoke with wife on the phone, and she was correct about patient was frustrated and heated. He said he was mad and he wanted to speak with Dr. Milinda Cave right now. I told he was with patients, he said he would wait. I asked if there was anything I could help him with. He was talking about not being able to get his prescription because his PCP Dr. Milinda Cave would not approve.  I asked him what was the prescription. He said it was Lisinopril.   His voice was loud and he was cursing in the lobby with patients waiting to see their providers. He said he was a personal friend of Dr. Samul Dada as well as a patient. I told him to have a seat I would go get Dr. Milinda Cave to come speak with him if possible. He did not have his mask on, I asked him to please put his mask on while he is visiting at our facility.  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

## 2019-06-12 NOTE — Telephone Encounter (Signed)
I was in an exam room with a patient and my nurse and Annabelle Harman told me that a patient of mine was in our lobby and I needed to talk to him.   Annabelle Harman was visibly upset and told me he was rude, demanding, uncooperative, and was cursing. There were other patients waiting in the office lobby. I immediately left the exam room and went to the lobby to speak to Elsah. I told him that his behavior and language was inexcusable and would not be tolerated. I told him he needs to find a new doctor. I want him dismissed from this practice ASAP. Thank you. -Phil.

## 2019-06-12 NOTE — Telephone Encounter (Signed)
30 day supply sent in, nothing further needed. Message below was done before medication was sent.

## 2019-06-12 NOTE — Telephone Encounter (Signed)
Please see note below. 

## 2019-06-12 NOTE — Telephone Encounter (Signed)
Wife calling (DPR) about husband prescription(s)   lisinopril (ZESTRIL) 20 MG tablet [[id number]]   Stating that Dr. Milinda Cave denied filling this prescription refill and he is out.  Then she began starting talking about other prescriptions that were not approved in the past. I looked on the appt desk and reviewed the last time he had been in the office with Dr. Milinda Cave (04/11/19) And then she brought up the time where I said he couldn't be seen because he was 12 minutes late ( 03/22/19) I told her I didn't make that call, Dr. Milinda Cave was notified when he arrived, he told front desk to reschedule patient to another date.  Wife stated that patient was very frustrated and heated. She is requesting a call back as soon as possible [phone].

## 2019-06-12 NOTE — Telephone Encounter (Signed)
Okay per Dr.McGowen to do 30 day supply with no refills until he finds another PCP.

## 2019-06-12 NOTE — Telephone Encounter (Signed)
Caller : Pam  Call Back # [phone]   Called in reference to Dr Milinda Cave, patient 's wife states that husband feels like pcp is not taking patient serious in regards to medication condition. Patient's wife would like to speak to Administrated about possibly transferring care.  PCP McGowen would not see patient and he needs to find a new PCP.   Please Advise

## 2019-06-13 ENCOUNTER — Other Ambulatory Visit: Payer: Self-pay

## 2019-06-13 DIAGNOSIS — Z91018 Allergy to other foods: Secondary | ICD-10-CM

## 2019-06-13 MED ORDER — ATORVASTATIN CALCIUM 40 MG PO TABS: 40.0000 mg | ORAL_TABLET | Freq: Every day | ORAL | 0 refills | Status: AC

## 2019-06-13 MED ORDER — EPINEPHRINE 0.3 MG/0.3ML IJ SOAJ: 0.3000 mg | Freq: Once | INTRAMUSCULAR | 0 refills | Status: AC

## 2019-06-13 MED ORDER — PIOGLITAZONE HCL 45 MG PO TABS: 45.0000 mg | ORAL_TABLET | Freq: Every day | ORAL | 0 refills | Status: AC

## 2019-06-13 NOTE — Telephone Encounter (Signed)
Some medications have refills until July 8, refills completed for needed medications.

## 2019-06-13 NOTE — Telephone Encounter (Signed)
Called- documented on original encounter.

## 2019-06-13 NOTE — Telephone Encounter (Signed)
Spoke with patients wife at length about patient's behavior in the lobby and towards the physician yesterday. She stated multiple times that we have "all new staff" and I explained to her that our current staff has been in place for greater than a year. She stated that her husband had not missed a recent  appointment. I explained  to her that when he left his April 6 appointment he stopped at check out and made a May 4th follow up - as directed by the provider. When he did not come to that appointment we were unable to refill his lisinopril. She stated he was frustrated by not being seen on his March 17 appointment because he was "a few minutes late". She said that her husband can be loud and rambunctious at times but that's just because "he's from [place]" and we should understand [place]. I explained to her that based on the feedback I received from staff and the provider that his behavior was aggressive and we don't allow patients to treat any staff in that manner. She asked that I make sure he have refills on his medication he currently receives from Dr. Milinda Cave while they attempt to locate another primary care provider. Wife expressed that while Mr. Austad really liked Dr. Milinda Cave he did not like the staff. She asked for me to call Mr. Alsop and speak to him as well.  I contacted the patient and spoke with him about his behavior. He was insistent that he did not cuss at staff and that he was the only one in the lobby and that there were no other patients present. He said that someone called him before his May 4 appointment and told him they would see if he even needed to keep the appointment and would call him back. When he never heard from anyone he assumed he did not need the follow up. He was frustrated because he says that when his medication was denied at [facility] we told the [place] employees that he "failed to keep his appointments." He also said that he had a hard time with staff at this  location and he always felt like they were making mistakes and were rude. He said at a recent visit that one person was drawing blood out of one arm while another took his blood pressure and another arm and he did not agree with his practice. Ultimately, I listened to patient's concerns and advised that the dismissal would still stand and that we would get his prescriptions called in to the [facility] in [place].

## 2019-06-14 ENCOUNTER — Other Ambulatory Visit: Payer: Self-pay | Admitting: Family Medicine

## 2019-06-21 ENCOUNTER — Other Ambulatory Visit: Payer: Self-pay

## 2019-06-21 ENCOUNTER — Ambulatory Visit: Payer: BC Managed Care – PPO | Admitting: Podiatry

## 2019-06-21 DIAGNOSIS — L98499 Non-pressure chronic ulcer of skin of other sites with unspecified severity: Secondary | ICD-10-CM

## 2019-06-21 DIAGNOSIS — L97522 Non-pressure chronic ulcer of other part of left foot with fat layer exposed: Secondary | ICD-10-CM

## 2019-06-21 DIAGNOSIS — L603 Nail dystrophy: Secondary | ICD-10-CM

## 2019-06-21 DIAGNOSIS — E118 Type 2 diabetes mellitus with unspecified complications: Secondary | ICD-10-CM | POA: Diagnosis not present

## 2019-06-23 ENCOUNTER — Encounter: Payer: Self-pay | Admitting: Podiatry

## 2019-06-23 NOTE — Progress Notes (Signed)
Subjective:  Patient ID: Joel Kinds., male    DOB: Jun 21, 1955,  MRN: [id number]  Chief Complaint  Patient presents with  . Diabetes    pt is here for diabetic foot care, pt is also a diabetic type 2, pt also states that he is looking to get diabetic shoes as well.    64 y.o. male presents for wound care.  Patient presents with diabetic foot foot ulcer of the left second digit as well as right hallux IPJ preulcerative lesion.  Patient states that the left second toe got progressively worse.  His sugars have been little bit out of control.  He is a type II diabetic with last A1c of 8.3.  Patient states he has been soaking the feet.  He has been applying some Neosporin and Band-Aid.  He denies any other acute complaints.  He denies seeing anyone else prior to seeing me.  He would like to know what his treatment options are and he would would like to prevent an amputation if possible.   Review of Systems: Negative except as noted in the HPI. Denies N/V/F/Ch.  Past Medical History:  Diagnosis Date  . Colon cancer screening    03/2014 iFOB negative.  . Diabetes mellitus with complication (HCC) 09/12/10   Has mild DPN.  HbA1c 12.6% at the time of dx.  . Elevated blood pressure reading without diagnosis of hypertension 12/26/2010   White coat HTN  . 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  . Hyperlipidemia   . Increased prostate specific antigen (PSA) velocity 2016   ? due to cycling?  Pt chose repeat PSA instead of Biopsy and it showed lower PSA value when he had not been cycling prior to PSA check.  . Obesity, Class I, BMI 30-34.9   . Toe ulcer due to DM (HCC) fall 2012   Right great toe    Current Outpatient Medications:  .  atorvastatin (LIPITOR) 40 MG tablet, Take 1 tablet (40 mg total) by mouth daily., Disp: 30 tablet, Rfl: 0 .  B-D ULTRAFINE III SHORT PEN 31G X 8 MM MISC, daily. use as directed, Disp: , Rfl:    .  Cyanocobalamin (VITAMIN B 12 PO), Take by mouth., Disp: , Rfl:  .  EPINEPHrine 0.3 mg/0.3 mL IJ SOAJ injection, INJECT 0.3 MLS INTO THE MUSCLE ONCE FOR 1 DOSE, Disp: , Rfl:  .  FARXIGA 10 MG TABS tablet, Take 10 mg by mouth daily., Disp: , Rfl:  .  glucose blood (FREESTYLE LITE) test strip, Use to check blood sugar twice daily as directed, Disp: 100 each, Rfl: 5 .  Insulin Pen Needle (B-D ULTRAFINE III SHORT PEN) 31G X 8 MM MISC, USE AS DIRECTED ONCE DAILY, Disp: 100 each, Rfl: 0 .  Lancet Devices (B-D LANCET DEVICE) MISC, Use as directed to check blood sugars 2 times per day, Disp: 60 each, Rfl: 5 .  liraglutide (VICTOZA) 18 MG/3ML SOPN, INJECT 1.8MG  SUBCUTANEOUSLY ONCE DAILY, Disp: 18 mL, Rfl: 2 .  lisinopril (ZESTRIL) 20 MG tablet, Take 1 tablet by mouth once daily, Disp: 30 tablet, Rfl: 0 .  metFORMIN (GLUCOPHAGE) 500 MG tablet, TAKE 2 TABLETS BY MOUTH TWICE DAILY WITH MEALS, Disp: 360 tablet, Rfl: 0 .  Multiple Vitamins-Minerals (CENTRUM PO), Take 1 tablet by mouth daily.  , Disp: , Rfl:  .  pioglitazone (ACTOS) 45 MG tablet, Take 1 tablet (45 mg total) by mouth  daily., Disp: 30 tablet, Rfl: 0 .  Probiotic Product (PROBIOTIC-10 PO), Take 1 capsule by mouth daily., Disp: , Rfl:   Social History   Tobacco Use  Smoking Status Never Smoker  Smokeless Tobacco Former Systems developer  . Types: Snuff  Tobacco Comment   Grizzly    Allergies  Allergen Reactions  . Other     Peanuts - choking    Objective:  There were no vitals filed for this visit. There is no height or weight on file to calculate BMI. Constitutional Well developed. Well nourished.  Vascular Dorsalis pedis pulses palpable bilaterally. Posterior tibial pulses palpable bilaterally. Capillary refill normal to all digits.  No cyanosis or clubbing noted. Pedal hair growth normal.  Neurologic Normal speech. Oriented to person, place, and time. Protective sensation absent  Dermatologic Wound Location: Left second digit distal  tip ulceration.  With fat layer exposed.  Does not probe down to bone.  No cellulitis noted.  No bone exposure noted. Wound Base: Mixed Granular/Fibrotic Peri-wound: Calloused Exudate: Scant/small amount Serous exudate Wound Measurements: -See below  Left second toe nail is dystrophic thickened and is partially avulsed.  Concern for underlying wound as well.   Right hallux IPJ preulcerative lesion.  No underlying wounds noted upon debridement.  No pinpoint bleeding noted.   Orthopedic: No pain to palpation either foot.   Radiographs: None Assessment:   1. Nail dystrophy   2. Toe ulcer, left, with fat layer exposed ([place])   3. Diabetes mellitus with complication ([place])   4. Superficial ulcerative lesion ([place])    Plan:  Patient was evaluated and treated and all questions answered.  Ulcer left distal tip ulceration -Debridement as below. -Dressed with Betadine wet-to-dry, DSD. -Continue off-loading with surgical shoe. -Patient is a high risk of losing the digit given that patient's sugars are well controlled.  I discussed with the patient.  Patient states understanding regarding the severity of the infection. -  Nail contusion/dystrophy second, left -Patient elects to proceed with minor surgery to remove entire toenail today. Consent reviewed and signed by patient. -Entire/total nail excised.  See procedure note below  Procedure: Excision of entire/total nail  Location: Left 2nd toe digit Anesthesia: Lidocaine 1% plain; 1.5 mL and Marcaine 0.5% plain; 1.5 mL, digital block. Skin Prep: Betadine. Dressing: Silvadene; telfa; dry, sterile, compression dressing. Technique: Following skin prep, the toe was exsanguinated and a tourniquet was secured at the base of the toe. The affected nail border was freed and excised. The tourniquet was then removed and sterile dressing applied. Disposition: Patient tolerated procedure well. Patient to return in 2 weeks for follow-up.   No  follow-ups on file.   Right hallux IPJ preulcerative lesion -Surgical shoe was dispensed to offload the lesion and therefore prevent an ulceration when ambulating.  There was no underlying wound present upon debridement.  However I educated the patient that given his uncontrolled sugars without proper offloading he can easily develop into an ulceration.  Patient states understanding  Procedure: Excisional Debridement of Wound Tool: Sharp chisel blade/tissue nipper Rationale: Removal of non-viable soft tissue from the wound to promote healing.  Anesthesia: none Pre-Debridement Wound Measurements: 0.5 cm x 0.5 cm x 0.3 cm  Post-Debridement Wound Measurements: 0.6 cm x 0.6 cm x 0.3 cm  Type of Debridement: Sharp Excisional Tissue Removed: Non-viable soft tissue Blood loss: Minimal (<50cc) Depth of Debridement: subcutaneous tissue. Technique: Sharp excisional debridement to bleeding, viable wound base.  Wound Progress: This is my initial encounter.  I will  continue to monitor the progression of the wound. Site healing conversation 7 Dressing: Dry, sterile, compression dressing. Disposition: Patient tolerated procedure well. Patient to return in 1 week for follow-up.  No follow-ups on file.

## 2019-06-29 ENCOUNTER — Ambulatory Visit (INDEPENDENT_AMBULATORY_CARE_PROVIDER_SITE_OTHER): Payer: BC Managed Care – PPO | Admitting: Podiatry

## 2019-06-29 ENCOUNTER — Other Ambulatory Visit: Payer: Self-pay

## 2019-06-29 DIAGNOSIS — L97522 Non-pressure chronic ulcer of other part of left foot with fat layer exposed: Secondary | ICD-10-CM

## 2019-06-29 DIAGNOSIS — Q666 Other congenital valgus deformities of feet: Secondary | ICD-10-CM

## 2019-06-29 DIAGNOSIS — M2042 Other hammer toe(s) (acquired), left foot: Secondary | ICD-10-CM

## 2019-06-29 DIAGNOSIS — E118 Type 2 diabetes mellitus with unspecified complications: Secondary | ICD-10-CM | POA: Diagnosis not present

## 2019-06-30 ENCOUNTER — Encounter: Payer: Self-pay | Admitting: Podiatry

## 2019-06-30 NOTE — Progress Notes (Signed)
Subjective:  Patient ID: Joel Kinds., male    DOB: 1955-07-24,  MRN: [id number]  Chief Complaint  Patient presents with  . Nail Problem    pt is here for a 1 week toe check, pt states that he is doing a lot better since the last time he was here.    64 y.o. male presents for wound care.  Patient presents with diabetic foot foot ulcer of the left second digit as well as right hallux IPJ preulcerative lesion.  Patient states that the left second toe got progressively worse.  His sugars have been little bit out of control.  He is a type II diabetic with last A1c of 8.3.  Patient states he has been soaking the feet.  He has been applying some Neosporin and Band-Aid.  He denies any other acute complaints.  He denies seeing anyone else prior to seeing me.  He would like to know what his treatment options are and he would would like to prevent an amputation if possible.   Review of Systems: Negative except as noted in the HPI. Denies N/V/F/Ch.  Past Medical History:  Diagnosis Date  . Colon cancer screening    03/2014 iFOB negative.  . Diabetes mellitus with complication (HCC) 09/12/10   Has mild DPN.  HbA1c 12.6% at the time of dx.  . Elevated blood pressure reading without diagnosis of hypertension 12/26/2010   White coat HTN  . 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  . Hyperlipidemia   . Increased prostate specific antigen (PSA) velocity 2016   ? due to cycling?  Pt chose repeat PSA instead of Biopsy and it showed lower PSA value when he had not been cycling prior to PSA check.  . Obesity, Class I, BMI 30-34.9   . Toe ulcer due to DM (HCC) fall 2012   Right great toe    Current Outpatient Medications:  .  atorvastatin (LIPITOR) 40 MG tablet, Take 1 tablet (40 mg total) by mouth daily., Disp: 30 tablet, Rfl: 0 .  B-D ULTRAFINE III SHORT PEN 31G X 8 MM MISC, daily. use as directed, Disp: , Rfl:  .  Cyanocobalamin  (VITAMIN B 12 PO), Take by mouth., Disp: , Rfl:  .  EPINEPHrine 0.3 mg/0.3 mL IJ SOAJ injection, INJECT 0.3 MLS INTO THE MUSCLE ONCE FOR 1 DOSE, Disp: , Rfl:  .  FARXIGA 10 MG TABS tablet, Take 10 mg by mouth daily., Disp: , Rfl:  .  glucose blood (FREESTYLE LITE) test strip, Use to check blood sugar twice daily as directed, Disp: 100 each, Rfl: 5 .  Insulin Pen Needle (B-D ULTRAFINE III SHORT PEN) 31G X 8 MM MISC, USE AS DIRECTED ONCE DAILY, Disp: 100 each, Rfl: 0 .  Lancet Devices (B-D LANCET DEVICE) MISC, Use as directed to check blood sugars 2 times per day, Disp: 60 each, Rfl: 5 .  liraglutide (VICTOZA) 18 MG/3ML SOPN, INJECT 1.8MG  SUBCUTANEOUSLY ONCE DAILY, Disp: 18 mL, Rfl: 2 .  lisinopril (ZESTRIL) 20 MG tablet, Take 1 tablet by mouth once daily, Disp: 30 tablet, Rfl: 0 .  metFORMIN (GLUCOPHAGE) 500 MG tablet, TAKE 2 TABLETS BY MOUTH TWICE DAILY WITH MEALS, Disp: 360 tablet, Rfl: 0 .  Multiple Vitamins-Minerals (CENTRUM PO), Take 1 tablet by mouth daily.  , Disp: , Rfl:  .  pioglitazone (ACTOS) 45 MG tablet, Take 1 tablet (45 mg total) by mouth daily., Disp:  30 tablet, Rfl: 0 .  Probiotic Product (PROBIOTIC-10 PO), Take 1 capsule by mouth daily., Disp: , Rfl:   Social History   Tobacco Use  Smoking Status Never Smoker  Smokeless Tobacco Former Neurosurgeon  . Types: Snuff  Tobacco Comment   Grizzly    Allergies  Allergen Reactions  . Other     Peanuts - choking    Objective:  There were no vitals filed for this visit. There is no height or weight on file to calculate BMI. Constitutional Well developed. Well nourished.  Vascular Dorsalis pedis pulses palpable bilaterally. Posterior tibial pulses palpable bilaterally. Capillary refill normal to all digits.  No cyanosis or clubbing noted. Pedal hair growth normal.  Neurologic Normal speech. Oriented to person, place, and time. Protective sensation absent  Dermatologic Wound Location: Left second digit distal tip ulceration.   With fat layer exposed.  Does not probe down to bone.  No cellulitis noted.  No bone exposure noted. Wound Base: Mixed Granular/Fibrotic Peri-wound: Calloused Exudate: Scant/small amount Serous exudate Wound Measurements: -See below  Left second toe nail is dystrophic thickened and is partially avulsed.  Concern for underlying wound as well.   Right hallux IPJ preulcerative lesion.  No underlying wounds noted upon debridement.  No pinpoint bleeding noted.  Hammertoe contracture with dorsiflexion of the PIPJ and plantarflexion of the DIPJ noted of the left second digit.   Orthopedic: No pain to palpation either foot.   Radiographs: None Assessment:   1. Diabetes mellitus with complication (HCC)   2. Toe ulcer, left, with fat layer exposed (HCC)   3. Hammertoe of second toe of left foot    Plan:  Patient was evaluated and treated and all questions answered.  Ulcer left distal tip ulceration -Debridement as below. -Dressed with Betadine wet-to-dry, DSD. -Continue off-loading with surgical shoe. -Patient is a high risk of losing the digit given that patient's sugars are well controlled.  I discussed with the patient.  Patient states understanding regarding the severity of the infection.   Hammertoe contracture left second digit secondary to pes planovalgus -I explained patient the etiology of hammertoe contracture and various treatment options were discussed.  I believe patient will benefit from diabetic shoes given the contractures of the hammertoes and with history of ulceration.  Patient agrees with the plan would like to proceed with diabetic shoes versus orthotics.  Patient will be scheduled to see [facility] for orthotics. --Also discussed with the patient that given the contracture of the digit he may benefit from flexor tenotomy during next clinic visit.  Once the ulcer is healed I will plan on performing a flexor tenotom  Nail contusion/dystrophy second, left -Healing well the  wound has decreased.  Right hallux IPJ preulcerative lesion -Surgical shoe was dispensed to offload the lesion and therefore prevent an ulceration when ambulating.  There was no underlying wound present upon debridement.  However I educated the patient that given his uncontrolled sugars without proper offloading he can easily develop into an ulceration.  Patient states understanding -Continue wearing surgical shoe.  Procedure: Excisional Debridement of Wound Tool: Sharp chisel blade/tissue nipper Rationale: Removal of non-viable soft tissue from the wound to promote healing.  Anesthesia: none Pre-Debridement Wound Measurements: 0.4 cm x 0.4 cm x 0.3 cm Post-Debridement Wound Measurements: 0.4 cm x 0.5 cm x 0.3 cm Type of Debridement: Sharp Excisional Tissue Removed: Non-viable soft tissue Blood loss: Minimal (<50cc) Depth of Debridement: subcutaneous tissue. Technique: Sharp excisional debridement to bleeding, viable wound base.  Wound Progress: The wound is decreasing in size and has considerably improved. Dressing: Dry, sterile, compression dressing. Disposition: Patient tolerated procedure well. Patient to return in 1 week for follow-up.  Return for See Liliane Channel for Diabetic shoes ASAP.

## 2019-07-14 ENCOUNTER — Other Ambulatory Visit: Payer: Self-pay

## 2019-07-14 ENCOUNTER — Ambulatory Visit: Payer: BC Managed Care – PPO | Admitting: Podiatry

## 2019-07-14 DIAGNOSIS — L97522 Non-pressure chronic ulcer of other part of left foot with fat layer exposed: Secondary | ICD-10-CM | POA: Diagnosis not present

## 2019-07-14 DIAGNOSIS — M205X2 Other deformities of toe(s) (acquired), left foot: Secondary | ICD-10-CM | POA: Diagnosis not present

## 2019-07-14 DIAGNOSIS — E118 Type 2 diabetes mellitus with unspecified complications: Secondary | ICD-10-CM

## 2019-07-16 ENCOUNTER — Encounter: Payer: Self-pay | Admitting: Podiatry

## 2019-07-16 NOTE — Progress Notes (Signed)
Subjective:  Patient ID: Joel Kinds., male    DOB: 1955/07/18,  MRN: [id number]  Chief Complaint  Patient presents with  . Diabetic Ulcer    Pt states healing well. Denies fever/chills/nausea/vomiting. No new concerns.    64 y.o. male presents for wound care.  Patient presents with a diabetic foot ulcer of the left second digit as well as right hallux IPJ preulcerative lesion.  Patient states both of them has healed really well he has been doing dressing changes.  He denies any other acute complaints.  He has been applying Neosporin and a Band-Aid.  He would like to discuss performing tenotomy of the second digit to prevent reulceration.   Review of Systems: Negative except as noted in the HPI. Denies N/V/F/Ch.  Past Medical History:  Diagnosis Date  . Colon cancer screening    03/2014 iFOB negative.  . Diabetes mellitus with complication (HCC) 09/12/10   Has mild DPN.  HbA1c 12.6% at the time of dx.  . Elevated blood pressure reading without diagnosis of hypertension 12/26/2010   White coat HTN  . 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  . Hyperlipidemia   . Increased prostate specific antigen (PSA) velocity 2016   ? due to cycling?  Pt chose repeat PSA instead of Biopsy and it showed lower PSA value when he had not been cycling prior to PSA check.  . Obesity, Class I, BMI 30-34.9   . Toe ulcer due to DM (HCC) fall 2012   Right great toe    Current Outpatient Medications:  .  atorvastatin (LIPITOR) 40 MG tablet, Take 1 tablet (40 mg total) by mouth daily., Disp: 30 tablet, Rfl: 0 .  B-D ULTRAFINE III SHORT PEN 31G X 8 MM MISC, daily. use as directed, Disp: , Rfl:  .  Cyanocobalamin (VITAMIN B 12 PO), Take by mouth., Disp: , Rfl:  .  EPINEPHrine 0.3 mg/0.3 mL IJ SOAJ injection, INJECT 0.3 MLS INTO THE MUSCLE ONCE FOR 1 DOSE, Disp: , Rfl:  .  FARXIGA 10 MG TABS tablet, Take 10 mg by mouth daily., Disp: ,  Rfl:  .  glucose blood (FREESTYLE LITE) test strip, Use to check blood sugar twice daily as directed, Disp: 100 each, Rfl: 5 .  Insulin Pen Needle (B-D ULTRAFINE III SHORT PEN) 31G X 8 MM MISC, USE AS DIRECTED ONCE DAILY, Disp: 100 each, Rfl: 0 .  Lancet Devices (B-D LANCET DEVICE) MISC, Use as directed to check blood sugars 2 times per day, Disp: 60 each, Rfl: 5 .  liraglutide (VICTOZA) 18 MG/3ML SOPN, INJECT 1.8MG  SUBCUTANEOUSLY ONCE DAILY, Disp: 18 mL, Rfl: 2 .  lisinopril (ZESTRIL) 20 MG tablet, Take 1 tablet by mouth once daily, Disp: 30 tablet, Rfl: 0 .  metFORMIN (GLUCOPHAGE) 500 MG tablet, TAKE 2 TABLETS BY MOUTH TWICE DAILY WITH MEALS, Disp: 360 tablet, Rfl: 0 .  Multiple Vitamins-Minerals (CENTRUM PO), Take 1 tablet by mouth daily.  , Disp: , Rfl:  .  pioglitazone (ACTOS) 45 MG tablet, Take 1 tablet (45 mg total) by mouth daily., Disp: 30 tablet, Rfl: 0 .  Probiotic Product (PROBIOTIC-10 PO), Take 1 capsule by mouth daily., Disp: , Rfl:   Social History   Tobacco Use  Smoking Status Never Smoker  Smokeless Tobacco Former Neurosurgeon  . Types: Snuff  Tobacco Comment   Grizzly    Allergies  Allergen Reactions  . Other  Peanuts - choking    Objective:  There were no vitals filed for this visit. There is no height or weight on file to calculate BMI. Constitutional Well developed. Well nourished.  Vascular Dorsalis pedis pulses palpable bilaterally. Posterior tibial pulses palpable bilaterally. Capillary refill normal to all digits.  No cyanosis or clubbing noted. Pedal hair growth normal.  Neurologic Normal speech. Oriented to person, place, and time. Protective sensation absent  Dermatologic Wound Location: Left second digit distal tip ulceration.  With fat layer exposed.  Does not probe down to bone.  No cellulitis noted.  No bone exposure noted. Wound Base: Mixed Granular/Fibrotic Peri-wound: Calloused Exudate: Scant/small amount Serous exudate Wound  Measurements: -See below  Left second toe nail is dystrophic thickened and is partially avulsed.  Concern for underlying wound as well.   Right hallux IPJ preulcerative lesion.  No underlying wounds noted upon debridement.  No pinpoint bleeding noted.  Hammertoe contracture with dorsiflexion of the PIPJ and plantarflexion of the DIPJ noted of the left second digit. Pre-ulcerative callus at the tip of the left, 2nd toe  Semi-reducible hammertoe deformity second toe left      Orthopedic: No pain to palpation either foot.   Radiographs: None Assessment:   1. Diabetes mellitus with complication (HCC)   2. Toe ulcer, left, with fat layer exposed (HCC)   3. Toe contracture, left    Plan:  Patient was evaluated and treated and all questions answered.  Ulcer left distal tip ulceration -Completely and clinically resolved.  No underlying ulceration noted with debridement.   Hammertoe contracture left second digit secondary to pes planovalgus -Flexor tenotomy was performed as described below.  Nail contusion/dystrophy second, left -Healing well the wound has decreased.  Right hallux IPJ preulcerative lesion -Completely resolved.  Hammertoe left second toe with pre-ulcerative callus -Flexor tenotomy as below. -Advised to remove the dressing in 24 hours and apply a band-aid and triple abx ointment every day thereafter.  Procedure: Flexor Tenotomy Indication for Procedure: toe with semi-reducible hammertoe with distal tip ulceration. Flexor tenotomy indicated to alleviate contracture, reduce pressure, and enhance healing of the ulceration. Location: left, 2nd toe Anesthesia: Lidocaine 1% plain; 1.5 mL and Marcaine 0.5% plain; 1.5 mL digital block Instrumentation: #11 blade Technique: The toe was anesthetized as above and prepped in the usual fashion. The toe was exsanquinated and a tourniquet was secured at the base of the toe. An #11 blade was then used to percutaneously release the  flexor tendon at the plantar surface of the toe with noted release of the hammertoe deformity.  Incision was closed with 2-0 Prolene.  The incision was then dressed with antibiotic ointment and band-aid. Compression splint dressing applied. Patient tolerated the procedure well. Dressing: Dry, sterile, compression dressing. Disposition: Patient tolerated procedure well. Patient to return in 1 week for follow-up.      No follow-ups on file.  .  No follow-ups on file.

## 2019-07-17 ENCOUNTER — Ambulatory Visit: Payer: BC Managed Care – PPO | Admitting: Orthotics

## 2019-07-17 ENCOUNTER — Other Ambulatory Visit: Payer: Self-pay

## 2019-07-17 DIAGNOSIS — M205X2 Other deformities of toe(s) (acquired), left foot: Secondary | ICD-10-CM

## 2019-07-17 DIAGNOSIS — L97522 Non-pressure chronic ulcer of other part of left foot with fat layer exposed: Secondary | ICD-10-CM | POA: Diagnosis not present

## 2019-07-17 DIAGNOSIS — M79671 Pain in right foot: Secondary | ICD-10-CM | POA: Diagnosis not present

## 2019-07-17 DIAGNOSIS — E118 Type 2 diabetes mellitus with unspecified complications: Secondary | ICD-10-CM

## 2019-07-17 NOTE — Progress Notes (Signed)
Patient will be getting one pair of A5000 shoes (private pay); and 1 pair of L3020 diabetic insert.  Private insurance and patieon knows [phone] will not be covered.

## 2019-07-28 ENCOUNTER — Ambulatory Visit (INDEPENDENT_AMBULATORY_CARE_PROVIDER_SITE_OTHER): Payer: BC Managed Care – PPO | Admitting: Podiatry

## 2019-07-28 ENCOUNTER — Encounter: Payer: Self-pay | Admitting: Podiatry

## 2019-07-28 ENCOUNTER — Other Ambulatory Visit: Payer: Self-pay

## 2019-07-28 DIAGNOSIS — M205X2 Other deformities of toe(s) (acquired), left foot: Secondary | ICD-10-CM

## 2019-07-28 DIAGNOSIS — E118 Type 2 diabetes mellitus with unspecified complications: Secondary | ICD-10-CM

## 2019-08-01 ENCOUNTER — Encounter: Payer: Self-pay | Admitting: Podiatry

## 2019-08-01 NOTE — Progress Notes (Signed)
Subjective:  Patient ID: Joel Kinds., male    DOB: 07/18/1955,  MRN: [id number]  Chief Complaint  Patient presents with  . Diabetic Ulcer    2wk f/u- Pt is doing alot better- wounds has been healing and feeling better     64 y.o. male presents for wound care.  Patient is following up after undergoing flexor tenotomy of the left second digit.  Patient states that he is doing a lot better.  The stitches were taken out today in office.  No clinical signs of infection noted.  The hammertoe has resolved and is sitting straight.   Review of Systems: Negative except as noted in the HPI. Denies N/V/F/Ch.  Past Medical History:  Diagnosis Date  . Colon cancer screening    03/2014 iFOB negative.  . Diabetes mellitus with complication (HCC) 09/12/10   Has mild DPN.  HbA1c 12.6% at the time of dx.  . Elevated blood pressure reading without diagnosis of hypertension 12/26/2010   White coat HTN  . 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  . Hyperlipidemia   . Increased prostate specific antigen (PSA) velocity 2016   ? due to cycling?  Pt chose repeat PSA instead of Biopsy and it showed lower PSA value when he had not been cycling prior to PSA check.  . Obesity, Class I, BMI 30-34.9   . Toe ulcer due to DM (HCC) fall 2012   Right great toe    Current Outpatient Medications:  .  atorvastatin (LIPITOR) 40 MG tablet, Take 1 tablet (40 mg total) by mouth daily., Disp: 30 tablet, Rfl: 0 .  B-D ULTRAFINE III SHORT PEN 31G X 8 MM MISC, daily. use as directed, Disp: , Rfl:  .  Cyanocobalamin (VITAMIN B 12 PO), Take by mouth., Disp: , Rfl:  .  EPINEPHrine 0.3 mg/0.3 mL IJ SOAJ injection, INJECT 0.3 MLS INTO THE MUSCLE ONCE FOR 1 DOSE, Disp: , Rfl:  .  FARXIGA 10 MG TABS tablet, Take 10 mg by mouth daily., Disp: , Rfl:  .  glucose blood (FREESTYLE LITE) test strip, Use to check blood sugar twice daily as directed, Disp: 100 each,  Rfl: 5 .  Insulin Pen Needle (B-D ULTRAFINE III SHORT PEN) 31G X 8 MM MISC, USE AS DIRECTED ONCE DAILY, Disp: 100 each, Rfl: 0 .  Lancet Devices (B-D LANCET DEVICE) MISC, Use as directed to check blood sugars 2 times per day, Disp: 60 each, Rfl: 5 .  liraglutide (VICTOZA) 18 MG/3ML SOPN, INJECT 1.8MG  SUBCUTANEOUSLY ONCE DAILY, Disp: 18 mL, Rfl: 2 .  lisinopril (ZESTRIL) 20 MG tablet, Take 1 tablet by mouth once daily, Disp: 30 tablet, Rfl: 0 .  metFORMIN (GLUCOPHAGE) 500 MG tablet, TAKE 2 TABLETS BY MOUTH TWICE DAILY WITH MEALS, Disp: 360 tablet, Rfl: 0 .  Multiple Vitamins-Minerals (CENTRUM PO), Take 1 tablet by mouth daily.  , Disp: , Rfl:  .  pioglitazone (ACTOS) 45 MG tablet, Take 1 tablet (45 mg total) by mouth daily., Disp: 30 tablet, Rfl: 0 .  Probiotic Product (PROBIOTIC-10 PO), Take 1 capsule by mouth daily., Disp: , Rfl:   Social History   Tobacco Use  Smoking Status Never Smoker  Smokeless Tobacco Former Neurosurgeon  . Types: Snuff  Tobacco Comment   Grizzly    Allergies  Allergen Reactions  . Other     Peanuts - choking    Objective:  There were no  vitals filed for this visit. There is no height or weight on file to calculate BMI. Constitutional Well developed. Well nourished.  Vascular Dorsalis pedis pulses palpable bilaterally. Posterior tibial pulses palpable bilaterally. Capillary refill normal to all digits.  No cyanosis or clubbing noted. Pedal hair growth normal.  Neurologic Normal speech. Oriented to person, place, and time. Protective sensation absent  Dermatologic   Left second toe nail is dystrophic thickened and is partially avulsed.  Concern for underlying wound as well.   Right hallux IPJ preulcerative lesion.  No underlying wounds noted upon debridement.  No pinpoint bleeding noted.  Hammertoe contracture resolved with good correction and alignment of the left second digit.  No contracture noted. Preulcerative callus formation resolved the left second  distal tip.       Orthopedic: No pain to palpation either foot.   Radiographs: None Assessment:   1. Toe contracture, left   2. Diabetes mellitus with complication [facility])    Plan:  Patient was evaluated and treated and all questions answered.  Ulcer left distal tip ulceration -Completely and clinically resolved.  No underlying ulceration noted with debridement.  Nail contusion/dystrophy second, left -Healing well the wound has decreased.  Right hallux IPJ preulcerative lesion -Completely resolved.  Hammertoe left second toe with pre-ulcerative callus -Good correction alignment noted.  Suture was taken out.      No follow-ups on file.  .  No follow-ups on file.

## 2019-08-07 ENCOUNTER — Ambulatory Visit: Payer: BC Managed Care – PPO | Admitting: Orthotics

## 2019-08-07 ENCOUNTER — Other Ambulatory Visit: Payer: Self-pay

## 2019-08-07 DIAGNOSIS — M205X2 Other deformities of toe(s) (acquired), left foot: Secondary | ICD-10-CM

## 2019-08-07 DIAGNOSIS — L97522 Non-pressure chronic ulcer of other part of left foot with fat layer exposed: Secondary | ICD-10-CM

## 2019-08-07 DIAGNOSIS — E118 Type 2 diabetes mellitus with unspecified complications: Secondary | ICD-10-CM

## 2019-08-07 NOTE — Progress Notes (Signed)
Patient came in today to pick up custom made foot orthotics.  The goals were accomplished and the patient reported no dissatisfaction with said orthotics.  Patient was advised of breakin period and how to report any issues. 

## 2019-08-22 ENCOUNTER — Other Ambulatory Visit: Payer: Self-pay | Admitting: Family Medicine

## 2019-09-11 ENCOUNTER — Other Ambulatory Visit: Payer: Self-pay | Admitting: Family Medicine

## 2019-11-27 ENCOUNTER — Other Ambulatory Visit: Payer: Self-pay | Admitting: Family Medicine

## 2019-11-28 ENCOUNTER — Telehealth: Payer: Self-pay | Admitting: Podiatry

## 2019-11-28 NOTE — Telephone Encounter (Signed)
Left message for pt that we received the shoes he ordered back in July finally and asked pt to call to discuss if still interested.

## 2020-04-18 ENCOUNTER — Other Ambulatory Visit: Payer: Self-pay

## 2020-04-18 ENCOUNTER — Ambulatory Visit (INDEPENDENT_AMBULATORY_CARE_PROVIDER_SITE_OTHER): Payer: BC Managed Care – PPO | Admitting: Podiatry

## 2020-04-18 DIAGNOSIS — L84 Corns and callosities: Secondary | ICD-10-CM

## 2020-04-18 DIAGNOSIS — E118 Type 2 diabetes mellitus with unspecified complications: Secondary | ICD-10-CM

## 2020-04-18 DIAGNOSIS — L989 Disorder of the skin and subcutaneous tissue, unspecified: Secondary | ICD-10-CM

## 2020-04-23 ENCOUNTER — Encounter: Payer: Self-pay | Admitting: Podiatry

## 2020-04-23 NOTE — Progress Notes (Signed)
Subjective:  Patient ID: Joel Kinds., male    DOB: Oct 02, 1955,  MRN: [id number]  Chief Complaint  Patient presents with  . Toe Injury    Right hallux     65 y.o. male presents with the above complaint.  Patient presents with complaint of right hallux preulcerative hyperkeratotic lesion/benign skin lesion.  Patient states it is came out of nowhere.  The toe is doing well.  Patient states that it is somewhat painful there is blood blister and bruising.  He wants to make sure that there is no ulceration.  He is a diabetic.  He denies any other acute complaints.  No clinical signs of infection.   Review of Systems: Negative except as noted in the HPI. Denies N/V/F/Ch.  Past Medical History:  Diagnosis Date  . Colon cancer screening    03/2014 iFOB negative.  . Diabetes mellitus with complication (HCC) 09/12/10   Has mild DPN.  HbA1c 12.6% at the time of dx.  . Elevated blood pressure reading without diagnosis of hypertension 12/26/2010   White coat HTN  . 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  . Hyperlipidemia   . Increased prostate specific antigen (PSA) velocity 2016   ? due to cycling?  Pt chose repeat PSA instead of Biopsy and it showed lower PSA value when he had not been cycling prior to PSA check.  . Obesity, Class I, BMI 30-34.9   . Toe ulcer due to DM (HCC) fall 2012   Right great toe    Current Outpatient Medications:  .  atorvastatin (LIPITOR) 40 MG tablet, Take 1 tablet (40 mg total) by mouth daily., Disp: 30 tablet, Rfl: 0 .  B-D ULTRAFINE III SHORT PEN 31G X 8 MM MISC, daily. use as directed, Disp: , Rfl:  .  Cyanocobalamin (VITAMIN B 12 PO), Take by mouth., Disp: , Rfl:  .  EPINEPHrine 0.3 mg/0.3 mL IJ SOAJ injection, INJECT 0.3 MLS INTO THE MUSCLE ONCE FOR 1 DOSE, Disp: , Rfl:  .  FARXIGA 10 MG TABS tablet, Take 10 mg by mouth daily., Disp: , Rfl:  .  glucose blood (FREESTYLE LITE) test  strip, Use to check blood sugar twice daily as directed, Disp: 100 each, Rfl: 5 .  Insulin Pen Needle (B-D ULTRAFINE III SHORT PEN) 31G X 8 MM MISC, USE AS DIRECTED ONCE DAILY, Disp: 100 each, Rfl: 0 .  Lancet Devices (B-D LANCET DEVICE) MISC, Use as directed to check blood sugars 2 times per day, Disp: 60 each, Rfl: 5 .  liraglutide (VICTOZA) 18 MG/3ML SOPN, INJECT 1.8MG  SUBCUTANEOUSLY ONCE DAILY, Disp: 18 mL, Rfl: 2 .  lisinopril (ZESTRIL) 20 MG tablet, Take 1 tablet by mouth once daily, Disp: 30 tablet, Rfl: 0 .  metFORMIN (GLUCOPHAGE) 500 MG tablet, TAKE 2 TABLETS BY MOUTH TWICE DAILY WITH MEALS, Disp: 360 tablet, Rfl: 0 .  Multiple Vitamins-Minerals (CENTRUM PO), Take 1 tablet by mouth daily.  , Disp: , Rfl:  .  pioglitazone (ACTOS) 45 MG tablet, Take 1 tablet (45 mg total) by mouth daily., Disp: 30 tablet, Rfl: 0 .  Probiotic Product (PROBIOTIC-10 PO), Take 1 capsule by mouth daily., Disp: , Rfl:   Social History   Tobacco Use  Smoking Status Never Smoker  Smokeless Tobacco Former Neurosurgeon  . Types: Snuff  Tobacco Comment   Grizzly    Allergies  Allergen Reactions  . Other  Peanuts - choking    Objective:  There were no vitals filed for this visit. There is no height or weight on file to calculate BMI. Constitutional Well developed. Well nourished.  Vascular Dorsalis pedis pulses palpable bilaterally. Posterior tibial pulses palpable bilaterally. Capillary refill normal to all digits.  No cyanosis or clubbing noted. Pedal hair growth normal.  Neurologic Normal speech. Oriented to person, place, and time. Epicritic sensation to light touch grossly present bilaterally.  Dermatologic  right hallux IPJ hyperkeratotic lesion.  Mild pain on palpation.  No underlying ulceration noted after debridement.  No clinical signs of infection noted.  No purulent drainage noted.  Orthopedic: Normal joint ROM without pain or crepitus bilaterally. No visible deformities. No bony  tenderness.   Radiographs: None Assessment:   1. Pre-ulcerative calluses   2. Diabetes mellitus with complication (HCC)   3. Benign skin lesion    Plan:  Patient was evaluated and treated and all questions answered.  Right hallux preulcerative lesion/benign skin lesion -I explained the patient the etiology the preulcerative lesion and various treatment options were discussed.  Using chisel blade to handle the lesion was debrided down to healthy striated tissue.  No complication noted.  No underlying ulceration noted.  No clinical signs of infection noted. -I discussed shoe gear modification with him in extensive detail he states understanding. -I have asked him to continue clinically monitoring the the lesion as it is very close to breakdown of skin and opening into an ulceration.  He states understanding.  No follow-ups on file.

## 2020-05-29 ENCOUNTER — Ambulatory Visit (INDEPENDENT_AMBULATORY_CARE_PROVIDER_SITE_OTHER): Payer: BC Managed Care – PPO | Admitting: Podiatry

## 2020-05-29 ENCOUNTER — Other Ambulatory Visit: Payer: Self-pay

## 2020-05-29 DIAGNOSIS — E118 Type 2 diabetes mellitus with unspecified complications: Secondary | ICD-10-CM

## 2020-05-29 DIAGNOSIS — L989 Disorder of the skin and subcutaneous tissue, unspecified: Secondary | ICD-10-CM

## 2020-05-29 DIAGNOSIS — L84 Corns and callosities: Secondary | ICD-10-CM | POA: Diagnosis not present

## 2020-05-31 ENCOUNTER — Encounter: Payer: Self-pay | Admitting: Podiatry

## 2020-05-31 NOTE — Progress Notes (Signed)
Subjective:  Patient ID: Joel Kinds., male    DOB: November 28, 1955,  MRN: [id number]  Chief Complaint  Patient presents with  . Callouses    Right hallux callus     65 y.o. male presents with the above complaint.  Patient presents for follow-up of right hallux preulcerative callus/benign skin lesion.  Patient states it has completely healed he is a diabetic he was worried about ulceration.  At this time it has not reoccurred.  Denies any other acute complaints.  Review of Systems: Negative except as noted in the HPI. Denies N/V/F/Ch.  Past Medical History:  Diagnosis Date  . Colon cancer screening    03/2014 iFOB negative.  . Diabetes mellitus with complication (HCC) 09/12/10   Has mild DPN.  HbA1c 12.6% at the time of dx.  . Elevated blood pressure reading without diagnosis of hypertension 12/26/2010   White coat HTN  . 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  . Hyperlipidemia   . Increased prostate specific antigen (PSA) velocity 2016   ? due to cycling?  Pt chose repeat PSA instead of Biopsy and it showed lower PSA value when he had not been cycling prior to PSA check.  . Obesity, Class I, BMI 30-34.9   . Toe ulcer due to DM (HCC) fall 2012   Right great toe    Current Outpatient Medications:  .  atorvastatin (LIPITOR) 40 MG tablet, Take 1 tablet (40 mg total) by mouth daily., Disp: 30 tablet, Rfl: 0 .  B-D ULTRAFINE III SHORT PEN 31G X 8 MM MISC, daily. use as directed, Disp: , Rfl:  .  Cyanocobalamin (VITAMIN B 12 PO), Take by mouth., Disp: , Rfl:  .  EPINEPHrine 0.3 mg/0.3 mL IJ SOAJ injection, INJECT 0.3 MLS INTO THE MUSCLE ONCE FOR 1 DOSE, Disp: , Rfl:  .  FARXIGA 10 MG TABS tablet, Take 10 mg by mouth daily., Disp: , Rfl:  .  glucose blood (FREESTYLE LITE) test strip, Use to check blood sugar twice daily as directed, Disp: 100 each, Rfl: 5 .  Insulin Pen Needle (B-D ULTRAFINE III SHORT PEN) 31G X 8 MM  MISC, USE AS DIRECTED ONCE DAILY, Disp: 100 each, Rfl: 0 .  Lancet Devices (B-D LANCET DEVICE) MISC, Use as directed to check blood sugars 2 times per day, Disp: 60 each, Rfl: 5 .  liraglutide (VICTOZA) 18 MG/3ML SOPN, INJECT 1.8MG  SUBCUTANEOUSLY ONCE DAILY, Disp: 18 mL, Rfl: 2 .  lisinopril (ZESTRIL) 20 MG tablet, Take 1 tablet by mouth once daily, Disp: 30 tablet, Rfl: 0 .  metFORMIN (GLUCOPHAGE) 500 MG tablet, TAKE 2 TABLETS BY MOUTH TWICE DAILY WITH MEALS, Disp: 360 tablet, Rfl: 0 .  Multiple Vitamins-Minerals (CENTRUM PO), Take 1 tablet by mouth daily.  , Disp: , Rfl:  .  pioglitazone (ACTOS) 45 MG tablet, Take 1 tablet (45 mg total) by mouth daily., Disp: 30 tablet, Rfl: 0 .  Probiotic Product (PROBIOTIC-10 PO), Take 1 capsule by mouth daily., Disp: , Rfl:   Social History   Tobacco Use  Smoking Status Never Smoker  Smokeless Tobacco Former Neurosurgeon  . Types: Snuff  Tobacco Comment   Grizzly    Allergies  Allergen Reactions  . Other     Peanuts - choking    Objective:  There were no vitals filed for this visit. There is no height or weight on file to calculate BMI. Constitutional Well developed.  Well nourished.  Vascular Dorsalis pedis pulses palpable bilaterally. Posterior tibial pulses palpable bilaterally. Capillary refill normal to all digits.  No cyanosis or clubbing noted. Pedal hair growth normal.  Neurologic Normal speech. Oriented to person, place, and time. Epicritic sensation to light touch grossly present bilaterally.  Dermatologic  no further right hallux IPJ hyperkeratotic lesion.  No pain on palpation.  No underlying ulceration noted after debridement.  No clinical signs of infection noted.  No purulent drainage noted.  Orthopedic: Normal joint ROM without pain or crepitus bilaterally. No visible deformities. No bony tenderness.   Radiographs: None Assessment:   1. Pre-ulcerative calluses   2. Diabetes mellitus with complication (HCC)   3. Benign skin  lesion    Plan:  Patient was evaluated and treated and all questions answered.  Right hallux preulcerative lesion/benign skin lesion -Clinically healed.  At this time I discussed shoe gear modification more preventive options to prevent this from recurring.  I discussed this with patient extensive detail.  Patient states understanding.  No follow-ups on file.

## 2021-08-18 IMAGING — MR MR FOOT*R* WO/W CM
5 of 9 series · 19 of 40 positions shown · IV contrast (Yes)
Comparison: Right foot x-rays dated December 22, 2018. MRI right
foot dated September 13, 2010.

CLINICAL DATA: Nonhealing diabetic foot ulcer.

EXAM:
MRI OF THE RIGHT FOREFOOT WITHOUT AND WITH CONTRAST
TECHNIQUE: Multiplanar, multisequence MR imaging of the right forefoot was
performed before and after the administration of intravenous
contrast.
CONTRAST:  10mL GADAVIST GADOBUTROL 1 MMOL/ML IV SOLN

[Series 4: T1 · axial · 3.0mm · 0.39mm/px · z∈[-99,+0]mm · 3 of 30 slices shown (1 of 2)]
[im 1/30]
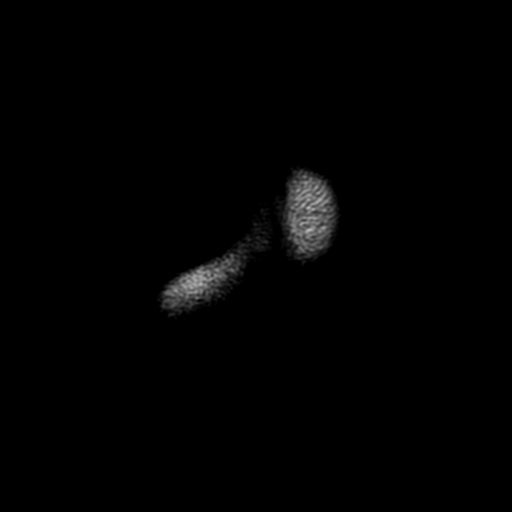
[im 15/30]
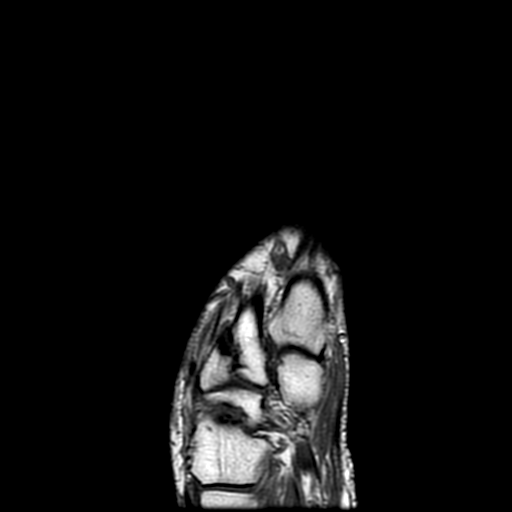
[im 30/30]
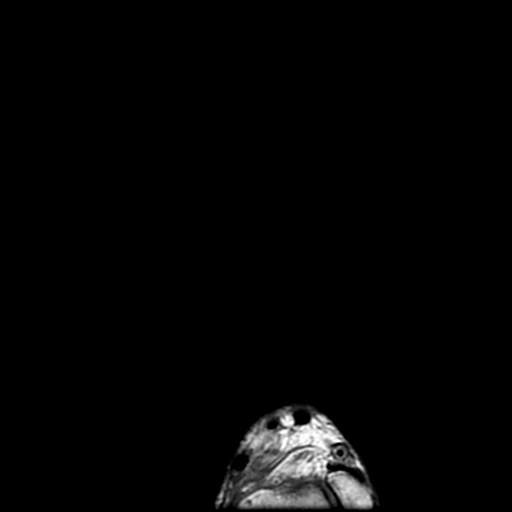

[Series 7: T1 · coronal · 3.0mm · 0.35mm/px · 6 of 51 slices shown (2 of 2)]
[im 1/51]
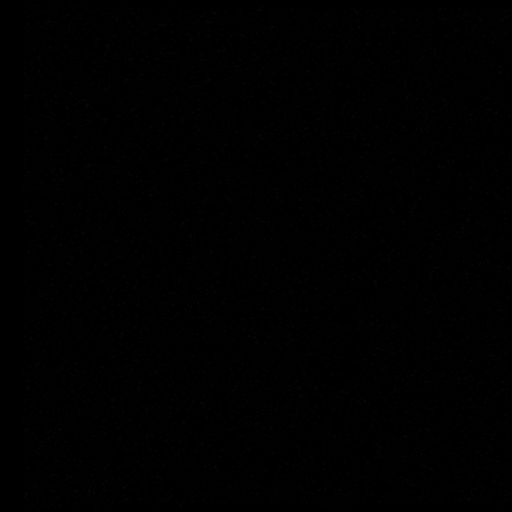
[im 11/51]
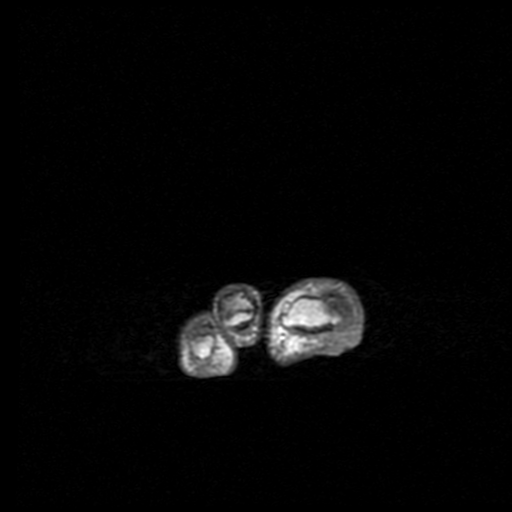
[im 21/51]
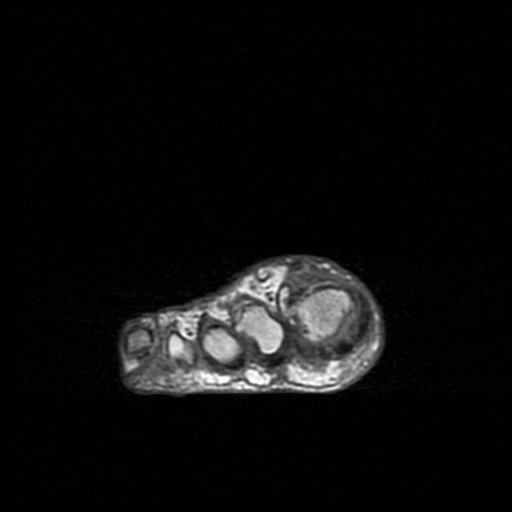
[im 31/51]
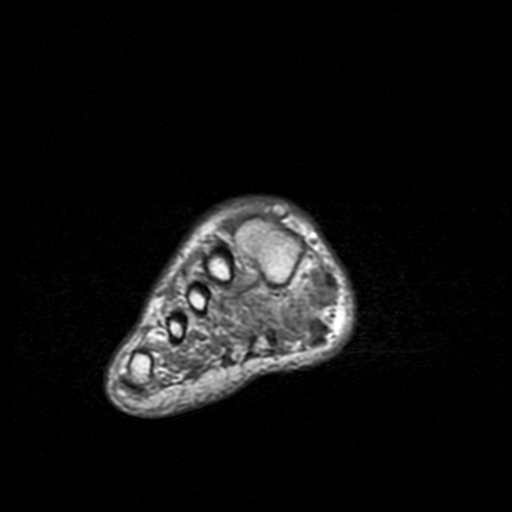
[im 41/51]
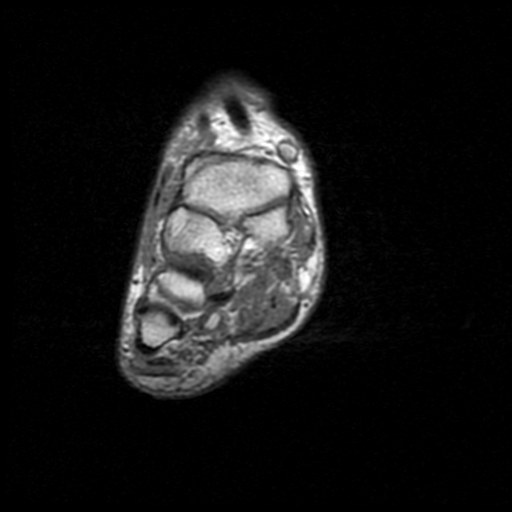
[im 51/51]
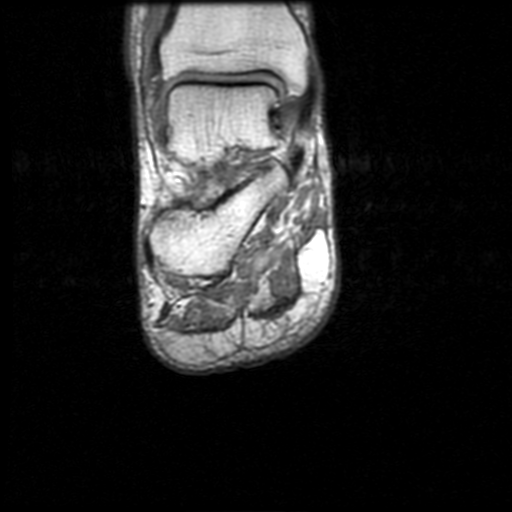

[Series 8: T1 fat-sat · axial · non-contrast · 3.0mm · 0.39mm/px · z∈[-99,+0]mm · 4 of 30 slices shown]
[im 1/30]
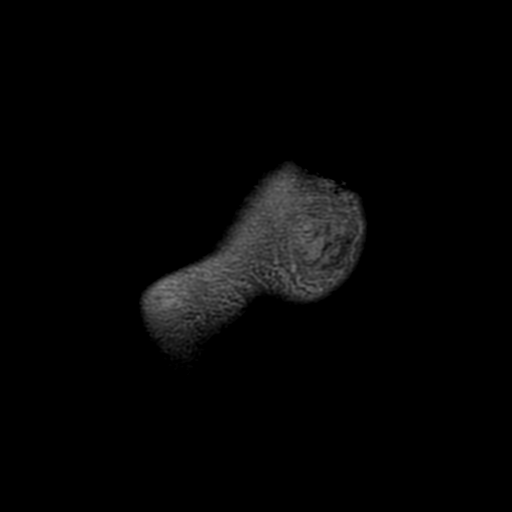
[im 10/30]
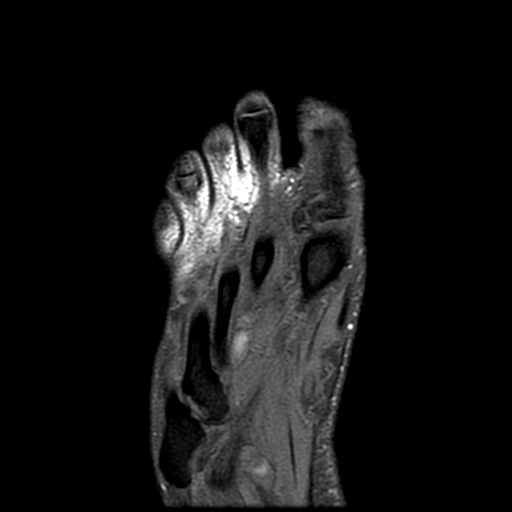
[im 20/30]
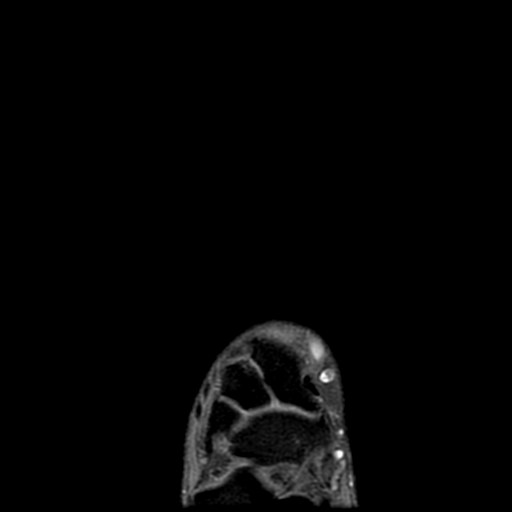
[im 30/30]
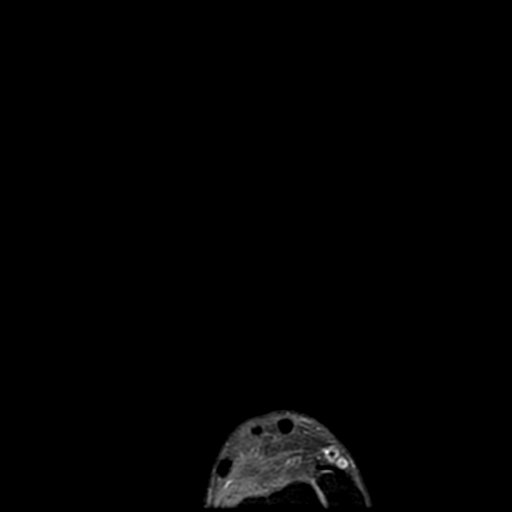

[Series 9: T1 fat-sat post-contrast · axial · 3.0mm · 0.39mm/px · z∈[-99,+0]mm · 4 of 30 slices shown (1 of 2)]
[im 1/30]
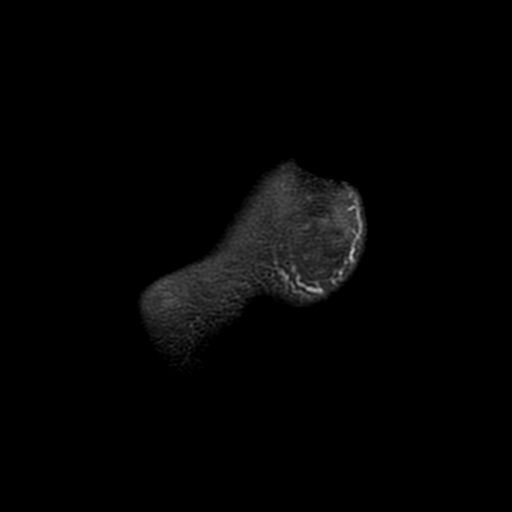
[im 10/30]
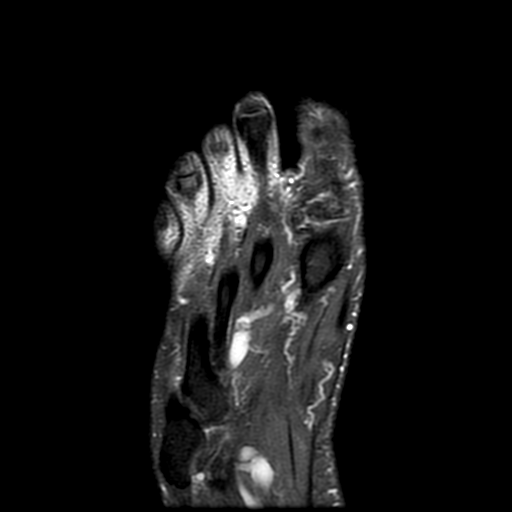
[im 20/30]
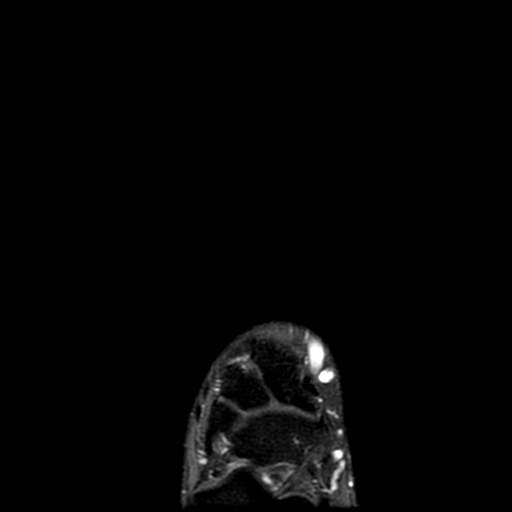
[im 30/30]
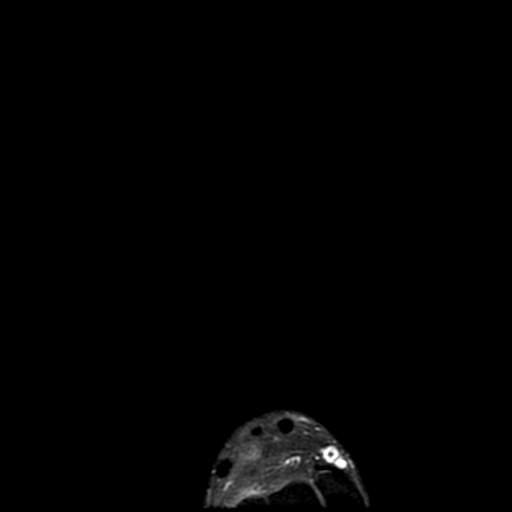

[Series 10: T1 fat-sat post-contrast · coronal · 3.0mm · 0.35mm/px · 2 of 51 slices shown (2 of 2)]
[im 1/51]
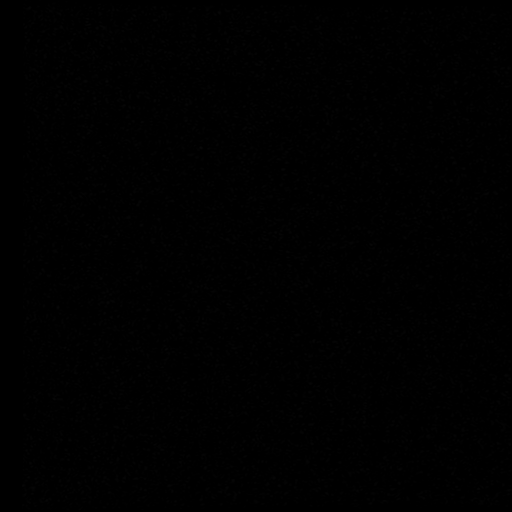
[im 11/51]
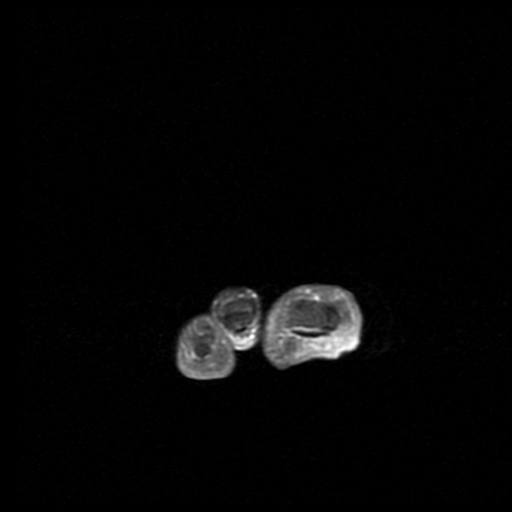

[19 of 40 positions shown; findings below may reference images not displayed]

FINDINGS: Bones/Joint/Cartilage

Poor fat saturation on the axial T2 images. No marrow signal
abnormality. No fracture or dislocation. Mild first IP joint and
moderate first MTP joint osteoarthritis. No joint effusion. Similar
appearing degenerative changes within a bipartite lateral hallux
sesamoid.

Ligaments

Collateral ligaments are intact.  Lisfranc ligament is intact.

Muscles and Tendons
Flexor, peroneal and extensor compartment tendons are intact. Mild
atrophy of the intrinsic muscles of the foot.

Soft tissue
Small plantar ulceration at the medial base of great toe with
adjacent soft tissue enhancement. No fluid collection or hematoma.
No soft tissue mass.
IMPRESSION: 1. Small plantar ulceration at the medial base of the great toe with
adjacent soft tissue enhancement, consistent with cellulitis. No
evidence of osteomyelitis.
2. Mild great toe osteoarthritis.

## 2022-02-24 ENCOUNTER — Ambulatory Visit: Payer: Medicare PPO | Admitting: Podiatry

## 2022-02-24 DIAGNOSIS — L539 Erythematous condition, unspecified: Secondary | ICD-10-CM

## 2022-02-24 DIAGNOSIS — L97512 Non-pressure chronic ulcer of other part of right foot with fat layer exposed: Secondary | ICD-10-CM | POA: Diagnosis not present

## 2022-02-24 DIAGNOSIS — E118 Type 2 diabetes mellitus with unspecified complications: Secondary | ICD-10-CM | POA: Diagnosis not present

## 2022-02-24 NOTE — Progress Notes (Signed)
Subjective:  Patient ID: Joel Malkin., male    DOB: 1955/06/20,  MRN: [id number]  Chief Complaint  Patient presents with   Callouses    67 y.o. male presents with the above complaint.  Patient presents with complaint of right hallux ulceration with fat layer exposed at the IPJ.  Patient states that just came out of nowhere is progressive gotten worse wanted get it evaluated he has not seen anyone else prior to seeing me.  He is a diabetic with last 8.3.  He wants to discuss treatment options for this   Review of Systems: Negative except as noted in the HPI. Denies N/V/F/Ch.  Past Medical History:  Diagnosis Date   Colon cancer screening    03/2014 iFOB negative.   Diabetes mellitus with complication ([place]) A999333   Has mild DPN.  HbA1c 12.6% at the time of dx.   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   Hyperlipidemia    Increased prostate specific antigen (PSA) velocity 2016   ? due to cycling?  Pt chose repeat PSA instead of Biopsy and it showed lower PSA value when he had not been cycling prior to PSA check.   Obesity, Class I, BMI 30-34.9    Toe ulcer due to DM ([place]) fall 2012   Right great toe    Current Outpatient Medications:    atorvastatin (LIPITOR) 40 MG tablet, Take 1 tablet (40 mg total) by mouth daily., Disp: 30 tablet, Rfl: 0   B-D ULTRAFINE III SHORT PEN 31G X 8 MM MISC, daily. use as directed, Disp: , Rfl:    Cyanocobalamin (VITAMIN B 12 PO), Take by mouth., Disp: , Rfl:    doxycycline (VIBRA-TABS) 100 MG tablet, Take 1 tablet (100 mg total) by mouth 2 (two) times daily., Disp: 60 tablet, Rfl: 0   EPINEPHrine 0.3 mg/0.3 mL IJ SOAJ injection, INJECT 0.3 MLS INTO THE MUSCLE ONCE FOR 1 DOSE, Disp: , Rfl:    FARXIGA 10 MG TABS tablet, Take 10 mg by mouth daily., Disp: , Rfl:    glucose blood  (FREESTYLE LITE) test strip, Use to check blood sugar twice daily as directed, Disp: 100 each, Rfl: 5   Insulin Pen Needle (B-D ULTRAFINE III SHORT PEN) 31G X 8 MM MISC, USE AS DIRECTED ONCE DAILY, Disp: 100 each, Rfl: 0   Lancet Devices (B-D LANCET DEVICE) MISC, Use as directed to check blood sugars 2 times per day, Disp: 60 each, Rfl: 5   liraglutide (VICTOZA) 18 MG/3ML SOPN, INJECT 1.'8MG'$  SUBCUTANEOUSLY ONCE DAILY, Disp: 18 mL, Rfl: 2   lisinopril (ZESTRIL) 20 MG tablet, Take 1 tablet by mouth once daily, Disp: 30 tablet, Rfl: 0   metFORMIN (GLUCOPHAGE) 500 MG tablet, TAKE 2 TABLETS BY MOUTH TWICE DAILY WITH MEALS, Disp: 360 tablet, Rfl: 0   Multiple Vitamins-Minerals (CENTRUM PO), Take 1 tablet by mouth daily.  , Disp: , Rfl:    pioglitazone (ACTOS) 45 MG tablet, Take 1 tablet (45 mg total) by mouth daily., Disp: 30 tablet, Rfl: 0   Probiotic Product (PROBIOTIC-10 PO), Take 1 capsule by mouth daily., Disp: , Rfl:   Social History   Tobacco Use  Smoking Status Never  Smokeless Tobacco Former   Types: Snuff   Quit date: 01/06/2000  Tobacco Comments   Grizzly    Allergies  Allergen Reactions   Other     Peanuts - choking    Objective:  There were no vitals filed for this visit. There is no height or weight on file to calculate BMI. Constitutional Well developed. Well nourished.  Vascular Dorsalis pedis pulses palpable bilaterally. Posterior tibial pulses palpable bilaterally. Capillary refill normal to all digits.  No cyanosis or clubbing noted. Pedal hair growth normal.  Neurologic Normal speech. Oriented to person, place, and time. Epicritic sensation to light touch grossly present bilaterally.  Dermatologic Right hallux IPJ ulceration with fat layer exposed mild erythema noted.  No other malodor present.  No purulent drainage noted.  Does not probe down to bone.  Orthopedic: Normal joint ROM without pain or crepitus bilaterally. No visible deformities. No bony tenderness.    Radiographs: None Assessment:   1. Toe ulcer, right, with fat layer exposed ([place])   2. Erythema   3. Diabetes mellitus with complication [facility])    Plan:  Patient was evaluated and treated and all questions answered.  Right hallux ulceration with fat layer exposed -Minimal debridement was carried out in standard technique. -Given the amount of erythema that is present patient will benefit from doxycycline to help with skin and soft tissue prophylaxis -Surgical shoe was dispensed for skin and soft tissue prophylaxis -Aggressive Betadine wet-to-dry dressing -  No follow-ups on file. Right hallux IPJ ulceration fat layer exposed erythema Doxy debridement Done.  Surgical shoe dispensed

## 2022-02-25 ENCOUNTER — Telehealth: Payer: Self-pay | Admitting: Podiatry

## 2022-02-25 MED ORDER — DOXYCYCLINE HYCLATE 100 MG PO TABS: 100.0000 mg | ORAL_TABLET | Freq: Two times a day (BID) | ORAL | 0 refills | Status: DC

## 2022-02-25 NOTE — Addendum Note (Signed)
Addended by: Boneta Lucks on: 02/25/2022 11:08 AM   Modules accepted: Orders

## 2022-02-25 NOTE — Telephone Encounter (Signed)
Patient was suppose to receive an antibiotic, he was seen yesterday by Dr. Posey Pronto

## 2022-03-17 ENCOUNTER — Ambulatory Visit: Payer: Medicare PPO | Admitting: Podiatry

## 2022-03-17 DIAGNOSIS — L97512 Non-pressure chronic ulcer of other part of right foot with fat layer exposed: Secondary | ICD-10-CM | POA: Diagnosis not present

## 2022-03-17 NOTE — Progress Notes (Signed)
Subjective:  Patient ID: Joel Malkin., male    DOB: 10-18-1955,  MRN: [id number]  Chief Complaint  Patient presents with   Callouses    67 y.o. male presents with the above complaint.  Patient presents for follow-up of right hallux ulceration.  He states doing a lot better.  He thinks he has healed it.  His pain is gone and he denies any other acute complaints   Review of Systems: Negative except as noted in the HPI. Denies N/V/F/Ch.  Past Medical History:  Diagnosis Date   Colon cancer screening    03/2014 iFOB negative.   Diabetes mellitus with complication ([place]) A999333   Has mild DPN.  HbA1c 12.6% at the time of dx.   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   Hyperlipidemia    Increased prostate specific antigen (PSA) velocity 2016   ? due to cycling?  Pt chose repeat PSA instead of Biopsy and it showed lower PSA value when he had not been cycling prior to PSA check.   Obesity, Class I, BMI 30-34.9    Toe ulcer due to DM ([place]) fall 2012   Right great toe    Current Outpatient Medications:    atorvastatin (LIPITOR) 40 MG tablet, Take 1 tablet (40 mg total) by mouth daily., Disp: 30 tablet, Rfl: 0   B-D ULTRAFINE III SHORT PEN 31G X 8 MM MISC, daily. use as directed, Disp: , Rfl:    Cyanocobalamin (VITAMIN B 12 PO), Take by mouth., Disp: , Rfl:    doxycycline (VIBRA-TABS) 100 MG tablet, Take 1 tablet (100 mg total) by mouth 2 (two) times daily., Disp: 60 tablet, Rfl: 0   EPINEPHrine 0.3 mg/0.3 mL IJ SOAJ injection, INJECT 0.3 MLS INTO THE MUSCLE ONCE FOR 1 DOSE, Disp: , Rfl:    FARXIGA 10 MG TABS tablet, Take 10 mg by mouth daily., Disp: , Rfl:    glucose blood (FREESTYLE LITE) test strip, Use to check blood sugar twice daily as directed, Disp: 100 each, Rfl: 5   Insulin Pen Needle (B-D ULTRAFINE III SHORT  PEN) 31G X 8 MM MISC, USE AS DIRECTED ONCE DAILY, Disp: 100 each, Rfl: 0   Lancet Devices (B-D LANCET DEVICE) MISC, Use as directed to check blood sugars 2 times per day, Disp: 60 each, Rfl: 5   liraglutide (VICTOZA) 18 MG/3ML SOPN, INJECT 1.8MG  SUBCUTANEOUSLY ONCE DAILY, Disp: 18 mL, Rfl: 2   lisinopril (ZESTRIL) 20 MG tablet, Take 1 tablet by mouth once daily, Disp: 30 tablet, Rfl: 0   metFORMIN (GLUCOPHAGE) 500 MG tablet, TAKE 2 TABLETS BY MOUTH TWICE DAILY WITH MEALS, Disp: 360 tablet, Rfl: 0   Multiple Vitamins-Minerals (CENTRUM PO), Take 1 tablet by mouth daily.  , Disp: , Rfl:    pioglitazone (ACTOS) 45 MG tablet, Take 1 tablet (45 mg total) by mouth daily., Disp: 30 tablet, Rfl: 0   Probiotic Product (PROBIOTIC-10 PO), Take 1 capsule by mouth daily., Disp: , Rfl:   Social History   Tobacco Use  Smoking Status Never  Smokeless Tobacco Former   Types: Snuff   Quit date: 01/06/2000  Tobacco Comments   Grizzly    Allergies  Allergen Reactions   Other     Peanuts - choking    Objective:  There were no vitals filed for this visit.  There is no height or weight on file to calculate BMI. Constitutional Well developed. Well nourished.  Vascular Dorsalis pedis pulses palpable bilaterally. Posterior tibial pulses palpable bilaterally. Capillary refill normal to all digits.  No cyanosis or clubbing noted. Pedal hair growth normal.  Neurologic Normal speech. Oriented to person, place, and time. Epicritic sensation to light touch grossly present bilaterally.  Dermatologic No further right hallux ulceration noted.  No purulent drainage noted.  No wound noted.  Orthopedic: Normal joint ROM without pain or crepitus bilaterally. No visible deformities. No bony tenderness.   Radiographs: None Assessment:   1. Toe ulcer, right, with fat layer exposed ([place])   2. Erythema   3. Diabetes mellitus with complication [facility])    Plan:  Patient was evaluated and treated and all questions  answered.  Right hallux ulceration with fat layer exposed -Clinically healed and officially discharged from my care if any foot and ankle issues arise in the future he will come back and see me.  I discussed prevention techniques offloading technique.  He states understanding

## 2022-12-17 ENCOUNTER — Ambulatory Visit (INDEPENDENT_AMBULATORY_CARE_PROVIDER_SITE_OTHER): Payer: Medicare PPO

## 2022-12-17 ENCOUNTER — Ambulatory Visit (INDEPENDENT_AMBULATORY_CARE_PROVIDER_SITE_OTHER): Payer: Medicare PPO | Admitting: Podiatry

## 2022-12-17 DIAGNOSIS — L97512 Non-pressure chronic ulcer of other part of right foot with fat layer exposed: Secondary | ICD-10-CM

## 2022-12-17 MED ORDER — DOXYCYCLINE HYCLATE 100 MG PO TABS: 100.0000 mg | ORAL_TABLET | Freq: Two times a day (BID) | ORAL | 0 refills | Status: AC

## 2022-12-17 NOTE — Progress Notes (Signed)
Subjective: No chief complaint on file.   67 year old male presents the office today for concerns of a wound on the right foot.  He states that he developed what seems to be a ulcer and it did drain.  Since then has been doing better.  He had some redness around it.  Denies any fevers or chills.  He brings in a surgical shoe but he is wearing his regular shoe.  Objective: AAO x3, NAD DP/PT pulses palpable bilaterally, CRT less than 3 seconds On the plantar aspect of the right hallux is what appears to be an ulceration with surrounding old blister formation.  There is currently no purulence or drainage in this blister.  Sharply debrided the area reveals superficial granular wound.  There is some localized erythema but there is no ascending cellulitis.  No fluctuation or crepitation.  There is no malodor.  There is no probing, undermining or tunneling. No pain with calf compression, swelling, warmth, erythema   Picture is prior to debridement.  Assessment: 67 year old male ulceration right hallux  Plan: -All treatment options discussed with the patient including all alternatives, risks, complications.  -X-rays obtained reviewed.  3 views obtained.  No cortical changes suggest osteomyelitis at this time.  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  Digital contracture present. -Medically necessary wound debridement was performed today.  I sharply debrided hyperkeratotic lesion that is pictured above.  Not able to measure the wound prior to debridement.  I debrided down to healthy, pink tissue removed nonviable devitalized tissue.  There is a superficial granular wound present.  Cleaned the area with saline.  Silvadene applied followed by dressing. -Prescribed doxycycline -Surgical shoe for offloading -Patient encouraged to call the office with any questions, concerns, change in symptoms.   Vivi Barrack DPM

## 2023-01-15 ENCOUNTER — Encounter: Payer: Self-pay | Admitting: Podiatry

## 2023-01-15 ENCOUNTER — Ambulatory Visit: Payer: Medicare PPO | Admitting: Podiatry

## 2023-01-15 DIAGNOSIS — L97512 Non-pressure chronic ulcer of other part of right foot with fat layer exposed: Secondary | ICD-10-CM

## 2023-01-15 DIAGNOSIS — N521 Erectile dysfunction due to diseases classified elsewhere: Secondary | ICD-10-CM | POA: Insufficient documentation

## 2023-01-15 MED ORDER — DOXYCYCLINE HYCLATE 100 MG PO TABS: 100.0000 mg | ORAL_TABLET | Freq: Two times a day (BID) | ORAL | 0 refills | Status: AC

## 2023-01-15 NOTE — Progress Notes (Signed)
 Subjective:  Patient ID: [name]., male    DOB: Jul 26, 1955,  MRN: [id number]  Chief Complaint  Patient presents with   Foot Ulcer    Follow up ulcer hallux right   I have really tried to take care of it, but to me it doesn't look that good. They said my last A1c was 7.something. Its gotten blistered    68 y.o. male presents with the above complaint.  Patient presents with complaint of right hallux ulceration with fat layer exposed at the IPJ.  He states it came back again.  His last A1c was 7 something.  He is a diabetic he just wants to get it evaluated  Review of Systems: Negative except as noted in the HPI. Denies N/V/F/Ch.  Past Medical History:  Diagnosis Date   Colon cancer screening    03/2014 iFOB negative.   Diabetes mellitus with complication ([facility]) 09/12/10   Has mild DPN.  HbA1c 12.6% at the time of dx.   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   Hyperlipidemia    Increased prostate specific antigen (PSA) velocity 2016   ? due to cycling?  Pt chose repeat PSA instead of Biopsy and it showed lower PSA value when he had not been cycling prior to PSA check.   Obesity, Class I, BMI 30-34.9    Toe ulcer due to DM ([facility]) fall 2012   Right great toe    Current Outpatient Medications:    doxycycline  (VIBRA -TABS) 100 MG tablet, Take 1 tablet (100 mg total) by mouth 2 (two) times daily., Disp: 20 tablet, Rfl: 0   sildenafil (VIAGRA) 100 MG tablet, 1 tablet as needed Orally Once a day for 10 days, Disp: , Rfl:    atorvastatin  (LIPITOR) 40 MG tablet, Take 1 tablet (40 mg total) by mouth daily., Disp: 30 tablet, Rfl: 0   B-D ULTRAFINE III SHORT PEN 31G X 8 MM MISC, daily. use as directed, Disp: , Rfl:    Cyanocobalamin (VITAMIN B 12 PO), Take by mouth., Disp: , Rfl:    doxycycline  (VIBRA -TABS) 100 MG tablet,  Take 1 tablet (100 mg total) by mouth 2 (two) times daily., Disp: 20 tablet, Rfl: 0   EPINEPHrine  0.3 mg/0.3 mL IJ SOAJ injection, INJECT 0.3 MLS INTO THE MUSCLE ONCE FOR 1 DOSE, Disp: , Rfl:    FARXIGA 10 MG TABS tablet, Take 10 mg by mouth daily., Disp: , Rfl:    glucose blood (FREESTYLE LITE) test strip, Use to check blood sugar twice daily as directed, Disp: 100 each, Rfl: 5   Insulin  Pen Needle (B-D ULTRAFINE III SHORT PEN) 31G X 8 MM MISC, USE AS DIRECTED ONCE DAILY, Disp: 100 each, Rfl: 0   Lancet Devices (B-D LANCET DEVICE) MISC, Use as directed to check blood sugars 2 times per day, Disp: 60 each, Rfl: 5   liraglutide  (VICTOZA ) 18 MG/3ML SOPN, INJECT 1.8MG  SUBCUTANEOUSLY ONCE DAILY, Disp: 18 mL, Rfl: 2   lisinopril  (ZESTRIL ) 20 MG tablet, Take 1 tablet by mouth once daily, Disp: 30 tablet, Rfl: 0   metFORMIN  (GLUCOPHAGE ) 500 MG tablet, TAKE 2 TABLETS BY MOUTH TWICE DAILY WITH MEALS, Disp: 360 tablet, Rfl: 0   Multiple Vitamins-Minerals (CENTRUM PO), Take 1 tablet by mouth daily.  , Disp: , Rfl:    OZEMPIC, 1 MG/DOSE, 4 MG/3ML  SOPN, Inject into the skin., Disp: , Rfl:    pioglitazone  (ACTOS ) 45 MG tablet, Take 1 tablet (45 mg total) by mouth daily., Disp: 30 tablet, Rfl: 0   Probiotic Product (PROBIOTIC-10 PO), Take 1 capsule by mouth daily., Disp: , Rfl:   Social History   Tobacco Use  Smoking Status Never  Smokeless Tobacco Former   Types: Snuff   Quit date: 01/06/2000  Tobacco Comments   Grizzly    Allergies  Allergen Reactions   Other     Peanuts - choking    Peanut (Diagnostic) Hives    Other Reaction(s): Unknown   Objective:  There were no vitals filed for this visit. There is no height or weight on file to calculate BMI. Constitutional Well developed. Well nourished.  Vascular Dorsalis pedis pulses palpable bilaterally. Posterior tibial pulses palpable bilaterally. Capillary refill normal to all digits.  No cyanosis or clubbing noted. Pedal hair growth normal.   Neurologic Normal speech. Oriented to person, place, and time. Epicritic sensation to light touch grossly present bilaterally.  Dermatologic Right hallux IPJ ulceration with fat layer exposed mild erythema noted.  No other malodor present.  No purulent drainage noted.  Does not probe down to bone.  Orthopedic: Normal joint ROM without pain or crepitus bilaterally. No visible deformities. No bony tenderness.   Radiographs: None Assessment:   No diagnosis found.  Plan:  Patient was evaluated and treated and all questions answered.  Right hallux ulceration with fat layer exposed -Minimal debridement was carried out in standard technique. -Given the amount of erythema that is present patient will benefit from doxycycline  to help with skin and soft tissue prophylaxis -Surgical shoe was dispensed for skin and soft tissue prophylaxis -Aggressive Betadine wet-to-dry dressing

## 2023-02-05 ENCOUNTER — Ambulatory Visit: Payer: Medicare PPO | Admitting: Podiatry

## 2023-03-03 ENCOUNTER — Ambulatory Visit: Payer: Medicare PPO | Admitting: Podiatry

## 2023-12-23 ENCOUNTER — Ambulatory Visit: Admitting: Podiatry

## 2023-12-23 DIAGNOSIS — L97512 Non-pressure chronic ulcer of other part of right foot with fat layer exposed: Secondary | ICD-10-CM | POA: Diagnosis not present

## 2023-12-23 NOTE — Progress Notes (Signed)
 Subjective:  Patient ID: Joel JINNY Trudy Mickey., male    DOB: 04-28-1955,  MRN: [id number]  Chief Complaint  Patient presents with   Foot Ulcer    68 y.o. male presents with the above complaint.  Patient presents with complaint of right hallux ulceration with fat layer exposed at the IPJ.  He states it came back again.  His last A1c was 7 something.  He is a diabetic he just wants to get it evaluated  Review of Systems: Negative except as noted in the HPI. Denies N/V/F/Ch.  Past Medical History:  Diagnosis Date   Colon cancer screening    03/2014 iFOB negative.   Diabetes mellitus with complication ([facility]) 09/12/10   Has mild DPN.  HbA1c 12.6% at the time of dx.   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   Hyperlipidemia    Increased prostate specific antigen (PSA) velocity 2016   ? due to cycling?  Pt chose repeat PSA instead of Biopsy and it showed lower PSA value when he had not been cycling prior to PSA check.   Obesity, Class I, BMI 30-34.9    Toe ulcer due to DM ([facility]) fall 2012   Right great toe    Current Outpatient Medications:    atorvastatin  (LIPITOR) 40 MG tablet, Take 1 tablet (40 mg total) by mouth daily., Disp: 30 tablet, Rfl: 0   B-D ULTRAFINE III SHORT PEN 31G X 8 MM MISC, daily. use as directed, Disp: , Rfl:    Cyanocobalamin (VITAMIN B 12 PO), Take by mouth., Disp: , Rfl:    doxycycline  (VIBRA -TABS) 100 MG tablet, Take 1 tablet (100 mg total) by mouth 2 (two) times daily., Disp: 20 tablet, Rfl: 0   doxycycline  (VIBRA -TABS) 100 MG tablet, Take 1 tablet (100 mg total) by mouth 2 (two) times daily., Disp: 20 tablet, Rfl: 0   EPINEPHrine  0.3 mg/0.3 mL IJ SOAJ injection, INJECT 0.3 MLS INTO THE MUSCLE ONCE FOR 1 DOSE, Disp: , Rfl:    FARXIGA 10 MG TABS tablet, Take 10 mg by mouth daily., Disp: , Rfl:    glucose  blood (FREESTYLE LITE) test strip, Use to check blood sugar twice daily as directed, Disp: 100 each, Rfl: 5   Insulin  Pen Needle (B-D ULTRAFINE III SHORT PEN) 31G X 8 MM MISC, USE AS DIRECTED ONCE DAILY, Disp: 100 each, Rfl: 0   Lancet Devices (B-D LANCET DEVICE) MISC, Use as directed to check blood sugars 2 times per day, Disp: 60 each, Rfl: 5   liraglutide  (VICTOZA ) 18 MG/3ML SOPN, INJECT 1.8MG  SUBCUTANEOUSLY ONCE DAILY, Disp: 18 mL, Rfl: 2   lisinopril  (ZESTRIL ) 20 MG tablet, Take 1 tablet by mouth once daily, Disp: 30 tablet, Rfl: 0   metFORMIN  (GLUCOPHAGE ) 500 MG tablet, TAKE 2 TABLETS BY MOUTH TWICE DAILY WITH MEALS, Disp: 360 tablet, Rfl: 0   Multiple Vitamins-Minerals (CENTRUM PO), Take 1 tablet by mouth daily.  , Disp: , Rfl:    OZEMPIC, 1 MG/DOSE, 4 MG/3ML SOPN, Inject into the skin., Disp: , Rfl:    pioglitazone  (ACTOS ) 45 MG tablet, Take 1 tablet (45 mg total) by mouth daily., Disp: 30 tablet, Rfl: 0   Probiotic Product (PROBIOTIC-10 PO), Take 1 capsule by mouth daily., Disp: , Rfl:    sildenafil (VIAGRA) 100 MG tablet, 1 tablet as needed Orally Once  a day for 10 days, Disp: , Rfl:   Social History   Tobacco Use  Smoking Status Never  Smokeless Tobacco Former   Types: Snuff   Quit date: 01/06/2000  Tobacco Comments   Grizzly    Allergies  Allergen Reactions   Other     Peanuts - choking    Peanut (Diagnostic) Hives    Other Reaction(s): Unknown   Objective:  There were no vitals filed for this visit. There is no height or weight on file to calculate BMI. Constitutional Well developed. Well nourished.  Vascular Dorsalis pedis pulses palpable bilaterally. Posterior tibial pulses palpable bilaterally. Capillary refill normal to all digits.  No cyanosis or clubbing noted. Pedal hair growth normal.  Neurologic Normal speech. Oriented to person, place, and time. Epicritic sensation to light touch grossly present bilaterally.  Dermatologic Right hallux IPJ ulceration  with fat layer exposed mild erythema noted.  No other malodor present.  No purulent drainage noted.  Does not probe down to bone.  Orthopedic: Normal joint ROM without pain or crepitus bilaterally. No visible deformities. No bony tenderness.   Radiographs: None Assessment:   No diagnosis found.  Plan:  Patient was evaluated and treated and all questions answered.  Right hallux ulceration with fat layer exposed -Minimal debridement was carried out in standard technique. -Given the amount of erythema that is present patient will benefit from doxycycline  to help with skin and soft tissue prophylaxis -Surgical shoe was dispensed for skin and soft tissue prophylaxis -Aggressive Betadine wet-to-dry dressing

## 2023-12-29 ENCOUNTER — Ambulatory Visit: Admitting: Podiatry

## 2024-01-12 ENCOUNTER — Ambulatory Visit: Admitting: Podiatry

## 2024-01-12 DIAGNOSIS — L97512 Non-pressure chronic ulcer of other part of right foot with fat layer exposed: Secondary | ICD-10-CM

## 2024-01-12 NOTE — Progress Notes (Signed)
 "  Subjective:  Patient ID: Joel JINNY Trudy Mickey., male    DOB: 20-Nov-1955,  MRN: [id number]  Chief Complaint  Patient presents with   Foot Ulcer    69 y.o. male presents with the above complaint.  Patient presents with complaint of right hallux ulceration with fat layer exposed at the IPJ.  He states it came back again.  His last A1c was 7 something.  He is a diabetic he just wants to get it evaluated  Review of Systems: Negative except as noted in the HPI. Denies N/V/F/Ch.  Past Medical History:  Diagnosis Date   Colon cancer screening    03/2014 iFOB negative.   Diabetes mellitus with complication (HCC) 09/12/10   Has mild DPN.  HbA1c 12.6% at the time of dx.   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   Hyperlipidemia    Increased prostate specific antigen (PSA) velocity 2016   ? due to cycling?  Pt chose repeat PSA instead of Biopsy and it showed lower PSA value when he had not been cycling prior to PSA check.   Obesity, Class I, BMI 30-34.9    Toe ulcer due to DM (HCC) fall 2012   Right great toe    Current Outpatient Medications:    atorvastatin  (LIPITOR) 40 MG tablet, Take 1 tablet (40 mg total) by mouth daily., Disp: 30 tablet, Rfl: 0   B-D ULTRAFINE III SHORT PEN 31G X 8 MM MISC, daily. use as directed, Disp: , Rfl:    Cyanocobalamin (VITAMIN B 12 PO), Take by mouth., Disp: , Rfl:    doxycycline  (VIBRA -TABS) 100 MG tablet, Take 1 tablet (100 mg total) by mouth 2 (two) times daily., Disp: 20 tablet, Rfl: 0   doxycycline  (VIBRA -TABS) 100 MG tablet, Take 1 tablet (100 mg total) by mouth 2 (two) times daily., Disp: 20 tablet, Rfl: 0   EPINEPHrine  0.3 mg/0.3 mL IJ SOAJ injection, INJECT 0.3 MLS INTO THE MUSCLE ONCE FOR 1 DOSE, Disp: , Rfl:    FARXIGA 10 MG TABS tablet, Take 10 mg by mouth daily., Disp: , Rfl:    glucose  blood (FREESTYLE LITE) test strip, Use to check blood sugar twice daily as directed, Disp: 100 each, Rfl: 5   Insulin  Pen Needle (B-D ULTRAFINE III SHORT PEN) 31G X 8 MM MISC, USE AS DIRECTED ONCE DAILY, Disp: 100 each, Rfl: 0   Lancet Devices (B-D LANCET DEVICE) MISC, Use as directed to check blood sugars 2 times per day, Disp: 60 each, Rfl: 5   liraglutide  (VICTOZA ) 18 MG/3ML SOPN, INJECT 1.8MG  SUBCUTANEOUSLY ONCE DAILY, Disp: 18 mL, Rfl: 2   lisinopril  (ZESTRIL ) 20 MG tablet, Take 1 tablet by mouth once daily, Disp: 30 tablet, Rfl: 0   metFORMIN  (GLUCOPHAGE ) 500 MG tablet, TAKE 2 TABLETS BY MOUTH TWICE DAILY WITH MEALS, Disp: 360 tablet, Rfl: 0   Multiple Vitamins-Minerals (CENTRUM PO), Take 1 tablet by mouth daily.  , Disp: , Rfl:    OZEMPIC, 1 MG/DOSE, 4 MG/3ML SOPN, Inject into the skin., Disp: , Rfl:    pioglitazone  (ACTOS ) 45 MG tablet, Take 1 tablet (45 mg total) by mouth daily., Disp: 30 tablet, Rfl: 0   Probiotic Product (PROBIOTIC-10 PO), Take 1 capsule by mouth daily., Disp: , Rfl:    sildenafil (VIAGRA) 100 MG tablet, 1 tablet as needed  Orally Once a day for 10 days, Disp: , Rfl:   Social History   Tobacco Use  Smoking Status Never  Smokeless Tobacco Former   Types: Snuff   Quit date: 01/06/2000  Tobacco Comments   Grizzly    Allergies  Allergen Reactions   Other     Peanuts - choking    Peanut (Diagnostic) Hives    Other Reaction(s): Unknown   Objective:  There were no vitals filed for this visit. There is no height or weight on file to calculate BMI. Constitutional Well developed. Well nourished.  Vascular Dorsalis pedis pulses palpable bilaterally. Posterior tibial pulses palpable bilaterally. Capillary refill normal to all digits.  No cyanosis or clubbing noted. Pedal hair growth normal.  Neurologic Normal speech. Oriented to person, place, and time. Epicritic sensation to light touch grossly present bilaterally.  Dermatologic Right hallux IPJ ulceration  with fat layer exposed no further erythema noted..  No other malodor present.  No purulent drainage noted.  Does not probe down to bone.  Orthopedic: Normal joint ROM without pain or crepitus bilaterally. No visible deformities. No bony tenderness.   Radiographs: None Assessment:   No diagnosis found.  Plan:  Patient was evaluated and treated and all questions answered.  Right hallux ulceration with fat layer exposed -Minimal debridement was carried out in standard technique. - No further antibiotics as the erythema has cleared up.  Continue doing Betadine wet-to-dry dressing. - Continue wearing surgical shoe. -Aggressive Betadine wet-to-dry dressing  "

## 2024-02-02 ENCOUNTER — Ambulatory Visit: Admitting: Podiatry

## 2024-02-04 ENCOUNTER — Ambulatory Visit: Admitting: Podiatry

## 2024-02-04 DIAGNOSIS — L97512 Non-pressure chronic ulcer of other part of right foot with fat layer exposed: Secondary | ICD-10-CM

## 2024-02-04 NOTE — Progress Notes (Unsigned)
 Right hallux ulcer about same

## 2024-03-03 ENCOUNTER — Ambulatory Visit: Admitting: Podiatry
